# Patient Record
Sex: Female | Born: 1935 | Race: White | Hispanic: No | Smoking: Former smoker
Health system: Southern US, Community
[De-identification: ages and names within clinical notes are randomized; demographics above are authoritative.]

## PROBLEM LIST (undated history)

## (undated) DIAGNOSIS — Z711 Person with feared health complaint in whom no diagnosis is made: Secondary | ICD-10-CM

## (undated) DIAGNOSIS — R0602 Shortness of breath: Secondary | ICD-10-CM

## (undated) DIAGNOSIS — N189 Chronic kidney disease, unspecified: Secondary | ICD-10-CM

## (undated) DIAGNOSIS — K219 Gastro-esophageal reflux disease without esophagitis: Secondary | ICD-10-CM

## (undated) DIAGNOSIS — D649 Anemia, unspecified: Secondary | ICD-10-CM

## (undated) DIAGNOSIS — F32A Depression, unspecified: Secondary | ICD-10-CM

## (undated) DIAGNOSIS — E876 Hypokalemia: Secondary | ICD-10-CM

## (undated) DIAGNOSIS — K8020 Calculus of gallbladder without cholecystitis without obstruction: Secondary | ICD-10-CM

## (undated) DIAGNOSIS — F419 Anxiety disorder, unspecified: Secondary | ICD-10-CM

## (undated) DIAGNOSIS — F329 Major depressive disorder, single episode, unspecified: Secondary | ICD-10-CM

## (undated) DIAGNOSIS — N200 Calculus of kidney: Secondary | ICD-10-CM

## (undated) DIAGNOSIS — E860 Dehydration: Secondary | ICD-10-CM

## (undated) DIAGNOSIS — F0390 Unspecified dementia without behavioral disturbance: Secondary | ICD-10-CM

## (undated) DIAGNOSIS — K5790 Diverticulosis of intestine, part unspecified, without perforation or abscess without bleeding: Secondary | ICD-10-CM

## (undated) DIAGNOSIS — I10 Essential (primary) hypertension: Secondary | ICD-10-CM

## (undated) DIAGNOSIS — E785 Hyperlipidemia, unspecified: Secondary | ICD-10-CM

## (undated) DIAGNOSIS — R1310 Dysphagia, unspecified: Secondary | ICD-10-CM

## (undated) DIAGNOSIS — J449 Chronic obstructive pulmonary disease, unspecified: Secondary | ICD-10-CM

## (undated) HISTORY — DX: Diverticulosis of intestine, part unspecified, without perforation or abscess without bleeding: K57.90

## (undated) HISTORY — DX: Calculus of gallbladder without cholecystitis without obstruction: K80.20

## (undated) HISTORY — PX: ABDOMINAL HYSTERECTOMY: SHX81

---

## 2002-01-29 ENCOUNTER — Encounter: Payer: Self-pay | Admitting: Emergency Medicine

## 2002-01-29 ENCOUNTER — Emergency Department (HOSPITAL_COMMUNITY): Admission: EM | Admit: 2002-01-29 | Discharge: 2002-01-29 | Payer: Self-pay | Admitting: Emergency Medicine

## 2003-08-02 ENCOUNTER — Ambulatory Visit (HOSPITAL_COMMUNITY): Admission: RE | Admit: 2003-08-02 | Discharge: 2003-08-02 | Payer: Self-pay | Admitting: Pulmonary Disease

## 2003-08-10 ENCOUNTER — Ambulatory Visit (HOSPITAL_COMMUNITY): Admission: RE | Admit: 2003-08-10 | Discharge: 2003-08-10 | Payer: Self-pay | Admitting: Pulmonary Disease

## 2003-12-22 ENCOUNTER — Emergency Department (HOSPITAL_COMMUNITY): Admission: EM | Admit: 2003-12-22 | Discharge: 2003-12-22 | Payer: Self-pay | Admitting: Emergency Medicine

## 2004-05-06 ENCOUNTER — Emergency Department (HOSPITAL_COMMUNITY): Admission: EM | Admit: 2004-05-06 | Discharge: 2004-05-06 | Payer: Self-pay | Admitting: Emergency Medicine

## 2004-09-04 ENCOUNTER — Ambulatory Visit (HOSPITAL_COMMUNITY): Admission: RE | Admit: 2004-09-04 | Discharge: 2004-09-04 | Payer: Self-pay | Admitting: Pulmonary Disease

## 2004-10-04 ENCOUNTER — Emergency Department (HOSPITAL_COMMUNITY): Admission: EM | Admit: 2004-10-04 | Discharge: 2004-10-04 | Payer: Self-pay | Admitting: Emergency Medicine

## 2004-12-12 ENCOUNTER — Ambulatory Visit (HOSPITAL_COMMUNITY): Admission: RE | Admit: 2004-12-12 | Discharge: 2004-12-12 | Payer: Self-pay | Admitting: Pulmonary Disease

## 2005-01-07 ENCOUNTER — Ambulatory Visit (HOSPITAL_COMMUNITY): Admission: RE | Admit: 2005-01-07 | Discharge: 2005-01-07 | Payer: Self-pay | Admitting: Pulmonary Disease

## 2005-02-21 ENCOUNTER — Ambulatory Visit: Payer: Self-pay | Admitting: Orthopedic Surgery

## 2005-03-11 ENCOUNTER — Ambulatory Visit: Payer: Self-pay | Admitting: Orthopedic Surgery

## 2005-03-14 ENCOUNTER — Inpatient Hospital Stay (HOSPITAL_COMMUNITY): Admission: EM | Admit: 2005-03-14 | Discharge: 2005-03-22 | Payer: Self-pay | Admitting: Emergency Medicine

## 2005-03-18 ENCOUNTER — Encounter: Payer: Self-pay | Admitting: Pulmonary Disease

## 2005-03-19 HISTORY — PX: ESOPHAGOGASTRODUODENOSCOPY: SHX1529

## 2005-03-20 ENCOUNTER — Ambulatory Visit: Payer: Self-pay | Admitting: *Deleted

## 2005-03-22 ENCOUNTER — Ambulatory Visit: Payer: Self-pay | Admitting: Internal Medicine

## 2005-04-04 ENCOUNTER — Ambulatory Visit: Payer: Self-pay | Admitting: Orthopedic Surgery

## 2005-04-12 ENCOUNTER — Ambulatory Visit (HOSPITAL_COMMUNITY): Admission: RE | Admit: 2005-04-12 | Discharge: 2005-04-12 | Payer: Self-pay | Admitting: Orthopedic Surgery

## 2005-04-12 ENCOUNTER — Ambulatory Visit: Payer: Self-pay | Admitting: Orthopedic Surgery

## 2005-04-15 ENCOUNTER — Ambulatory Visit: Payer: Self-pay | Admitting: Orthopedic Surgery

## 2005-05-06 ENCOUNTER — Ambulatory Visit: Payer: Self-pay | Admitting: Orthopedic Surgery

## 2005-06-26 ENCOUNTER — Ambulatory Visit: Payer: Self-pay | Admitting: Orthopedic Surgery

## 2005-09-12 ENCOUNTER — Ambulatory Visit: Payer: Self-pay | Admitting: Orthopedic Surgery

## 2006-05-27 HISTORY — PX: CYSTOSCOPY: SUR368

## 2006-09-24 ENCOUNTER — Inpatient Hospital Stay (HOSPITAL_COMMUNITY): Admission: AD | Admit: 2006-09-24 | Discharge: 2006-09-29 | Payer: Self-pay | Admitting: Internal Medicine

## 2007-01-03 ENCOUNTER — Emergency Department (HOSPITAL_COMMUNITY): Admission: EM | Admit: 2007-01-03 | Discharge: 2007-01-03 | Payer: Self-pay | Admitting: Emergency Medicine

## 2007-01-08 ENCOUNTER — Inpatient Hospital Stay (HOSPITAL_COMMUNITY): Admission: EM | Admit: 2007-01-08 | Discharge: 2007-01-13 | Payer: Self-pay | Admitting: Emergency Medicine

## 2007-02-06 ENCOUNTER — Observation Stay (HOSPITAL_COMMUNITY): Admission: RE | Admit: 2007-02-06 | Discharge: 2007-02-07 | Payer: Self-pay | Admitting: Urology

## 2007-02-06 ENCOUNTER — Ambulatory Visit: Payer: Self-pay | Admitting: Cardiology

## 2009-06-10 ENCOUNTER — Emergency Department (HOSPITAL_COMMUNITY): Admission: EM | Admit: 2009-06-10 | Discharge: 2009-06-10 | Payer: Self-pay | Admitting: Emergency Medicine

## 2009-06-22 ENCOUNTER — Inpatient Hospital Stay (HOSPITAL_COMMUNITY): Admission: EM | Admit: 2009-06-22 | Discharge: 2009-06-26 | Payer: Self-pay | Admitting: Emergency Medicine

## 2010-06-16 ENCOUNTER — Encounter: Payer: Self-pay | Admitting: Orthopedic Surgery

## 2010-08-12 LAB — CBC
HCT: 30.3 % — ABNORMAL LOW (ref 36.0–46.0)
HCT: 34.6 % — ABNORMAL LOW (ref 36.0–46.0)
MCHC: 33.6 g/dL (ref 30.0–36.0)
MCV: 84.8 fL (ref 78.0–100.0)
MCV: 85.7 fL (ref 78.0–100.0)
Platelets: 281 10*3/uL (ref 150–400)
Platelets: 328 10*3/uL (ref 150–400)
RBC: 4.18 MIL/uL (ref 3.87–5.11)
RDW: 14.5 % (ref 11.5–15.5)
WBC: 6.9 10*3/uL (ref 4.0–10.5)
WBC: 8.6 10*3/uL (ref 4.0–10.5)

## 2010-08-12 LAB — GLUCOSE, CAPILLARY
Glucose-Capillary: 104 mg/dL — ABNORMAL HIGH (ref 70–99)
Glucose-Capillary: 134 mg/dL — ABNORMAL HIGH (ref 70–99)
Glucose-Capillary: 149 mg/dL — ABNORMAL HIGH (ref 70–99)
Glucose-Capillary: 79 mg/dL (ref 70–99)
Glucose-Capillary: 82 mg/dL (ref 70–99)
Glucose-Capillary: 85 mg/dL (ref 70–99)
Glucose-Capillary: 87 mg/dL (ref 70–99)
Glucose-Capillary: 88 mg/dL (ref 70–99)
Glucose-Capillary: 97 mg/dL (ref 70–99)

## 2010-08-12 LAB — BASIC METABOLIC PANEL
BUN: 10 mg/dL (ref 6–23)
BUN: 12 mg/dL (ref 6–23)
BUN: 14 mg/dL (ref 6–23)
BUN: 26 mg/dL — ABNORMAL HIGH (ref 6–23)
CO2: 22 mEq/L (ref 19–32)
CO2: 22 mEq/L (ref 19–32)
Calcium: 8.7 mg/dL (ref 8.4–10.5)
Calcium: 9.6 mg/dL (ref 8.4–10.5)
Chloride: 110 mEq/L (ref 96–112)
Chloride: 111 mEq/L (ref 96–112)
Creatinine, Ser: 1.48 mg/dL — ABNORMAL HIGH (ref 0.4–1.2)
Creatinine, Ser: 2.96 mg/dL — ABNORMAL HIGH (ref 0.4–1.2)
GFR calc Af Amer: 19 mL/min — ABNORMAL LOW (ref >60)
GFR calc non Af Amer: 30 mL/min — ABNORMAL LOW (ref >60)
GFR calc non Af Amer: 34 mL/min — ABNORMAL LOW (ref >60)
Glucose, Bld: 83 mg/dL (ref 70–99)
Glucose, Bld: 95 mg/dL (ref 70–99)
Potassium: 4.2 mEq/L (ref 3.5–5.1)
Potassium: 4.5 mEq/L (ref 3.5–5.1)
Potassium: 4.5 mEq/L (ref 3.5–5.1)

## 2010-08-12 LAB — URINE MICROSCOPIC-ADD ON

## 2010-08-12 LAB — POCT CARDIAC MARKERS
CKMB, poc: 1.5 ng/mL (ref 1.0–8.0)
Myoglobin, poc: 357 ng/mL (ref 12–200)

## 2010-08-12 LAB — URINE CULTURE: Colony Count: NO GROWTH

## 2010-08-12 LAB — DIFFERENTIAL
Basophils Absolute: 0 10*3/uL (ref 0.0–0.1)
Basophils Relative: 0 % (ref 0–1)
Eosinophils Absolute: 0 10*3/uL (ref 0.0–0.7)
Eosinophils Absolute: 0 10*3/uL (ref 0.0–0.7)
Eosinophils Relative: 0 % (ref 0–5)
Eosinophils Relative: 0 % (ref 0–5)
Lymphocytes Relative: 25 % (ref 12–46)
Lymphs Abs: 2.1 10*3/uL (ref 0.7–4.0)
Lymphs Abs: 2.4 10*3/uL (ref 0.7–4.0)
Monocytes Relative: 10 % (ref 3–12)
Monocytes Relative: 9 % (ref 3–12)
Neutro Abs: 5.7 10*3/uL (ref 1.7–7.7)
Neutrophils Relative %: 66 % (ref 43–77)

## 2010-08-12 LAB — URINALYSIS, ROUTINE W REFLEX MICROSCOPIC
Bilirubin Urine: NEGATIVE
Glucose, UA: NEGATIVE mg/dL
Ketones, ur: NEGATIVE mg/dL
Ketones, ur: NEGATIVE mg/dL
Nitrite: POSITIVE — AB
Nitrite: NEGATIVE
Protein, ur: 100 mg/dL — AB
Specific Gravity, Urine: 1.02 (ref 1.005–1.030)
Urobilinogen, UA: 0.2 mg/dL (ref 0.0–1.0)
Urobilinogen, UA: 0.2 mg/dL (ref 0.0–1.0)

## 2010-08-12 LAB — PHOSPHORUS: Phosphorus: 3.1 mg/dL (ref 2.3–4.6)

## 2010-10-09 NOTE — Consult Note (Signed)
NAME:  [name], [name]                ACCOUNT NO.:  [id number]   MEDICAL RECORD NO.:  [id number]          PATIENT TYPE:  INP   LOCATION:  A225                          FACILITY:  [facility]   PHYSICIAN:  Gerrit Friends. Dietrich Pates, MD, FACCDATE OF BIRTH:  [birth year]   DATE OF CONSULTATION:  02/06/2007  DATE OF DISCHARGE:                                 CONSULTATION   REFERRING PHYSICIAN:  Dr. Felecia Shelling.   PRIMARY CARDIOLOGIST:  Previously Dr. Dorethea Clan.   HISTORY OF PRESENT ILLNESS:  A 77 year old woman with known conduction  system disease referred for evaluation of brady arrhythmias prior to an  subsequent to extraction of a ureteral stone.   [name] [name] was evaluated 2 years ago in the setting of pulmonary  embolism.  She was found to have bifascicular block at that time and  developed increased AV block in the hospital.  There was no prior  cardiac history.  An echocardiogram was unremarkable.  Myocardial  infarction was ruled out.  Ultimately, her rhythm stabilized, and she  was not thought to require permanent pacing.   Since that time, she has resided in of [facility]. She walks with assistance  with a walker.  She has complained of some dizziness, particularly since  being told of diabetes in recent months, but has had no syncopal spells.   She was recently admitted with a urinary tract infection and found to  have obstruction related to a ureteral stone.  A stent was placed.  She  was brought in today for stone extraction.  She developed second-degree  AV block with 2:1 conduction prior to her procedure.  Atropine was given  with improvement in her rhythm.  She subsequently had spinal anesthesia  and surgery. Mobitz type 1 second-degree AV block was noted during the  procedure and in the recovery room.  There was relative hypotension at  times. At present, the patient feels well.   PAST MEDICAL HISTORY:  Notable for dementia, depression, COPD and CVA.  Prior surgery includes an appendectomy,  bilateral cataract extraction,  remote total abdominal hysterectomy and recent arthroscopic knee  surgery.  She has had hypertension, GERD and arthritic discomfort,  particularly in the knees.  Pulmonary embolus occurred 2 years ago as  noted above.   CURRENT MEDICATIONS:  1. Atrovent and Proventil by nebulizer.  2. Protonix 40 mg daily.  3. Lexapro 10 mg daily.  4. Lisinopril 20 mg daily.  5. Oxybutynin 10 mg daily.  6. Senokot 2 tablets daily.  7. Namenda 10 mg daily.  8. Aricept 10 mg daily.  9. Endocet 25 mg b.i.d.  10.Lantus insulin 10 units daily.  11.Septra DS b.i.d.   SOCIAL HISTORY:  No history of alcohol use; there is no history of left  tobacco use.   FAMILY HISTORY:  No prominent cardiac disease.   REVIEW OF SYSTEMS:  Notable for dry skin with some near decubitus ulcers  due to prolonged bedrest, GERD symptoms, incontinence of stool, a remote  history of GI bleed, a diabetic diet, a history of urinary incontinence,  anemia, the need for corrective lenses for near  vision and upper  dentures.  All other systems reviewed and are negative.   PHYSICAL EXAMINATION:  Pleasant but confused woman who does know that  she is in the hospital in no acute distress.  The temperature is 97.6, heart rate 74 and regular, respirations 16,  blood pressure 120/70, O2 saturation 97% on 2 liters.  Weight is 86.3  kg.  HEENT:  EOMs full; pupils equal, round, and react to light; normal oral  mucosa.  NECK:  No jugular venous distention; no carotid bruits.  ENDOCRINE:  No thyromegaly.  HEMATOPOIETIC:  No adenopathy.  LUNGS:  Clear.  SKIN:  No significant lesions.  CARDIAC:  Normal first and second heart sounds; some irregularity;  fourth heart sound present.  ABDOMEN:  Soft and nontender; normal bowel sounds; no organomegaly.  EXTREMITIES:  1/2+ ankle edema; distal pulses intact.  NEUROMUSCULAR:  Symmetric strength and tone; normal cranial nerves;  tremor, predominately of the upper  extremities.   EKG shows normal sinus rhythm, first-degree AV block and right bundle  branch block.   Echocardiogram was last performed in August of this year. The indication  was pedal edema and cardiomegaly. There was moderate LVH with preserved  left ventricular systolic function. No significant valvular  abnormalities were identified.   Other laboratory notable for normal CBC, normal chemistry profile except  for a glucose of 130 and a creatinine of 1.35 and a normal calcium.  Chest x-ray in August showed cardiomegaly and a decrease in lung  volumes.  The radiologist thought that mild pulmonary edema was present.   IMPRESSION:  [name] [name] has conduction system disease with significant  AV block during the stress of surgery. Preprocedure medication, pain or  other factors could have contributed to increased vagal tone and the  emergence of a heart rate of 30.  In the absence of suggestive symptoms  when she is not under stress, I am not inclined to recommend pacing at  this time, although she may well require it in the future.  She can be  observed overnight.  Atropine can be administered for recurrent  bradycardia.  If she has no additional significant arrhythmias, she can  be  discharged in the morning.  I discussed with her family the possibility  of additional conduction system problems in the future and the need to  call me should she develop significant dizziness, or loss of  consciousness or other cardiac issues.  Thank so much for allowing our  group to become reinvolved in this nice woman's care.      Gerrit Friends. Dietrich Pates, MD, [facility]  Electronically Signed     RMR/MEDQ  D:  02/06/2007  T:  02/06/2007  Job:  [id number]

## 2010-10-09 NOTE — H&P (Signed)
NAME:  [name] [name], [name] [name]                ACCOUNT NO.:  [id number]   MEDICAL RECORD NO.:  [id number]          PATIENT TYPE:  INP   LOCATION:  A318                          FACILITY:  [facility]   PHYSICIAN:  Tesfaye D. Felecia Shelling, MD   DATE OF BIRTH:  [birth year]   DATE OF ADMISSION:  01/07/2007  DATE OF DISCHARGE:  LH                              HISTORY & PHYSICAL   CHIEF COMPLAINT:  Fever.   HISTORY OF PRESENT ILLNESS:  This is 77 year old female resident of  nursing home, who was brought to emergency room with fever of 102  degrees Fahrenheit.  The patient was recently found to have E. coli UTI  which was resistant to Cipro.  The patient was started on Septra DS one  tablet p.o. b.i.d.  She has been in her usual state of health until  yesterday afternoon when the patient started spiking fever.  She was  brought to emergency room and the patient was evaluated.  She was  started on Rocephin and gentamicin.  The patient was admitted for  further treatment.  The patient is a poor historian and is unable to  give details of history.  She does not remember much about was happened  yesterday.   REVIEW OF SYSTEMS:  The patient feels better now.  No headache, any  chills, cough, nausea, vomiting, abdominal pain, dysuria, urgency or  frequency of urination.   PAST MEDICAL HISTORY:  1. Chronic obstructive pulmonary disease.  2. Hypertension.  3. Pneumonia.  4. History of pulmonary embolism.  5. Dementia.  6. Depression disorder.  7. Anemia.  8. History of gastroesophageal reflux disease.  9. History of second-degree atrioventricular block.  10.History of right knee chronic pain.   CURRENT MEDICATIONS:  1. Atrovent and Proventil nebulizer q.6h. p.r.n.  2. Protonix 40 mg p.o. daily.  3. Lexapro 10 mg p.o. daily.  4. Lisinopril 20 mg p.o. daily.  5. Oxybutynin 10 mg p.o. daily.  6. Senokot two tablets p.o. daily.  7. Namenda 10 mg p.o. daily.  8. Aricept 10 mg p.o. daily.  9. Endocet 25 mg  b.i.d.  10.Lantus insulin 10 units subcu nightly.  11.Septra DS one tablet b.i.d.  12.Accu-Chek with sliding scale coverage.   SOCIAL HISTORY:  The patient is currently resident of [facility].  No history of alcohol, tobacco or substance abuse.   PHYSICAL EXAMINATION:  GENERAL APPEARANCE:  The patient is alert, awake  and sick looking with.  VITAL SIGNS:  On admission, temperature 101.8 degrees Fahrenheit, blood  pressure 186/74, pulse 127, respiratory rate 28.  HEENT:  Pupils are equal and reactive.  NECK:  Supple.  CHEST:  Decreased air entry, bilateral rhonchi.  CARDIOVASCULAR:  First and second heart sound heard.  No murmur, no  gallop.  ABDOMEN:  Soft and lax.  Bowel sounds positive.  No mass or  organomegaly.  EXTREMITIES:  No leg edema.   ADMISSION LABORATORY DATA:  CBC with WBC 13.8, hemoglobin 12.5,  hematocrit 37.3, platelet 296.  BMP with sodium 131, potassium 4.4,  chloride 99, carbon dioxide 20, glucose 296, BUN  26, creatinine 3.19 and  calcium 8.5.   ASSESSMENT:  1. Fever, could be secondary to urinary tract infection, however, will      rule out sepsis.  2. Acute renal insufficiency, etiology not clear, probably may be      secondary to nonsteroidal anti-inflammatory medications.  3. History of chronic obstructive pulmonary disease.  4. Dementia.  5. Depression disorder.  6. Hypertension.  7. Diabetes mellitus.   PLAN:  Will continue the patient on IV Rocephin.  Will __________  gentamicin.  Will do renal ultrasound.  We will continue to monitor her  BMP and intake and output.  Will do nephrology consult for further  evaluation of her renal insufficiency.      Tesfaye D. Felecia Shelling, MD  Electronically Signed     TDF/MEDQ  D:  01/08/2007  T:  01/08/2007  Job:  [id number]

## 2010-10-09 NOTE — Op Note (Signed)
NAME:  [name] [name], [name] [name]                ACCOUNT NO.:  [id number]   MEDICAL RECORD NO.:  [id number]          PATIENT TYPE:  INP   LOCATION:  A318                          FACILITY:  [facility]   PHYSICIAN:  Ky Barban, M.D.DATE OF BIRTH:  04-04-1929   DATE OF PROCEDURE:  DATE OF DISCHARGE:                               OPERATIVE REPORT   PREOPERATIVE DIAGNOSES:  1. Renal failure.  2. Right ureteral and renal calculus.   POSTOPERATIVE DIAGNOSES:  1. Renal failure.  2. Right ureteral and renal calculus.   PROCEDURES:  1. Cystoscopy.  2. Right retrograde pyelogram.  3. Insertion of double-J stent.   ANESTHESIA:  Spinal.   PROCEDURE:  The patient under spinal anesthesia in lithotomy position,  after the usual prep and drape, a #25 cystoscope was introduced into the  bladder.  It was inspected.  The right ureteral orifice was catheterized  with open-end catheter.  I tried to pass the catheter but it hangs up  just below the common iliac vessels so I decided to inject the dye.  The  dye is injected and there is a filling defect.  There is a stone at that  point.  The dye goes up above the filling defect.  The ureter is  markedly dilated.  A guidewire is passed and it went up into the renal  pelvis over the guidewire.  I was able to slide the catheter in the  renal pelvis.  The guidewire is removed.  I can see hydronephrotic drip.  Guidewire is reintroduced and the open-end catheter is removed.  Over  the guidewire 5 x 24 cm double-J stent is positioned, no string is  attached, and it was positioned between the renal pelvis and the  bladder.  Guidewire is removed.  The patient left the operating room in  satisfactory condition.      Ky Barban, M.D.  Electronically Signed     MIJ/MEDQ  D:  01/12/2007  T:  01/13/2007  Job:  [id number]

## 2010-10-09 NOTE — Consult Note (Signed)
NAMENASTEHO, GLANTZ                ACCOUNT NO.:  [id number]   MEDICAL RECORD NO.:  [id number]          PATIENT TYPE:  INP   LOCATION:  A318                          FACILITY:  [facility]   PHYSICIAN:  Cyndra Numbers, MD DATE OF BIRTH:  [birth year]   DATE OF CONSULTATION:  01/08/2007  DATE OF DISCHARGE:                                 CONSULTATION   REFERRING PHYSICIAN:  Tesfaye D. Felecia Shelling, MD   [name] [name] is a 77 year old white female on whom we are asked to  consult for evaluation of renal insufficiency.   This is a patient who is a long-term resident of a nursing home with  chronic apparently Alzheimer's-type dementia, who was admitted to the  hospital with nausea and vomiting, poor p.o. intake and back pain for  unclear duration.  The patient is unable to give much history.  She  states that she is feeling better today, but is unable to quantitate  what her problems were prior to admission.   The patient was found on admission to have a creatinine of 3.19.  Her  baseline creatinine appears to be in the range of 1.5-1.6 as of May of  this year.   Apparently prior to admission, he was being treated for a urinary tract  infection with Bactrim and was also on Indocin long term, indication  unknown.  She also was maintained on an Ace inhibitor prior to  admission.  Note is made that her urinary specific gravity prior to  admission was greater than 1.030, although her blood pressure was well  preserved in the emergency room.   The patient has not received radiocontrast dye studies, aminoglycoside  antibiotics or other easily identified nephrotoxin except for her  Indocin, ACE inhibitor, and Bactrim use as stated above.   PAST MEDICAL HISTORY:  1. Chronic Alzheimer's-type dementia.  2. COPD.  3. History of acute pulmonary embolus occurring back in October 2006,      at which time she underwent IVC filter placement after bleeding on      acute heparin therapy.  4. History of  secondary AV block.  5. Depression.  6. Anemia.  7. Renal insufficiency.   The details of the patient's anemia are unclear.  Her hemoglobin on  admission this time was 12.5.   ALLERGIES:  According to the patient's emergency room records, she has  no known drug allergies.   MEDICATIONS AT HOME:  1. Protonix 40 mg a day.  2. Lexapro 10 mg daily.  3. Lisinopril 20 mg daily.  4. Oxybutynin 5 mg once a day.  5. Senokot 2 tablets once a day.  6. Namenda 1 tablet twice daily.  7. Albuterol inhaler.  8. Aricept 10 mg once a day.  9. Indocin 25 mg twice a day.  10.Cipro 250 mg twice a day prior to January 04, 2007.  11.Bactrim DS one orally twice daily.  12.Pyridium at an unknown dose.  13.O2 as needed.   PERSONAL HISTORY:  No tobacco or alcohol use recently.  She is a nursing  home resident, and she is unable to tell me  whether she smoked or drank  in the past.   FAMILY HISTORY:  Unobtainable.   REVIEW OF SYSTEMS:  At this time, this is largely unobtainable.  The  patient states that she is not having any breathing difficulties.  She  states that she is not hurting in her back.  She states she is not  hurting anywhere.  She is unable to quantitate her nausea and vomiting  but states that she is not sick this morning.   PHYSICAL EXAMINATION:  GENERAL:  At this time, reveals a well-developed,  moderately obese white female, actually appearing somewhat younger than  her stated age in no acute distress.  VITAL SIGNS:  This morning revealed blood pressure 114/59.  Respirations  were unlabored at 26.  Pulse is 92.  Temperature is 98.5.  HEENT Exam:  Revealed an atraumatic cranium.  The patient was anicteric.  Buccal mucosa was only mildly dry.  Funduscopic exam was not performed.  NECK:  Supple without JVD or lymphadenopathy.  No carotid bruits.  CHEST:  Clear to auscultation and percussion.  She had a regular heart  with no murmurs, gallops, or rubs appreciated.  ABDOMEN:  Soft.   Bowel sounds were normal.  She did have some tenderness  to palpation deeply in the upper abdomen, right upper quadrant more than  left upper quadrant.  GENITOURINARY:  No bladder distention was percussible.  She had a Foley  catheter in place.  EXTREMITIES:  Remarkable for no appreciable edema.  Feet were warm with  positive palpable pulses.  NEUROLOGIC:  The patient was seen to move all four, but she had a  preference to moving her left side.  She seemed to have no definable  weakness on her right extremities, however.  Her mentation was oriented  to hospital only.  She recognized me as the doctor, but did not know the  day or why she was in the hospital.   LABORATORY DATA:  Reviewed and essentially as stated above.   ASSESSMENT/PLAN:  1. Acute renal failure which is multifactorial including volume      contraction, Indocin use, ACE inhibitor use, and Bactrim use.      Expect she will have improvement and probable resolution of acute      renal failure with clinical stabilization and discontinuation of      nephrotoxic medications.  2. Nausea and vomiting with plus/minus right upper quadrant tenderness      on physical exam.  We will check LFTs and might consider the      possibility of right upper quadrant biliary ultrasound.  3. Organic brain syndrome with dementia.  4. History of pulmonary embolus, status post IVC filter.  The patient      is currently on Lovenox therapy.  5. History of GI bleed.  6. History of COPD.   I would like to thank Dr. Felecia Shelling very kindly for asking [place] to see this  patient, and we will continue to follow during her hospitalization and  make further suggestion.           ______________________________  Cyndra Numbers, MD     WRB/MEDQ  D:  01/08/2007  T:  01/09/2007  Job:  [id number]   cc:   Tesfaye D. Felecia Shelling, MD  Fax: [phone]   [facility]

## 2010-10-09 NOTE — Discharge Summary (Signed)
NAME:  [name] [name], [name] [name]                ACCOUNT NO.:  [id number]   MEDICAL RECORD NO.:  [id number]          PATIENT TYPE:  INP   LOCATION:  A318                          FACILITY:  [facility]   PHYSICIAN:  Tesfaye D. Felecia Shelling, MD   DATE OF BIRTH:  Apr 19, 1929   DATE OF ADMISSION:  01/07/2007  DATE OF DISCHARGE:  08/19/2008LH                               DISCHARGE SUMMARY   DISCHARGE DIAGNOSES:  1. Urinary tract infection.  2. Right ureteral calculi with hydronephrosis.  3. Acute renal failure secondary to the above.  4. History of chronic obstructive pulmonary disease.  5. Hypertension.  6. Dementia.  7. History of depression disorder.  8. History of pulmonary embolism.  9. History of pneumonia.  10.History of right knee chronic pain.  11.History of second-degree atrioventricular block.  12.History of gastroesophageal reflux disease.   DISCHARGE MEDICATIONS:  1. Protonix 40 mg p.o. daily.  2. Lexapro 10 mg p.o. daily.  3. Senna two tablets p.o. daily.  4. Namenda 10 mg b.i.d.  5. Aricept 10 mg daily.  6. Ditropan 10 mg p.o. daily.  7. Atrovent and Proventil nebulizer q.4 h. as needed.  8. Norvasc 5 mg daily  9. Darvocet-N 100 one tablet p.o. q.6 h. p.r.n. for pain.  10.Xanax 0.25 mg p.o. q.6 h. p.r.n.  11.Ambien 5 mg at bedtime p.r.n.  12.Lantus insulin 10 units subcu at bedtime.  13.Accu-Chek with sliding-scale coverage.   DISPOSITION:  The patient will be discharged to [facility] in  stable condition.   HOSPITAL COURSE:  This is a 77 year old female patient with a history of  multiple medical illnesses, who was brought to the emergency room with a  fever of 102 degrees Fahrenheit.  The patient was previously found for  E. coli UTI in the nursing home, for which she was being treated with  Septra DS one tablet p.o. b.i.d..  However, the patient continued to  spike fever.  She was evaluated and was admitted from the emergency  room.  The patient received IV Rocephin.   During the evaluation, the  patient was found to have acute renal failure.  Ultrasound of the  kidneys showed right ureteral calculi with hydronephrosis.  Urology  consult and nephrology consult were done.  The patient  underwent cystoscopy with a right ureteral stent placement.  The patient  improved.  Her renal function was also significantly improved.  On  discharge, her BUN was 24 with a creatinine of 2.08.  The patient is  back to her baseline.  She will be discharged back to a nursing home to  continue her regular treatment.      Tesfaye D. Felecia Shelling, MD  Electronically Signed     TDF/MEDQ  D:  01/13/2007  T:  01/13/2007  Job:  [id number]

## 2010-10-09 NOTE — Procedures (Signed)
NAME:  [name] [name], [name] [name]                ACCOUNT NO.:  [id number]   MEDICAL RECORD NO.:  [id number]          PATIENT TYPE:  INP   LOCATION:  A318                          FACILITY:  [facility]   PHYSICIAN:  Bevelyn Buckles. Bensimhon, MDDATE OF BIRTH:  [birth year]   DATE OF PROCEDURE:  01/09/2007  DATE OF DISCHARGE:                                ECHOCARDIOGRAM   INDICATIONS:  Lower extremity edema and cardiomegaly on chest x-ray.   This was a technically difficult study due to suboptimal sound wave  transmission.   Measurements:  LV diastole 3.5 cm.  LV systole 2.8 cm.  Aortic root 3.5  cm.  Left atrium 3.7 cm.  Septum is 2.2 cm.  Posterior wall is 1.7 cm.   Left ventricle:  There was normal LV size and function with ejection  fraction of 60-65%.  There is moderate concentric left ventricular  hypertrophy with moderate to severe focal basilar septal hypertrophy.  Study was inadequate for regional wall motion abnormalities.   Right ventricle:  Grossly normal.   Left atrium:  Normal size.   Right atrium:  Grossly normal.   Aortic valve:  Trileaflet was mildly thickened, but there was good  leaflet excursion.  There is no significant aortic stenosis.  No aortic  insufficiency.  There was a mean gradient across the aortic valve of 6  mmHg.   Mitral valve:  Structurally normal.  There is no mitral valve prolapse  visualized.  No significant mitral regurgitation or mitral stenosis.   Tricuspid valve was grossly normal.  There is trivial tricuspid  regurgitation.   Pulmonic valve was not well-visualized.  There is no evidence of  pulmonic insufficiency or pulmonic stenosis.   Pericardium:  There was no evidence of pericardial effusion.  There was  small epicardial fat pad.   CONCLUSION:  1. Normal left ventricular function.  Ejection fraction of 60-65%.  2. Moderate left ventricular hypertrophy with moderate to severe basal      septal hypertrophy.  3. Mildly thickened aortic valve without  significant aortic stenosis.      Bevelyn Buckles. Bensimhon, MD  Electronically Signed     DRB/MEDQ  D:  01/09/2007  T:  01/11/2007  Job:  [id number]

## 2010-10-09 NOTE — Op Note (Signed)
NAME:  [name], [name]                ACCOUNT NO.:  [id number]   MEDICAL RECORD NO.:  [id number]          PATIENT TYPE:  INP   LOCATION:  A225                          FACILITY:  [facility]   PHYSICIAN:  Ky Barban, M.D.DATE OF BIRTH:  1929/04/10   DATE OF PROCEDURE:  02/06/2007  DATE OF DISCHARGE:                               OPERATIVE REPORT   PREOPERATIVE DIAGNOSIS:  Right ureteral calculus.   POSTOPERATIVE DIAGNOSIS:  Right ureteral calculus.   PROCEDURE:  Cystoscopy, right ureteroscopic stone basket with holmium  laser lithotripsy, insertion of double-J stent size 5 French 24 cm - no  string attached.   ANESTHESIA:  Spinal.   PROCEDURE:  The patient under spinal anesthesia and placed in lithotomy  position, usual prep and drape.  A #25 cystoscope introduced into the  bladder.  The previously placed stent was grabbed with the rigid forceps  and brought out near the meatus.  At that point I inserted a guidewire  through the stent, passed up into the renal pelvis and the stent was  removed.  Guidewire was secured to the drapes, and short rigid  ureteroscope was introduced under direct vision alongside the guidewire,  went to the level of the stone.  Stone was visualized, and then using a  laser fiber, the stone was pulverized into smaller pieces, and then this  pieces were engaged in a basket.  Some of those pieces were further  broken with the engaged basket, and they were brought out completely on  several passes of the ureteroscope.  At the end, the entire ureter was  inspected and looks fine.  There was no residual stone.  The  ureteroscope was removed.  Cystoscope was introduced over the guidewire,  and a double-J stent was passed over the guidewire and positioned  between the renal pelvis and the bladder.  Guidewire and the string is  removed.  All the instruments were removed.  Stent was in good position.  The patient left the operating room in satisfactory  condition.      Ky Barban, M.D.  Electronically Signed     MIJ/MEDQ  D:  02/06/2007  T:  02/07/2007  Job:  [id number]

## 2010-10-09 NOTE — H&P (Signed)
NAME:  [name] [name], [name] [name]                ACCOUNT NO.:  [id number]   MEDICAL RECORD NO.:  [id number]          PATIENT TYPE:  AMB   LOCATION:                                FACILITY:  [facility]   PHYSICIAN:  Ky Barban, M.D.DATE OF BIRTH:  05-Feb-1929   DATE OF ADMISSION:  DATE OF DISCHARGE:  LH                              HISTORY & PHYSICAL   HISTORY:  This 77 year old female was in the hospital last month,  underwent a placement of double-J stent on the right side for ureteral  calculus which was causing obstruction, also renal failure.   HISTORY AND PHYSICAL:  Please use her recent history and physical for  his procedure.   ASSESSMENT/PLAN:  I have advised her to come as outpatient to undergo  stone basket chromium laser lithotripsy.  I had a meeting with her  family, daughter and sons came.  So I told them what is the problem,  that she has a stone in the ureter and also in the right kidney but I  need to remove the stone in the ureter because most likely that is only  causing obstruction.  The renal stone may not be causing obstruction  after I remove this, then we will see.  The procedure of stone basket is  discussed in detail, its possible complications especially ureteral  perforation leading to open surgery.  They understand and will go ahead  and proceed.      Ky Barban, M.D.  Electronically Signed     MIJ/MEDQ  D:  02/05/2007  T:  02/05/2007  Job:  [id number]

## 2010-10-09 NOTE — Consult Note (Signed)
NAME:  [name], [name]                ACCOUNT NO.:  [id number]   MEDICAL RECORD NO.:  [id number]          PATIENT TYPE:  INP   LOCATION:  A318                          FACILITY:  [facility]   PHYSICIAN:  Ky Barban, M.D.DATE OF BIRTH:  1929/04/09   DATE OF CONSULTATION:  01/10/2007  DATE OF DISCHARGE:                                 CONSULTATION   CHIEF COMPLAINT:  Right flank pain and fever.   HISTORY:  A 77 year old female who is a resident of a local nursing  home, brought into the emergency room with high fever and right flank  pain.  CT scan showed an 8 x 10-mm stone in the right upper ureter  causing hydronephrosis.  She also has a big stone in the right kidney.  The patient is admitted for further workup and management.  I was called  in to see her because her creatinine was up to 3.3.   PAST MEDICAL HISTORY:  She has multiple medical problems including COPD,  hypertension, pneumonia, history of pulmonary embolism, dementia,  depression, anemia, GE reflux, second degree AV block.   MEDICATION:  Atrovent, Protonix, Lexapro, lisinopril, oxybutynin,  Senokot, Namenda, Aricept, Endocet, insulin, and Septra DS.   EXAMINATION:  Moderately-built, not in acute distress.  Blood pressure  is 180/70, temperature is normal.  ABDOMEN:  Soft, flat.  Liver, spleen, kidneys were not palpable.  CVA  tenderness 1+.  PELVIC:  Deferred.   LABORATORY DATA:  Her sodium is 133, potassium 4.6, chloride 101, CO2 is  24, glucose 138, BUN 33, creatinine 3.3.  Wbc count is 13.8, hematocrit  37.3.  Urine and blood cultures are done.  She was started on gentamicin  plus Rocephin.  Later on when she was found to have poor renal function,  gentamicin was discontinued, still on Rocephin.   IMPRESSION:  1. Right renal and right ureteral calculus.  2. Insulin-dependent diabetes.  3. Hypertension.  4. Chronic obstructive pulmonary disease.  5. Possible urinary tract infection.   Urine culture and  blood cultures have been done and as the patient is  feeling better, she is afebrile, I will wait and see if the kidney  functions improve.  If they improve by tomorrow I will hold on doing  insertion of double-J stent.  I  can do it electively on Monday, but if the kidney functions are  deteriorating then I will go ahead and do it as an emergency on Sunday.   I appreciate Dr. Felecia Shelling for letting me see this patient.      Ky Barban, M.D.  Electronically Signed     MIJ/MEDQ  D:  01/11/2007  T:  01/12/2007  Job:  [id number]   cc:   Tesfaye D. Felecia Shelling, MD  Fax: [phone]

## 2010-10-12 NOTE — Group Therapy Note (Signed)
NAME:  [name] [name], [name] [name]                ACCOUNT NO.:  [id number]   MEDICAL RECORD NO.:  [id number]          PATIENT TYPE:  INP   LOCATION:  A208                          FACILITY:  [facility]   PHYSICIAN:  Edward L. Juanetta Gosling, M.D.DATE OF BIRTH:  28-Sep-1928   DATE OF PROCEDURE:  03/16/2005  DATE OF DISCHARGE:                                   PROGRESS NOTE   PROBLEM:  Possible pulmonary embolus.   SUBJECTIVE:  [name] [name] has a new problem and that is that she has had  some very dark loose stools that at least one of which is heme positive.   OBJECTIVE:  Her exam shows a temperature is 97.4, pulse is 84, respirations  20, blood pressure 166/91, O2 saturation 94%.   Her CBC now shows her hemoglobin is 10, down from 11.9 yesterday.  Prothrombin time is 15.5. Her BUN is down to 42 with a creatinine 1.3.   ASSESSMENT:  She is, I think, having probably a GI bleed. She has dropped  her hemoglobin. My plan is to hold her anticoagulation. I am not fully  convinced that she has had a pulmonary embolus. Her lung scan was  intermediate probability, and I am more concerned about her bleeding now. I  am going to go ahead and stop her anticoagulation for the moment, hopefully  will be able to have her get a CT which will be a much better test, and then  try to decide what we can do from there.      Edward L. Juanetta Gosling, M.D.  Electronically Signed     ELH/MEDQ  D:  03/16/2005  T:  03/17/2005  Job:  [id number]

## 2010-10-12 NOTE — Procedures (Signed)
NAME:  Tokunaga, Serina                ACCOUNT NO.:  [id number]   MEDICAL RECORD NO.:  [id number]          PATIENT TYPE:  INP   LOCATION:  A208                          FACILITY:  [facility]   PHYSICIAN:  Edward L. Juanetta Gosling, M.D.DATE OF BIRTH:  03-Jul-1928   DATE OF PROCEDURE:  03/20/2005  DATE OF DISCHARGE:                                EKG INTERPRETATION   The rhythm appears to be a second degree AV block of the Wenckebach type.  There are Q wave abnormalities which are consistent but not diagnostic of  ischemia.  Abnormal electrocardiogram.      Oneal Deputy. Juanetta Gosling, M.D.  Electronically Signed     ELH/MEDQ  D:  03/21/2005  T:  03/22/2005  Job:  [id number]

## 2010-10-12 NOTE — H&P (Signed)
NAME:  [name] [name], [name] [name]                ACCOUNT NO.:  [id number]   MEDICAL RECORD NO.:  [id number]          PATIENT TYPE:  AMB   LOCATION:  DAY                           FACILITY:  [facility]   PHYSICIAN:  Vickki Hearing, M.D.DATE OF BIRTH:  [birth year]   DATE OF ADMISSION:  04/11/2005  DATE OF DISCHARGE:  LH                                HISTORY & PHYSICAL   CHIEF COMPLAINT:  Right knee pain.   HISTORY OF PRESENT ILLNESS:  She is a 77 year old female with acute onset of  right knee pain and swelling described as severe, constant, dull, aching and  present for greater than 3 months.  Any motion caused increasing pain and  she had associated signs of swelling.   She has normal development, grooming, hygiene, nutrition and body habitus  large.  She is alert and oriented x3 with  normal sensation.  Pulses are  normal.  She has no venous statis.  Normal temperature and no edema in her  peripheral vascular tree.  Skin is normal.  She has no lymph nodes that are  positive.   She has 30-110 degree range of motion in the right knee with normal strength  and stability.  She has crepitance and swelling with positive meniscal sign.  She has no contractures, atrophy, luxations or malalignment in her upper  extremities.  Her x-rays from the hospital show no fracture, but an  effusion.  She eventually went on to have MRI of the right knee which showed  a complex tear of the mid body of the anterior horn and lateral meniscus.  There was also irregularity to medial femoral condyle with complex tear of  the medial meniscus and also full thickness cartilage lost throughout the  medial compartment.   ALLERGIES:  No known drug allergies.   PAST SURGICAL HISTORY:  1.  Appendectomy.  2.  Hysterectomy.   MEDICATIONS:  She takes medications which she said she would bring to the  hospital.   FAMILY HISTORY:  Negative.   PRIMARY CARE PHYSICIAN:  Edward L. Juanetta Gosling, M.D.   SOCIAL HISTORY:  She is  widowed.  She is a housewife.  She is retired.  She  smokes 1/2 pack of cigarettes per day.  She does not drink.  She drinks two  soft drinks a day.  Education through 7th grade.   REVIEW OF SYSTEMS:  CARDIOPULMONARY:  Positive for shortness of breath.  NEUROLOGIC:  Depression.  HEENT:  Glaucoma.  Other seven systems normal  except for as described in musculoskeletal.   PHYSICAL EXAMINATION:  VITAL SIGNS:  Weight 200, pulse 72, respirations 18.   ASSESSMENT:  Medial and lateral meniscal tears, possibly avascular necrosis  of the left knee and medial femoral condyle.   PLAN:  Plan for arthroscopy, meniscectomies and address condylar lesions as  required.      Vickki Hearing, M.D.  Electronically Signed     SEH/MEDQ  D:  04/11/2005  T:  04/11/2005  Job:  [id number]   cc:   [facility]

## 2010-10-12 NOTE — Consult Note (Signed)
NAME:  [name] [name], [name] [name]                ACCOUNT NO.:  [id number]   MEDICAL RECORD NO.:  [id number]          [name] TYPE:  INP   LOCATION:  A208                          FACILITY:  [facility]   PHYSICIAN:  R. Roetta Sessions, M.D. DATE OF BIRTH:  09-09-1928   DATE OF CONSULTATION:  03/18/2005  DATE OF DISCHARGE:                                   CONSULTATION   REASON FOR CONSULTATION:  GI bleed.   HISTORY OF PRESENT ILLNESS:  [name] [name] is a 77 year old Caucasian female  who presented to [name] emergency department via EMS with complaints of chest  pain.  She has dementia as well.  She had a positive D-dimer in [name] 5 range.  She had a VQ scan done which was indeterminate.  Because her creatinine was  elevated, she could not undergo a chest CT.  Her chest pain resolved.  She  was started on Lovenox and Coumadin for anticoagulation therapy.  Her INR  was up to 1.6 yesterday, which was [name] highest thus far.  Over [name] weekend,  she developed melena.  She developed this Friday evening which continued  through Saturday and Sunday.  On admission, her hemoglobin was 12.9,  hematocrit 37.9.  Her hemoglobin dropped down to 8.1 prior to transfusion.  She received 2 units of packed red blood cells.  Hemoglobin was up to 10.1,  hematocrit 29.5 post-transfusion, but today, her hemoglobin is back down to  9.3, hematocrit 27.1.  Her creatinine is better at 1.3.  Chest CT is planned  to exclude pulmonary embolus.  [name] [name] has been on Indomethacin as an  outpatient for knee pain, felt to possibly be due to gout.  According to [name]  family, she had also been taking Naprosyn.  Recently, Naprosyn was switched  to colchicine which is what [name] family has mentioned.  [name] [name] complains  of anorexia.  She denies any heartburn.  She has had some mild abdominal  pain.  No constipation, diarrhea.  She has occasional dysphagia.  She  vomited Saturday and had 50 cc of coffee-ground emesis.   MEDICATIONS PRIOR TO  ADMISSION:  1.  Namenda 10 mg b.i.d.  2.  Aricept 10 mg daily.  3.  Lexapro 10 mg daily.  4.  Indomethacin 75 mg daily.  5.  Colchicine 0.6 mg q.6h.  6.  Detrol LA 4 mg daily.   ALLERGIES:  NO KNOWN DRUG ALLERGIES.   PAST MEDICAL HISTORY:  1.  Dementia.  2.  Depression.  3.  COPD.  4.  History of stroke.  5.  Recent right knee pain, with no known injury.  6.  [name] reports prior appendectomy, but she is unsure of any other      surgeries.   FAMILY HISTORY:  She believes her father had cancer but does not know any  details.   SOCIAL HISTORY:  She recently has been living in  __________ but is  currently living with her son.  She is widowed and has three children.  She  quit smoking recently.  She denies any alcohol use.   REVIEW OF SYSTEMS:  See HPI for GI and for cardiopulmonary.   PHYSICAL EXAMINATION:  VITAL SIGNS:  Temperature 97.1, pulse 73,  respirations 20, blood pressure 144/81.  GENERAL:  Pleasant elderly Caucasian female in no acute distress.  She is  alert but has difficulties with recalling medical history  SKIN:  Warm and dry, no jaundice.  HEENT:  Conjunctivae are pale.  Sclerae nonicteric.  Oropharyngeal mucosa  moist and pink.  No lesions, erythema, or exudate.  NECK:  No lymphadenopathy, thyromegaly.  CHEST:  Decreased breath sounds in [name] base.  Otherwise, clear.  CARDIAC:  Regular rate and rhythm.  No murmurs, rubs, or gallops.  ABDOMEN:  Positive bowel sounds, obese but symmetrical.  Soft.  She has mild-  to-moderate epigastric tenderness and mild lower abdominal tenderness to  deep palpation.  No organomegaly or masses.  No rebound tenderness or  guarding.  No abdominal bruits or hernias.  EXTREMITIES:  No edema.  [name] right knee, however, is slightly warm to touch  and does have some swelling and is tender to palpation.   LABORATORY DATA:  As mentioned in [name] HPI.  In addition, white count 7900,  platelets 204,000.  INR today is 1.5.  MCV on  admission was 86.6.  BUN 27,  creatinine 1.3 (on admission was 3.2), sodium 135, potassium 3.8, glucose  90.  Cardiac enzymes negative x1.  BNP 73.6.  She had a renal ultrasound  which was negative.  She had bilateral lower extremity Dopplers which were  negative.  She had a right knee MRI which showed a complex tear of [name]  meniscus and an effusion and evidence of a prior intact fracture.   IMPRESSION:  [name] [name] is a 77 year old lady who has developed black heme-  positive stools with drop in her hemoglobin and hematocrit requiring  transfusion in [name] setting of anticoagulation for indeterminate pulm  embolus.  Anticoagulation has been held.  Her international normalized ratio  is normal.  She has a history of being on Indomethacin and possibly Naprosyn  and is at high risk for bleeding peptic ulcer disease.   RECOMMENDATIONS:  1.  Will begin IV Protonix.  2.  Continue to hold Coumadin and Lovenox therapy.  3.  EGD.  4.  It is unclear whether [name] [name] has had a colonoscopy in [name] past.  If      she has not, she needs to have one at some point, probably as an      outpatient.      Tana Coast, P.AJonathon Bellows, M.D.  Electronically Signed    LL/MEDQ  D:  03/18/2005  T:  03/18/2005  Job:  [id number]   cc:   Ramon Dredge L. Juanetta Gosling, M.D.  Fax: [phone]

## 2010-10-12 NOTE — Group Therapy Note (Signed)
NAME:  [name], [name]                ACCOUNT NO.:  [id number]   MEDICAL RECORD NO.:  [id number]          PATIENT TYPE:  INP   LOCATION:  A208                          FACILITY:  [facility]   PHYSICIAN:  Edward L. Juanetta Gosling, M.D.DATE OF BIRTH:  Mar 04, 1929   DATE OF PROCEDURE:  03/19/2005  DATE OF DISCHARGE:                                   PROGRESS NOTE   PROBLEMS:  1.  Pulmonary embolus.  2.  Gastrointestinal bleeding.   SUBJECTIVE:  Ms. [name] [name] had her CT scan which did show a pulmonary  embolus.  I sent her to [facility] where she had a vena caval filter placed  yesterday.  She is set for EGD today.   OBJECTIVE:  VITAL SIGNS:  Her physical examination this morning shows her  temperature is 97.1, pulse 67, respirations 16, blood pressure 122/65, O2  saturations 95%.  CHEST::  Her chest is clear.  HEART:  Her heart is regular.  ABDOMEN:  Abdomen is actually fairly soft.  EXTREMITIES:  No edema.   ASSESSMENT:  My assessment then is that she has multiple problems, a  gastrointestinal bleed as well as pulmonary embolus.  Hemoglobin this  morning is 8.7.  Her BMET this morning shows potassium is 3.5.  She is  better.   PLAN:  Continue treatments, medications and get the EGD done and go from  there.      Edward L. Juanetta Gosling, M.D.  Electronically Signed     ELH/MEDQ  D:  03/19/2005  T:  03/19/2005  Job:  [id number]

## 2010-10-12 NOTE — Group Therapy Note (Signed)
NAME:  [name], [name]                ACCOUNT NO.:  [id number]   MEDICAL RECORD NO.:  [id number]          PATIENT TYPE:  INP   LOCATION:  A208                          FACILITY:  [facility]   PHYSICIAN:  Edward L. Juanetta Gosling, M.D.DATE OF BIRTH:  1928/09/19   DATE OF PROCEDURE:  DATE OF DISCHARGE:                                   PROGRESS NOTE   PROBLEM:  GI bleeding, shingles, pulmonary embolus, dementia, severe  arthritis of the knee.   SUBJECTIVE:  Mrs. [name] [name] says she is feeling okay. She is still confused.  Yesterday she had an EGD done that showed that she did have peptic ulcer  disease, but also at that time she had developed a rash typical of shingles  on her back. The rash was not present the day prior.   Her exam this morning shows that she still has a rash. Temperature is 97.4,  pulse 69, respirations 20, blood pressure 133/59. O2 sat is 95% on room air.  Her chest is pretty clear. Knee is still tender. Her heart is regular.   Laboratory work shows hemoglobin 7.7, hematocrit 23.   ASSESSMENT:  She has still fairly marked anemia.   PLAN:  I am going to go ahead and give her two units of packed red blood  cells, continue with all of her other medicines and treatments, and she is  on Zovirax for her shingles. She will probably be ready for transfer to the  skilled care facility in about two days. I would like to make sure that her  hemoglobin remains stable after she gets this blood considering the fact  that she has dropped considerably over the last 24 hours.      Edward L. Juanetta Gosling, M.D.  Electronically Signed     ELH/MEDQ  D:  03/20/2005  T:  03/20/2005  Job:  [id number]

## 2010-10-12 NOTE — Group Therapy Note (Signed)
NAME:  [name] [name], [name] [name]                ACCOUNT NO.:  [id number]   MEDICAL RECORD NO.:  [id number]          PATIENT TYPE:  INP   LOCATION:  A208                          FACILITY:  [facility]   PHYSICIAN:  Edward L. Juanetta Gosling, M.D.DATE OF BIRTH:  Mar 18, 1929   DATE OF PROCEDURE:  03/17/2005  DATE OF DISCHARGE:                                   PROGRESS NOTE   SUBJECTIVE:  [name] did develop frank GI bleeding yesterday and is  receiving blood now.  As mentioned in my progress note yesterday, I do not  think that she has had a pulmonary embolus.  We are holding her  anticoagulation.  She is receiving blood.  She is confused as always.  She  still is complaining of knee pain and at some point will get Dr. Romeo Apple to  look at her again.  She did have her MRI done which showed significant  changes probably related to previous actual fracture.   OBJECTIVE:  VITAL SIGNS:  Her exam otherwise shows her temperature is 98.7,  pulse 103, respirations 20, blood pressure 133/65, O2 saturations 96% on  room air.  GENERAL:  As mentioned, she is confused.   Hemoglobin down to 8.1, but she is receiving blood.  Prothrombin time was  19.4 this morning.  I am going to go ahead and give her some Vitamin K.  Electrolytes show her BUN 49, creatinine 1.4.  I think some of that is  probably from blood in the gut.   ASSESSMENT:  She has multiple problems now including gastrointestinal  bleeding.  She has been anticoagulated and questionable pulmonary embolus.      Edward L. Juanetta Gosling, M.D.  Electronically Signed     ELH/MEDQ  D:  03/17/2005  T:  03/18/2005  Job:  [id number]

## 2010-10-12 NOTE — Group Therapy Note (Signed)
NAME:  Farve, Tabita                ACCOUNT NO.:  [id number]   MEDICAL RECORD NO.:  [id number]          PATIENT TYPE:  INP   LOCATION:  A208                          FACILITY:  [facility]   PHYSICIAN:  Edward L. Juanetta Gosling, M.D.DATE OF BIRTH:  15-Jun-1928   DATE OF PROCEDURE:  DATE OF DISCHARGE:                                   PROGRESS NOTE   Mrs. Baysinger has had bouts of what looks like a second-degree AV block with  every 5th to 10th beat being dropped. She is asymptomatic, and after a  telephone consultation with [facility], Dr. Myrtis Ser, he felt that if  she is asymptomatic and no higher grade block that she does not need further  evaluation at this point. If she develops high-grade block or becomes  symptomatic at that point she may need a pacemaker.      Edward L. Juanetta Gosling, M.D.  Electronically Signed     ELH/MEDQ  D:  03/20/2005  T:  03/20/2005  Job:  [id number]

## 2010-10-12 NOTE — Op Note (Signed)
NAME:  [name], [name]                ACCOUNT NO.:  [id number]   MEDICAL RECORD NO.:  [id number]          PATIENT TYPE:  AMB   LOCATION:  DAY                           FACILITY:  [facility]   PHYSICIAN:  Vickki Hearing, M.D.DATE OF BIRTH:  02/25/1929   DATE OF PROCEDURE:  04/12/2005  DATE OF DISCHARGE:                                 OPERATIVE REPORT   PREOPERATIVE DIAGNOSIS:  Osteoarthritis, right knee, with torn medial and  lateral menisci.   POSTOPERATIVE DIAGNOSIS:  Osteoarthritis, right knee, with medial and  lateral meniscal tears, loose body, synovitis.   PROCEDURE:  Arthroscopy, right knee, synovectomy, loose body removal, medial  and lateral meniscal tear, abrasion arthroplasty, medial compartment.   SURGEON:  Vickki Hearing, M.D.   ANESTHESIA:  Spinal.   OPERATIVE FINDINGS:  There was grade 4 degenerative disease of the medial  compartment. There was major synovitis of the knee. There were medial and  lateral meniscal tears. There was significant scar tissue, hemorrhage, and  injected synovium.   HISTORY:  This patient is 77 years old. The summer of 2006, injured her  knee. Also became ill from medical condition. Knee could not be addressed at  that time. She was brought to my office. MRI was ordered. It was delayed  because of her medication condition and eventually was taken care of, and  she presented back to the office with a flexion contracture about 20 to 30  degrees, and she was nonambulatory where she had been before.   The patient was identified in the preoperative holding area as [name] [name]. Right knee was marked for surgery, countersigned by the surgeon,  preoperative antibiotics were started. After history and physical was  updated, she was taken to the operating room for spinal anesthetic, placed  supine on the operating table. Right lower extremity was prepped with  DuraPrep and draped sterilely.   A time out was taken and completed. All  parameters as required.   A lateral portal was used to do a diagnostic arthroscopy in the three  compartments of the knee. Findings are as listed.   We started in the lateral compartment, resected a lateral meniscal tear,  used straight shaver to remove the meniscal fragments and balance the  meniscus to a stable rim which was confirmed with a probe.   The medial side was then addressed. We did a synovectomy, abrasion  arthroplasty with a 5-0 round bur, performed a medial meniscectomy, removed  a loose body, continued the synovectomy in the patellofemoral compartment as  well.   Irrigated the knee, closed with Steri-Strips, injected balance of 30 cc of  Sensorcaine with epinephrine, dressed the knee sterilely, placed the  CryoCuff, and the patient was taken to the recovery room in stable  condition.   She will return to the nursing home. Followup will be on Monday. She is full  weight bearing in a long leg brace. Physical therapy can start at the  nursing home on Tuesday.      Vickki Hearing, M.D.  Electronically Signed     SEH/MEDQ  D:  04/12/2005  T:  04/12/2005  Job:  [id number]

## 2010-10-12 NOTE — H&P (Signed)
NAME:  [name] [name], [name] [name]                ACCOUNT NO.:  [id number]   MEDICAL RECORD NO.:  [id number]          PATIENT TYPE:  OBV   LOCATION:  A208                          FACILITY:  [facility]   PHYSICIAN:  Edward L. Juanetta Gosling, M.D.DATE OF BIRTH:  07/10/1928   DATE OF ADMISSION:  03/13/2005  DATE OF DISCHARGE:  LH                                HISTORY & PHYSICAL   HISTORY:  [name] is a 77 year old who came to the emergency room last  night because of chest pain.  However, she is very demented and history is  very difficult to obtain.  She had work up which included a D-dimer which  was elevated, then she underwent a ventilation perfusion lung scan which is  indeterminate.  Because of that she has been brought in for anticoagulation.  She is very confused.  History is pretty much unobtainable.   PHYSICAL EXAMINATION:  GENERAL APPEARANCE:  Her physical examination this  morning shows that she is awake and alert but confused.  VITAL SIGNS:  Temperature 96.8, pulse 73, respirations 20, blood pressure  139/66, oxygen saturation not done yet.  HEENT: Her pupils are reactive. Mucous membranes are slightly dry.  CHEST:  Clear without wheezes, rales or rhonchi.  HEART:  Regular without murmurs, rubs or gallops.  ABDOMEN:  Soft.  EXTREMITIES:  Show no edema.  MUSCULOSKELETAL:  She is complaining of pain in her right knee.  Her right  knee is nontender, has full range of motion.  Pulses are intact.  CNS:  Shows that she is very confused.   LABORATORY DATA:  Last night her BNP was 73.  BMET showed potassium of 5.2,  BUN 72, creatinine 3.2.  CBC showed white count 9400, hemoglobin 12.9,  platelet count 236,000.  D-dimer was 5.6.  Cardiac markers showed CK 1.1.   FAMILY HISTORY:  Positive for hypertension.   SOCIAL HISTORY:  She is an ex-cigarette smoker, not currently smoking I  don't think.  She has lived in a nursing home. She has been with a son, she  is at home, I believe, although there are no  family members present so it is  difficult to tell for sure   MEDICATIONS:  Her medications at present are:  1.  Namenda 10 mg b.i.d.  2.  Aricept 10 mg daily.  3.  Lexapro 10 mg daily.  4.  She had been on Indomethacin.   PLAN:  The plan then is to get a venous study to try to see if she has a  clot in her leg.  We will go ahead and get a renal ultrasound to see how  that looks.  She is going to have some intravenous fluids.  Will recheck her  labs  tomorrow.  Ask for help from pharmacy on anticoagulation.  Her study was  indeterminate so it is not totally clear that she actually has had a  pulmonary embolus and depending on results of her venous study, may try to  do some further investigation, particularly if her renal function improves.      Edward L. Juanetta Gosling, M.D.  Electronically Signed     ELH/MEDQ  D:  03/14/2005  T:  03/14/2005  Job:  [id number]

## 2010-10-12 NOTE — Group Therapy Note (Signed)
NAME:  [name] [name], [name] [name]                ACCOUNT NO.:  [id number]   MEDICAL RECORD NO.:  [id number]          PATIENT TYPE:  INP   LOCATION:  A208                          FACILITY:  [facility]   PHYSICIAN:  Edward L. Juanetta Gosling, M.D.DATE OF BIRTH:  [birth year]   DATE OF PROCEDURE:  03/22/2005  DATE OF DISCHARGE:                                   PROGRESS NOTE   PROBLEM LIST:  1.  Pulmonary embolus.  2.  Gastrointestinal bleeding.  3.  Status post vena caval filter.  4.  Dementia.  5.  Shingles.  6.  Anemia secondary gastrointestinal bleeding.  7.  Chronic knee pain.   SUBJECTIVE:  Mr. Zale says she is feeling a little better.  She has no  new complaints.   OBJECTIVE:  GENERAL:  Her physical exam shows that her shingles look much  better.  She still mildly confused.  CHEST:  Her chest is clear.  VITAL SIGNS:  Her temperature is 98.8, pulse 70, respirations 20, blood  pressure 159/71, weight 183.   Her hemoglobin is 10.4 and stable, platelets 212.  Her Helicobacter titer  was negative.   ASSESSMENT:  She is better.   PLAN:  She is going to be hopefully discharged to the nursing home today.  Please see discharge summary for details.      Edward L. Juanetta Gosling, M.D.  Electronically Signed     ELH/MEDQ  D:  03/22/2005  T:  03/22/2005  Job:  [id number]

## 2010-10-12 NOTE — Procedures (Signed)
NAME:  KASMIRA, CACIOPPO                ACCOUNT NO.:  [id number]   MEDICAL RECORD NO.:  [id number]          PATIENT TYPE:  INP   LOCATION:  A208                          FACILITY:  [facility]   PHYSICIAN:  [place] Bing, M.D. [facility] OF BIRTH:  [birth year]   DATE OF PROCEDURE:  03/21/2005  DATE OF DISCHARGE:                                  ECHOCARDIOGRAM   REFERRING PHYSICIAN:  Ramon Dredge L. Juanetta Gosling, M.D.   CLINICAL DATA:  A 77 year old woman with conduction system disease,  bradycardia and COPD.   M-MODE TRACINGS:  Aorta 3.2, left atrium 4.1, septum 1.7, posterior wall  1.4, LV diastole 4.0, LV systole 2.6.   FINDINGS:  1.  Technically adequate echocardiographic study.  2.  Left atrial size at the upper limit of normal; normal right atrium.  3.  Slight right ventricular dilatation; normal systolic function.  4.  Sclerosis of a trileaflet aortic valve; mild annular calcification; no      stenosis by Doppler.  5.  Normal mitral valve; mild annular calcification; trivial regurgitation.  6.  Normal tricuspid valve; mild regurgitation; normal estimated RV systolic      pressure.  7.  Normal pulmonic valve and proximal pulmonary artery.  8.  Normal left ventricular size; mild to moderate hypertrophy with      disproportionate involvement of the upper septum; normal regional and      global function.  9.  Normal IVC.      [place] Bing, M.D. [facility]  Electronically Signed     RR/MEDQ  D:  03/21/2005  T:  03/22/2005  Job:  [id number]

## 2010-10-12 NOTE — Group Therapy Note (Signed)
NAME:  Yeoman, Punam                ACCOUNT NO.:  [id number]   MEDICAL RECORD NO.:  [id number]          PATIENT TYPE:  INP   LOCATION:  A208                          FACILITY:  [facility]   PHYSICIAN:  Edward L. Juanetta Gosling, M.D.DATE OF BIRTH:  [birth year]   DATE OF PROCEDURE:  03/18/2005  DATE OF DISCHARGE:                                   PROGRESS NOTE   Ms. Benito continues confused. She has no new complaints; except she wants  to go home. I have explained to her, again, that she is going to be going to  a nursing home; and she does not want to do that but understands. She is  still confused as mentioned.   OBJECTIVE:  Her physical examination shows that her temperature is 97.1,  pulse 73, respirations 20, blood pressure 144/81, O2 saturation is 95% on  room air.   Her lab work shows hemoglobin 9.3, white count 7900.  BMET shows that the  BUN is down to 27, creatinine 1.3. I am going to see if we can get a CT  chest done to try to get a better idea if she indeed did have pulmonary  emboli. If she did, she is going to require a filter placement. I am also  going to go ahead and ask for a GI consultation today. She does not have any  complaints of abdominal pain. We will plan to continue other treatments and  follow.      Edward L. Juanetta Gosling, M.D.  Electronically Signed     ELH/MEDQ  D:  03/18/2005  T:  03/18/2005  Job:  [id number]

## 2010-10-12 NOTE — Discharge Summary (Signed)
NAME:  [name] [name], [name] [name]                ACCOUNT NO.:  [id number]   MEDICAL RECORD NO.:  [id number]          PATIENT TYPE:  INP   LOCATION:  A313                          FACILITY:  [facility]   PHYSICIAN:  Tesfaye D. Felecia Shelling, MD   DATE OF BIRTH:  [birth year]   DATE OF ADMISSION:  09/24/2006  DATE OF DISCHARGE:  05/05/2008LH                               DISCHARGE SUMMARY   DISCHARGE DIAGNOSES:  1. Acute exacerbation of chronic obstructive pulmonary disease.  2. Probably some posterior pneumonia.  3. Hypertension.  4. History of pulmonary embolism.  5. Dementia.  6. Depression disorder.  7. Anemia.  8. History of gastrointestinal bleed.  9. History of second-degree atrioventricular block.  10.Chronic her right knee pain.   DISCHARGE MEDICATIONS:  1. Prednisone 40 mg p.o. daily x3 days, then 30 mg x3 days, then 20 mg      x3 days, then 10 mg x3 days and then discontinue.  2. Atrovent and Proventil nebulizer q.4 h.  3. Protonix 40 mg p.o. daily.  4. Aricept 10 mg p.o. daily.  5. Lexapro 10 mg p.o. daily.  6. Ditropan 10 mg p.o. daily.  7. Senna two tablets p.o. daily.  8. Namenda 10 mg b.i.d.  9. Accu-Chek with sliding scale coverage while the patient is on      steroids.  10.Lisinopril 20 mg p.o. daily.  11.Endocet 25 mg p.o. b.i.d. p.r.n.   DISPOSITION:  The patient will be transferred back to [facility].   HOSPITAL COURSE:  This is 77 year old female patient with history of  multiple medical illnesses who was admitted due to shortness of breath  and a recurrent cough.  The patient was found to have acute exacerbation  of chronic obstructive pulmonary disease.  Her chest x-ray done in the  nursing home showed also infiltrate with a  possibility of pneumonia.  The patient was treated with IV antibiotics  and nebulizer treatments.  Over the hospital stay, the patient improved  and she is back to her baseline.  The patient will be discharged back to  nursing home in stable  condition.      Tesfaye D. Felecia Shelling, MD  Electronically Signed     TDF/MEDQ  D:  09/29/2006  T:  09/29/2006  Job:  [id number]

## 2010-10-12 NOTE — Discharge Summary (Signed)
NAME:  [name] [name]                ACCOUNT NO.:  [id number]   MEDICAL RECORD NO.:  [id number]          PATIENT TYPE:  INP   LOCATION:  A208                          FACILITY:  [facility]   PHYSICIAN:  Edward L. Juanetta Gosling, M.D.DATE OF BIRTH:  02-May-1929   DATE OF ADMISSION:  03/13/2005  DATE OF DISCHARGE:  10/27/2006LH                                 DISCHARGE SUMMARY   DISCHARGE DIAGNOSES:  1.  Acute pulmonary embolism.  2.  Chest pain secondary to #1.  3.  Gastrointestinal bleeding from peptic ulcer disease.  4.  Chronic obstructive pulmonary disease  5.  Dementia.  6.  Second degree AV block on monitor.  7.  Shingles.  8.  Depression.  9.  Chronic right knee pain.  10. Anemia secondary to gastrointestinal bleeding.  11. Mild acute renal dysfunction.   HISTORY OF PRESENT ILLNESS:  [name] [name] is a 77 year old who came to the  emergency room because of chest pain.  She has significant dementia in her  hip, and it is difficult to be certain about, but she was complaining of  chest pain and had an elevated D. dimer which then led to her having a  ventilation perfusion lung scan.  She did not have a CT initially, because  her renal function was not normal.  The ventilation perfusion scan was  indeterminate, and because we could not be certain about a diagnosis with  concern about a non-treated possible pulmonary embolus, she was started on  anticoagulation.   PHYSICAL EXAMINATION:  GENERAL APPEARANCE:  Confused, awake and alert.  VITAL SIGNS:  Temperature 96.8, pulse 73, respirations 20, blood pressure  139/66.  HEENT:  Pupils are reactive.  Mucous membranes are minimally dry.  CHEST:  Clear.  HEART:  Regular without murmurs, gallops, rubs.  ABDOMEN:  Soft without masses.  EXTREMITIES:  No edema.  She had complaints of pain in her right knee, and  she had been placed on Indomethacin by Dr. Romeo Apple who is her orthopedist  for right knee pain.   LABORATORY DATA:  Potassium 5.2, BUN  72, creatinine 3.2.  CBC:  White count  9400, hemoglobin 12.9, platelets 236,000.  D. dimer was 5.6.   HOSPITAL COURSE:  She was started on anticoagulation.  It was not clear  initially whether she had had a pulmonary embolus or had some other problem.  So she underwent a venous study which was negative for clot.  It was thought  that her renal dysfunction might have been due to the Indomethacin, so that  was held.  She was being given IV fluids in anticipation of seeing what her  renal function did, and it was improving when she developed GI bleeding with  melanotic stool.  At that point, her Lovenox was discontinued.  She was  transfused, had GI consultation, and her renal function did improve at about  that time to the point that she could have a CT of the chest.  CT did show  pulmonary embolus, so she underwent a venous filter or umbrella placement at  [facility].  She then underwent  EGD which showed marked  ulcerations, probably related to her Indomethacin.  Lovenox was continued to  be held.  She was noted to have episodes of what looked like a Wenckebach  type VA block, but these were asymptomatic, and cardiology consultation felt  that no further treatment was necessary for that unless she developed high  grade block.  She had a Helicobacter titer that was negative.  At about the  time she underwent her EGD, she was also found to have left sided shingles.  She was treated for that with Zovirax.  By the time of discharge, her  hemoglobin level had been stable for about 36 hours.  Her shingles did not  seem to be a significant problem.  She had the vena cava filter in place,  and she is transferred to the skilled care facility on Zovirax 800 mg five  times a day times five more days, Lexapro 10 mg daily, Namenda 10 mg b.i.d.,  Protonix 40 mg b.i.d. times one month then daily, Detrol LA 4 mg daily,  Xanax 0.25 mg p.o. q.6 h p.r.n., Senokot two at bedtime p.r.n., Tylenol 1 g   p.o. q.4 h p.r.n., Aricept 10 mg daily.  She is going to have Vicodin 5/500  one q.i.d. p.r.n. pain.  She will need another CBC on October 30.      Edward L. Juanetta Gosling, M.D.  Electronically Signed     ELH/MEDQ  D:  03/22/2005  T:  03/22/2005  Job:  [id number]

## 2010-10-12 NOTE — Group Therapy Note (Signed)
NAME:  [name] [name], [name] [name]                ACCOUNT NO.:  [id number]   MEDICAL RECORD NO.:  [id number]          PATIENT TYPE:  INP   LOCATION:  A208                          FACILITY:  [facility]   PHYSICIAN:  Edward L. Juanetta Gosling, M.D.DATE OF BIRTH:  May 23, 1929   DATE OF PROCEDURE:  03/15/2005  DATE OF DISCHARGE:                                   PROGRESS NOTE   SUBJECTIVE:  Ms. Lao says she is feeling a little better. She has no  new complaints.   OBJECTIVE:  VITAL SIGNS:  Her physical examination this morning shows  temperature is 97.2, pulse 64, respirations 20, blood pressure 146/69.  GENERAL:  She is still complaining of significant knee pain. Her physical  examination as above.  CHEST:  Her chest is pretty clear.  HEART:  Heart is regular.  ABDOMEN:  Her abdomen is soft.  EXTREMITIES:  Extremities showed no edema.   Her white count 7,500, hemoglobin 11.9, platelets 234.   ASSESSMENT:  She is better, I think.   PLAN:  My plan is to continue with IV fluids. I am hopeful to get her BUN  and creatinine low enough that she can undergo a CT of the chest as I am not  convinced that what she has had is a pulmonary embolus. At this point,  though, I think we have to treat her for pulmonary embolism until we prove  otherwise, unless she develops some complication.      Edward L. Juanetta Gosling, M.D.  Electronically Signed     ELH/MEDQ  D:  03/16/2005  T:  03/17/2005  Job:  [id number]

## 2010-10-12 NOTE — H&P (Signed)
NAME:  [name] [name], [name] [name]                ACCOUNT NO.:  [id number]   MEDICAL RECORD NO.:  [id number]          PATIENT TYPE:  INP   LOCATION:  A313                          FACILITY:  [facility]   PHYSICIAN:  Tesfaye D. Felecia Shelling, MD   DATE OF BIRTH:  Mar 06, 1929   DATE OF ADMISSION:  09/24/2006  DATE OF DISCHARGE:  LH                              HISTORY & PHYSICAL   CHIEF COMPLAINT:  Shortness of breath.   HISTORY OF PRESENT ILLNESS:  This is a 77 year old female patient  resident of [facility] who was admitted due to the above  complaint.  The patient had symptoms of cough and shortness of breath  for the last one week.  She had a chest x-ray that was done in the  nursing home which showed signs of chronic obstructive pulmonary disease  and possible infiltrate.  She received oral steroids, antibiotics, and  nebulizer treatments.  However, her symptoms continued to get worse.  The patient had a recurrent deep cough with yellowish sputum.  She was  short of breath and wheezing all the time.  The patient was reevaluated,  and she was admitted to the floor.   REVIEW OF SYSTEMS:  The patient had no fever or chills.  No headache.  No nausea, vomiting, abdominal pain, dysuria, urgency, or frequency of  urination.   PAST MEDICAL HISTORY:  1. Chronic obstructive pulmonary disease.  2. History of acute pulmonary embolism.  3. Chest pains.  4. History of GI bleed.  5. Dementia.  6. Second-degree AV block.  7. Depression disorder.  8. Shingles.  9. Chronic right knee pain.  10.Anemia.  11.Renal insufficiency.   CURRENT MEDICATIONS:  1. Omeprazole 20 mg p.o. daily.  2. Aricept 10 mg p.o. daily.  3. Lexapro 20 mg daily.  4. Oxybutynin 10 mg daily.  5. Lisinopril 5 mg daily.  6. Namenda 10 mg b.i.d.  7. Atrovent and Proventil nebulizers q.4 h.  8. Senna 1-2 tablets p.r.n. for constipation.  9. Lortab 5/500, 1 tablet p.o. b.i.d.  10.Indocin 25 mg p.o. b.i.d.  11.Promethazine 12.5 mg p.o.  q.6 h. p.r.n.  12.Robitussin cough syrup 10 mL p.o. q.6 h. p.r.n.  13.Levaquin 750 mg p.o. daily.  14.Prednisolone Dosepak.   SOCIAL HISTORY:  The patient is widowed.  She is a resident of [facility].  No history of alcohol, tobacco, or substance abuse  recently.   PHYSICAL EXAMINATION:  VITAL SIGNS:  Blood pressure 151/86, pulse 83,  respiratory rate 18, temperature 98 degrees Fahrenheit.  HEENT:  Pupils are equal and reactive.  NECK:  Supple.  CHEST:  Poor air entry.  Bilateral rhonchi.  There are also diffuse  expiratory wheezes.  CARDIOVASCULAR:  First and second heart sounds heard.  No murmur, no  gallop.  ABDOMEN:  Soft and lax.  Bowel sounds positive.  No masses or  organomegaly.  EXTREMITIES:  No leg edema.   ASSESSMENT:  1. Acute exacerbation of chronic obstructive pulmonary disease.  2. To rule out superimposed pneumonia.  3. History of pulmonary embolism.  4. History of GI bleed.  5. Dementia.  6. Depression disorder.  7. Anemia.  8. Right knee pain.   PLAN:  Will start the patient on IV Solu-Medrol.  Will also start the  patient on Zosyn 3.375 q.8 h.  Will continue DuoNebs.  Will do baseline  studies including CBC, BMP, ABG, chest x-ray, and EKG.  Will continue  the patient on her regular medications.      Tesfaye D. Felecia Shelling, MD  Electronically Signed     TDF/MEDQ  D:  09/25/2006  T:  09/25/2006  Job:  [id number]

## 2010-10-12 NOTE — Op Note (Signed)
NAME:  [name], [name]                ACCOUNT NO.:  [id number]   MEDICAL RECORD NO.:  [id number]          PATIENT TYPE:  INP   LOCATION:  A208                          FACILITY:  [facility]   PHYSICIAN:  Lionel December, M.D.    DATE OF BIRTH:  1928/11/09   DATE OF PROCEDURE:  03/19/2005  DATE OF DISCHARGE:                                 OPERATIVE REPORT   PROCEDURE:  Esophagogastroduodenoscopy.   INDICATIONS:  [name] [name] is a 77 year old Caucasian female with history of  melena. She has been on NSAID therapy for gouty and possibly osteoarthritis.  She also developed DVT and had IVC filter placed yesterday, since she is not  a candidate for anticoagulation. Informed consent for the procedure was  obtained from her son.   PREMEDICATION:  Cetacaine spray pharyngeal topical anesthesia, Versed 2 mg  IV.   FINDINGS:  Procedure performed in endoscopy suite. The patient's vital signs  and O2 saturation were monitored during the procedure and remained stable.  The patient was placed in left lateral position and Olympus videoscope was  passed via oropharynx without any difficulty into esophagus.   Esophagus. Mucosa of the esophagus was normal. GE junction was at 38 cm from  the incisors.   Stomach. It was empty and distended very well insufflation. Folds of  proximal stomach were normal. Examination of mucosa revealed multiple  erosions at antrum but no ulcer crater was noted. Angularis, fundus and were  cardia examined by retroflexing the scope and were normal. Examination of  pyloric channel revealed 6 mm ulcer on the left side without luminal  compromise. No stigmata of bleeding were noted.   Duodenum. Mucosa of the proximal bowel was normal, but distally there was  erythema and granularity and 5-6 mm triangular ulcer once again with clean  base. Scope was passed to the second part of duodenum where mucosa and folds  were normal. Endoscope was withdrawn. The patient tolerated the  procedure  well.   FINAL DIAGNOSIS:  1.  Erosive antral gastritis, need to rule out H pylori infection.  2.  Pyloric channel ulcer without stigmata of bleed.  3.  Bulbar ulcer with duodenitis without stigmata of bleed.   Either of these ulcers could be source of the patient's recent GI blood  loss.   RECOMMENDATIONS:  1.  PPI will be changed to b.i.d. by mouth.  2.  H pylori serology.  3.  She will have H&H in a.m.  4.  As discussed with Dr. Juanetta Gosling, she will also be begun on Zovirax 800 mg      p.o. 5 times a day for 1 week for herpes zoster involving left      dermatome, T10. Please note that she did not have rash yesterday when      she was seen by Ms. Bill Salinas, P.A.-C., and Dr. Jena Gauss, and she has      very faint macular and vesicular rash now.  5.  No NSAID therapy for at least two weeks. Thereafter, if she needs to be      on NSAID therapy, it has to  be low-dose in combination with PPI. This      combination will reduce the risk of bleed significantly but will not      bring it down to zero.      Lionel December, M.D.  Electronically Signed     NR/MEDQ  D:  03/19/2005  T:  03/19/2005  Job:  [id number]

## 2010-10-12 NOTE — Consult Note (Signed)
NAME:  [name] [name], [name] [name]                ACCOUNT NO.:  [id number]   MEDICAL RECORD NO.:  [id number]          PATIENT TYPE:  INP   LOCATION:  A208                          FACILITY:  [facility]   PHYSICIAN:  Vida Roller, M.D.   DATE OF BIRTH:  Oct 23, 1928   DATE OF CONSULTATION:  03/20/2005  DATE OF DISCHARGE:                                   CONSULTATION   PRIMARY CARE PHYSICIAN:  Oneal Deputy. Juanetta Gosling, M.D.   HISTORY OF PRESENT ILLNESS:  Mrs. Landstrom is a 77 year old woman with  severe dementia who is admitted to the hospital for a pulmonary embolus. She  ended up getting Coumadin started and had an upper GI bleed and was found to  have a duodenal and pyloric channel ulcer, and because of pulmonary embolus,  the decision was made to put an IVC filter in. She had that placed two days  ago. Her electrocardiographic rhythm has deteriorated into complex AV block  and we were asked to evaluate her. She is asymptomatic, although quite  significantly demented.   PAST MEDICAL HISTORY:  1.  Significant for severe dementia.  2.  She has a history of severe depression.  3.  She has COPD.  4.  History of a CVA.  5.  She has had an appendectomy in the past.   MEDICATIONS:  1.  Namenda 10 mg twice daily.  2.  Aricept 10 mg daily.  3.  Lexapro 10 mg a day.  4.  Indomethacin 75 mg a day.  5.  Colchicine 0.6 mg q.6h.  6.  Detrol LA 4 mg.   HOSPITAL MEDICATIONS:  1.  Here in the hospital she is on Zovirax 800 mg five times a day.  2.  Aricept 10 mg at night.  3.  Lovenox which has been held due to the bleeding.  4.  Lexapro 10 mg a day.  5.  Namenda 10 mg twice a day.  6.  Protonix 40 mg twice a day.  7.  IV normal saline at 125 cc a hour.  8.  Detrol 4 mg a day.  9.  She is on Warfarin pharmacy protocol, although this has been held as      well.   She lives in [place] with her son. She is a widow. She has three  children. She used to smoke but she quit recently. It is uncertain what  her  total pack years are. She does not drink alcohol or use illicit drugs. I was  not able to obtain a family history or a review of systems due to her  dementia.   PHYSICAL EXAMINATION:  GENERAL:  She is a well-developed, slightly demented  white female who is in no apparent distress and is alert and oriented x1.  She does know who she. She thought she was at [facility] and I told  her she was at [facility]. She did not have any idea what day it  was. She is, however, asymptomatic. Denies any chest discomfort or shortness  of breath. No dizziness. Lying flat in bed without any  difficult.  VITAL SIGNS:  Her pulse is 66 and irregular. Her respiratory rate is 20. Her  blood pressure is 139/73. She is saturating 98% on room air. She is  receiving blood transfusion currently.  HEENT:  Unremarkable.  NECK:  Supple. She has no jugular venous distention or carotid bruits.  LUNGS:  Clear to auscultation.  CARDIOVASCULAR:  Irregular. She has a 2/6 holosystolic murmur.  SKIN:  Without significant lesions.  ABDOMEN:  Soft and nontender. Normal active bowel sounds.  GENITOURINARY:  Deferred.  RECTAL:  Deferred.  BREASTS:  Deferred.  EXTREMITIES:  She has trace edema in her left lower extremity. There are 2+  pulses throughout.  MUSCULOSKELETAL:  Unremarkable.  NEUROLOGICAL:  Unremarkable aside from the dementia.   LABORATORY DATA:  She had a chest CT which showed a pulmonary embolus, mild  cardiomegaly. She had lower extremity Doppler's which were negative for DVT.  She had a chest x-ray which showed no acute findings. Her initial  electrocardiogram showed sinus rhythm with a first degree AV block. She  subsequently had several EKG's which showed a Wenckebach pattern. There is  one rhythm strip which shows some 2:1 heart block. Her baseline  electrocardiogram shows a normal axis. She has a prolonged PR interval. Her  QRS duration is 72 msec. Her QT is normal. Her most recent  set of  laboratories show a white blood cell count of 7.0, H&H of 7 and 23 with a  platelet count of 212,000. Sodium 141, potassium 3.5, chloride 114,  bicarbonate 21, BUN 13, creatinine 1.1, and her blood sugar is 80. Her INR  is 1.1. Her fecal blood was positive. Her d-dimer was 5.68 and her B-type  natriuretic peptide was 74.   ASSESSMENT:  So this is a lady with some arteriovenous block which appears  to be asymptomatic. She is hemodynamically stable. She is being  resuscitated. I did not see any significant pause in her entire telemetry  strips I was able to evaluate over the course of the last 48 hours and she  does not have any significant symptoms of shortness of breath. One wonders  if potentially the Aricept or the Detrol is exacerbating her arteriovenous  block as both medications are certainly related to arteriovenous block,  although relatively circumstantially. She is obviously on no arteriovenous  nodal blocking agents. I probably would stop the Aricept as it has not been  shown to be particularly effective in Alzheimer's dementia anyway. We will  follow her with you. There is a chance she may require a pacemaker. I do not  think she needs it acutely now. I certainly would not put a temporary wire  in her now.  I talked with Dr. Doylene Canning. Ladona Ridgel briefly and we are happy to put a  pacemaker in her if she has any progression, but at this point, I think I  would hold off on addressing this issue as the [employer] of  Cardiology guidelines make this is a IIB indication for permanent pacing.      Vida Roller, M.D.  Electronically Signed     JH/MEDQ  D:  03/20/2005  T:  03/21/2005  Job:  [id number]   cc:   Ramon Dredge L. Juanetta Gosling, M.D.  Fax: [phone]

## 2010-10-12 NOTE — Group Therapy Note (Signed)
NAME:  [name] [name], [name] [name]                ACCOUNT NO.:  [id number]   MEDICAL RECORD NO.:  [id number]          PATIENT TYPE:  INP   LOCATION:  A208                          FACILITY:  [facility]   PHYSICIAN:  Edward L. Juanetta Gosling, M.D.DATE OF BIRTH:  09/20/1928   DATE OF PROCEDURE:  03/21/2005  DATE OF DISCHARGE:                                   PROGRESS NOTE   PROBLEM LIST:  1.  Heart block.  2.  Gastrointestinal bleeding.  3.  Pulmonary embolus.  4.  Shingles.  5.  Knee pain.  6.  Dementia.   SUBJECTIVE:  [name] [name] says she is feeling a little better.  She has no  new complaints.   OBJECTIVE:  VITAL SIGNS:  Temperature is 98.5, pulse 70, respirations 20,  blood pressure 162/81.  CHEST:  Her chest is fairly clear.  SKIN:  Her rash is much better.   Hemoglobin level this morning 10.4, seems to be stable, platelets 212.   ASSESSMENT:  She seems to be doing better.   PLAN:  I am hopeful that she may be able to be transferred to the skilled  care facility tomorrow, but that will depend on whether they can take her  with shingles or not.      Edward L. Juanetta Gosling, M.D.  Electronically Signed     ELH/MEDQ  D:  03/21/2005  T:  03/22/2005  Job:  [id number]

## 2011-02-11 ENCOUNTER — Ambulatory Visit (HOSPITAL_COMMUNITY)
Admission: RE | Admit: 2011-02-11 | Discharge: 2011-02-11 | Disposition: A | Discharge status: Home / Self Care | Payer: Medicare Other | Source: Ambulatory Visit | Attending: Urology | Admitting: Urology

## 2011-02-11 ENCOUNTER — Other Ambulatory Visit (HOSPITAL_COMMUNITY): Payer: Self-pay | Admitting: Urology

## 2011-02-11 DIAGNOSIS — N201 Calculus of ureter: Secondary | ICD-10-CM

## 2011-02-11 DIAGNOSIS — R109 Unspecified abdominal pain: Secondary | ICD-10-CM | POA: Insufficient documentation

## 2011-02-11 DIAGNOSIS — N200 Calculus of kidney: Secondary | ICD-10-CM

## 2011-02-11 DIAGNOSIS — Z9889 Other specified postprocedural states: Secondary | ICD-10-CM | POA: Insufficient documentation

## 2011-02-14 ENCOUNTER — Ambulatory Visit (HOSPITAL_COMMUNITY)
Admission: RE | Admit: 2011-02-14 | Discharge: 2011-02-14 | Disposition: A | Discharge status: Home / Self Care | Payer: PRIVATE HEALTH INSURANCE | Source: Ambulatory Visit | Attending: Urology | Admitting: Urology

## 2011-02-14 DIAGNOSIS — Z9889 Other specified postprocedural states: Secondary | ICD-10-CM | POA: Insufficient documentation

## 2011-02-14 DIAGNOSIS — N200 Calculus of kidney: Secondary | ICD-10-CM | POA: Insufficient documentation

## 2011-02-14 DIAGNOSIS — N390 Urinary tract infection, site not specified: Secondary | ICD-10-CM | POA: Insufficient documentation

## 2011-02-14 DIAGNOSIS — N1330 Unspecified hydronephrosis: Secondary | ICD-10-CM | POA: Insufficient documentation

## 2011-03-05 ENCOUNTER — Ambulatory Visit (HOSPITAL_COMMUNITY)
Admission: RE | Admit: 2011-03-05 | Discharge: 2011-03-05 | Disposition: A | Discharge status: Home / Self Care | Payer: PRIVATE HEALTH INSURANCE | Source: Ambulatory Visit | Attending: Urology | Admitting: Urology

## 2011-03-05 ENCOUNTER — Other Ambulatory Visit (HOSPITAL_COMMUNITY): Payer: Self-pay | Admitting: Urology

## 2011-03-05 DIAGNOSIS — N200 Calculus of kidney: Secondary | ICD-10-CM

## 2011-03-05 DIAGNOSIS — R1031 Right lower quadrant pain: Secondary | ICD-10-CM | POA: Insufficient documentation

## 2011-03-05 DIAGNOSIS — N210 Calculus in bladder: Secondary | ICD-10-CM

## 2011-03-08 LAB — CBC
HCT: 33.5 — ABNORMAL LOW
Hemoglobin: 12.9
Hemoglobin: 11.2 — ABNORMAL LOW
MCHC: 33.3
MCHC: 33.8
MCV: 82
MCV: 82.2
Platelets: 224
RBC: 4.73
RBC: 3.99
RDW: 14.8 — ABNORMAL HIGH

## 2011-03-08 LAB — COMPREHENSIVE METABOLIC PANEL
ALT: 10
AST: 15
Albumin: 3 — ABNORMAL LOW
CO2: 28
Calcium: 9.2
Chloride: 102
GFR calc Af Amer: 41 — ABNORMAL LOW
GFR calc non Af Amer: 34 — ABNORMAL LOW
Sodium: 138
Total Bilirubin: 0.6

## 2011-03-08 LAB — BASIC METABOLIC PANEL
BUN: 30 — ABNORMAL HIGH
BUN: 33 — ABNORMAL HIGH
BUN: 37 — ABNORMAL HIGH
CO2: 27
CO2: 26
Calcium: 8.2 — ABNORMAL LOW
Calcium: 8.9
Calcium: 9.2
Chloride: 99
Chloride: 102
Chloride: 106
Creatinine, Ser: 2.08 — ABNORMAL HIGH
Creatinine, Ser: 2.93 — ABNORMAL HIGH
Creatinine, Ser: 3.33 — ABNORMAL HIGH
Creatinine, Ser: 3.37 — ABNORMAL HIGH
GFR calc Af Amer: 46 — ABNORMAL LOW
GFR calc Af Amer: 16 — ABNORMAL LOW
GFR calc Af Amer: 19 — ABNORMAL LOW
GFR calc Af Amer: 28 — ABNORMAL LOW
GFR calc non Af Amer: 13 — ABNORMAL LOW
GFR calc non Af Amer: 13 — ABNORMAL LOW
GFR calc non Af Amer: 16 — ABNORMAL LOW
GFR calc non Af Amer: 17 — ABNORMAL LOW
Glucose, Bld: 127 — ABNORMAL HIGH
Glucose, Bld: 138 — ABNORMAL HIGH
Potassium: 4.4
Potassium: 4.5
Potassium: 4.6
Sodium: 137
Sodium: 134 — ABNORMAL LOW
Sodium: 136

## 2011-03-08 LAB — HEPATIC FUNCTION PANEL
ALT: 15
Bilirubin, Direct: 0.1
Indirect Bilirubin: 0.4
Total Bilirubin: 0.5

## 2011-03-08 LAB — DIFFERENTIAL
Basophils Absolute: 0
Basophils Relative: 0
Eosinophils Absolute: 0.4
Eosinophils Relative: 4
Lymphocytes Relative: 22
Lymphs Abs: 1.7
Monocytes Absolute: 0.5
Monocytes Absolute: 0.7
Monocytes Relative: 9
Neutro Abs: 4.5
Neutrophils Relative %: 62

## 2011-03-08 LAB — TSH: TSH: 2.172

## 2011-03-11 ENCOUNTER — Encounter (HOSPITAL_COMMUNITY)
Admission: RE | Admit: 2011-03-11 | Discharge: 2011-03-11 | Disposition: A | Discharge status: Home / Self Care | Payer: PRIVATE HEALTH INSURANCE | Source: Ambulatory Visit | Attending: Urology | Admitting: Urology

## 2011-03-11 ENCOUNTER — Other Ambulatory Visit: Payer: Self-pay

## 2011-03-11 ENCOUNTER — Encounter (HOSPITAL_COMMUNITY): Payer: Self-pay

## 2011-03-11 HISTORY — DX: Chronic obstructive pulmonary disease, unspecified: J44.9

## 2011-03-11 HISTORY — DX: Gastro-esophageal reflux disease without esophagitis: K21.9

## 2011-03-11 HISTORY — DX: Shortness of breath: R06.02

## 2011-03-11 HISTORY — DX: Chronic kidney disease, unspecified: N18.9

## 2011-03-11 HISTORY — DX: Depression, unspecified: F32.A

## 2011-03-11 HISTORY — DX: Major depressive disorder, single episode, unspecified: F32.9

## 2011-03-11 HISTORY — DX: Essential (primary) hypertension: I10

## 2011-03-11 LAB — BASIC METABOLIC PANEL
CO2: 20
Calcium: 10.3 mg/dL (ref 8.4–10.5)
Chloride: 99
Chloride: 98 mEq/L (ref 96–112)
Creatinine, Ser: 1.95 mg/dL — ABNORMAL HIGH (ref 0.50–1.10)
GFR calc Af Amer: 17 — ABNORMAL LOW
GFR calc Af Amer: 27 mL/min — ABNORMAL LOW (ref >90)
Glucose, Bld: 296 — ABNORMAL HIGH
Sodium: 131 — ABNORMAL LOW
Sodium: 136 mEq/L (ref 135–145)

## 2011-03-11 LAB — CBC
HCT: 37.3
Hemoglobin: 12.5
MCHC: 33.4
MCV: 83.4
RBC: 4.48
RDW: 14

## 2011-03-11 LAB — URINE MICROSCOPIC-ADD ON

## 2011-03-11 LAB — DIFFERENTIAL
Basophils Absolute: 0
Basophils Relative: 0
Eosinophils Absolute: 0
Eosinophils Relative: 0
Monocytes Absolute: 0.2
Monocytes Relative: 1 — ABNORMAL LOW

## 2011-03-11 LAB — URINE CULTURE
Colony Count: NO GROWTH
Culture: NO GROWTH

## 2011-03-11 LAB — SODIUM, URINE, RANDOM: Sodium, Ur: 131

## 2011-03-11 LAB — URINALYSIS, ROUTINE W REFLEX MICROSCOPIC
Hgb urine dipstick: NEGATIVE
Ketones, ur: NEGATIVE
Specific Gravity, Urine: >1.03 — ABNORMAL HIGH
Urobilinogen, UA: 1

## 2011-03-11 LAB — CULTURE, BLOOD (ROUTINE X 2)
Culture: NO GROWTH
Culture: NO GROWTH
Report Status: 8192008

## 2011-03-11 LAB — CREATININE, URINE, RANDOM: Creatinine, Urine: 143.49

## 2011-03-11 NOTE — Patient Instructions (Signed)
20 Florentine L Forney  03/11/2011   Your procedure is scheduled on:  03/13/11  Report to [facility] at 1100 AM.  Call this number if you have problems the morning of surgery: [phone]   Remember:   Do not eat food:After Midnight.  Do not drink clear liquids: After Midnight.  Take these medicines the morning of surgery with A SIP OF WATER: amlodipine, lexapro, omeprazole   Do not wear jewelry, make-up or nail polish.  Do not wear lotions, powders, or perfumes. You may wear deodorant.  Do not shave 48 hours prior to surgery.  Do not bring valuables to the hospital.  Contacts, dentures or bridgework may not be worn into surgery.  Leave suitcase in the car. After surgery it may be brought to your room.  For patients admitted to the hospital, checkout time is 11:00 AM the day of discharge.   Patients discharged the day of surgery will not be allowed to drive home.  Name and phone number of your driver: daughter  Special Instructions: N/A   Please read over the following fact sheets that you were given: Pain Booklet and Care and Recovery After Surgery

## 2011-03-13 ENCOUNTER — Encounter (HOSPITAL_COMMUNITY)
Admission: RE | Disposition: A | Discharge status: Home / Self Care | Payer: Self-pay | Source: Ambulatory Visit | Attending: Urology

## 2011-03-13 ENCOUNTER — Ambulatory Visit (HOSPITAL_COMMUNITY): Payer: PRIVATE HEALTH INSURANCE

## 2011-03-13 ENCOUNTER — Ambulatory Visit (HOSPITAL_COMMUNITY)
Admission: RE | Admit: 2011-03-13 | Discharge: 2011-03-13 | Disposition: A | Discharge status: Home / Self Care | Payer: PRIVATE HEALTH INSURANCE | Source: Ambulatory Visit | Attending: Urology | Admitting: Urology

## 2011-03-13 ENCOUNTER — Encounter (HOSPITAL_COMMUNITY): Payer: Self-pay | Admitting: *Deleted

## 2011-03-13 DIAGNOSIS — N390 Urinary tract infection, site not specified: Secondary | ICD-10-CM | POA: Insufficient documentation

## 2011-03-13 DIAGNOSIS — E119 Type 2 diabetes mellitus without complications: Secondary | ICD-10-CM | POA: Insufficient documentation

## 2011-03-13 DIAGNOSIS — Z01812 Encounter for preprocedural laboratory examination: Secondary | ICD-10-CM | POA: Insufficient documentation

## 2011-03-13 DIAGNOSIS — N200 Calculus of kidney: Secondary | ICD-10-CM | POA: Insufficient documentation

## 2011-03-13 DIAGNOSIS — Z79899 Other long term (current) drug therapy: Secondary | ICD-10-CM | POA: Insufficient documentation

## 2011-03-13 DIAGNOSIS — Z01810 Encounter for preprocedural cardiovascular examination: Secondary | ICD-10-CM | POA: Insufficient documentation

## 2011-03-13 DIAGNOSIS — I10 Essential (primary) hypertension: Secondary | ICD-10-CM | POA: Insufficient documentation

## 2011-03-13 HISTORY — PX: EXTRACORPOREAL SHOCK WAVE LITHOTRIPSY: SHX1557

## 2011-03-13 LAB — GLUCOSE, CAPILLARY: Glucose-Capillary: 98 mg/dL (ref 70–99)

## 2011-03-13 SURGERY — LITHOTRIPSY, ESWL
Anesthesia: Moderate Sedation | Laterality: Right

## 2011-03-13 MED ORDER — DIPHENHYDRAMINE HCL 25 MG PO CAPS
25.0000 mg | ORAL_CAPSULE | Freq: Once | ORAL | Status: AC
  Administered 2011-03-13: 25 mg via ORAL

## 2011-03-13 MED ORDER — DIAZEPAM 5 MG PO TABS
ORAL_TABLET | ORAL | Status: AC
  Filled 2011-03-13: qty 2

## 2011-03-13 MED ORDER — DIPHENHYDRAMINE HCL 25 MG PO CAPS: 25.0000 mg | ORAL_CAPSULE | Freq: Once | ORAL | Status: DC

## 2011-03-13 MED ORDER — DIAZEPAM 5 MG PO TABS
10.0000 mg | ORAL_TABLET | Freq: Once | ORAL | Status: AC
  Administered 2011-03-13: 10 mg via ORAL

## 2011-03-13 MED ORDER — DIPHENHYDRAMINE HCL 25 MG PO CAPS
ORAL_CAPSULE | ORAL | Status: AC
  Filled 2011-03-13: qty 1

## 2011-03-13 MED ORDER — SODIUM CHLORIDE 0.45 % IV SOLN
INTRAVENOUS | Status: DC
  Administered 2011-03-13: 12:00:00 via INTRAVENOUS

## 2011-03-13 SURGICAL SUPPLY — 3 items
CLOTH BEACON ORANGE TIMEOUT ST (SAFETY) ×2 IMPLANT
GOWN BRE IMP SLV AUR XL STRL (GOWN DISPOSABLE) ×4 IMPLANT
TOWEL OR 17X26 4PK STRL BLUE (TOWEL DISPOSABLE) ×2 IMPLANT

## 2011-03-13 NOTE — Progress Notes (Signed)
Left flank site red-no drainage.

## 2011-03-13 NOTE — H&P (Signed)
NAME:  [name] [name], [name] [name]                ACCOUNT NO.:  [id number]  MEDICAL RECORD NO.:  [id number]  LOCATION:                                 FACILITY:  PHYSICIAN:  Ky Barban, M.D.DATE OF BIRTH:  1928/07/30  DATE OF ADMISSION: DATE OF DISCHARGE:  LH                             HISTORY & PHYSICAL   CHIEF COMPLAINT:  Right staghorn calculus.  HISTORY:  A 77 year old female is having recurrent urinary tract infection.  I have not seen this patient for several years.  I had seen her several years ago with the renal failure and right ureteral calculus, which I removed.  I left a double-J stent on that side.  The patient never came back for followup.  The stent is still there, and she is having recurrent urinary tract infections and that is the reason she came to see me.  She is a resident of a local nursing home.  When I had seen her last, it was in August 2008.  At that time, she was having pain in the right flank and she also had a right 8 x 10 mm stone in the right upper ureter and also a large stone in the right kidney.  I removed the ureteral stent and left a double-J stent and as I mentioned above, she never came back for followup.  Now she is brought by her daughter because of recurrent urinary tract infections.  She is sitting in a wheelchair.  Even in 2008, she had dementia, also history of pneumonia and history of pulmonary embolism, depression, anemia, and GE reflux.  For the rest of the past medical history, please refer to her old record.  PHYSICAL EXAMINATION:  GENERAL:  Moderately built female, not in acute distress. VITAL SIGNS:  Blood pressure is 180/70.  Temperature is normal. CENTRAL NERVOUS SYSTEM:  No gross neurological deficits. NECK:  Supple. CHEST:  Symmetrical. HEART:  Regular sinus rhythm.  No murmur. ABDOMEN:  Soft, flat.  Liver, spleen, kidneys not palpable.  No CVA tenderness. PELVIC:  Deferred. EXTREMITIES:  Normal.  IMPRESSION: 1. Right  renal calculus with right ureteral calculus. 2. Bladder calculi. 3. Insulin-dependent diabetes. 4. Hypertension. 5. Chronic obstructive pulmonary disease. 6. Urinary tract infection, recurrent.  She had a KUB done I can see the stent, which has made stones over her loop in the kidney and also in the bladder.  I told her that I will see if we can do renal ESL and if I can break the stone which is attached to the double-J stent, then I may be able to remove the stent, but there is no guarantee that it will work and stent needs to be removed and then it needs to be replaced maybe with another stent, may be nothing. I will decide that later, but when I go for the stent, I will remove the stone from the bladder with laser lithotripsy.  I have discussed these findings and the problem with the patient's daughter.  She understands and wants me to proceed.     Ky Barban, M.D.     MIJ/MEDQ  D:  03/12/2011  T:  03/13/2011  Job:  [id number]  cc:   Tesfaye D. Felecia Shelling, MD Fax: [phone]

## 2011-03-13 NOTE — Brief Op Note (Signed)
H&P reviewed the pt examined no change in H&P

## 2011-03-15 ENCOUNTER — Encounter (HOSPITAL_COMMUNITY): Payer: Self-pay | Admitting: Urology

## 2011-03-20 ENCOUNTER — Ambulatory Visit (HOSPITAL_COMMUNITY)
Admission: RE | Admit: 2011-03-20 | Discharge: 2011-03-20 | Disposition: A | Discharge status: Home / Self Care | Payer: PRIVATE HEALTH INSURANCE | Source: Ambulatory Visit | Attending: Urology | Admitting: Urology

## 2011-03-20 ENCOUNTER — Other Ambulatory Visit (HOSPITAL_COMMUNITY): Payer: Self-pay | Admitting: Urology

## 2011-03-20 DIAGNOSIS — N200 Calculus of kidney: Secondary | ICD-10-CM

## 2011-03-20 DIAGNOSIS — R109 Unspecified abdominal pain: Secondary | ICD-10-CM | POA: Insufficient documentation

## 2011-03-25 ENCOUNTER — Encounter (HOSPITAL_COMMUNITY)
Admission: RE | Admit: 2011-03-25 | Discharge: 2011-03-25 | Disposition: A | Discharge status: Home / Self Care | Payer: PRIVATE HEALTH INSURANCE | Source: Ambulatory Visit | Attending: Urology | Admitting: Urology

## 2011-03-25 ENCOUNTER — Encounter (HOSPITAL_COMMUNITY): Payer: Self-pay

## 2011-03-25 LAB — BASIC METABOLIC PANEL
BUN: 31 mg/dL — ABNORMAL HIGH (ref 6–23)
GFR calc Af Amer: 27 mL/min — ABNORMAL LOW (ref >90)
GFR calc non Af Amer: 23 mL/min — ABNORMAL LOW (ref >90)
Potassium: 5 mEq/L (ref 3.5–5.1)
Sodium: 138 mEq/L (ref 135–145)

## 2011-03-25 LAB — CBC
MCHC: 31.8 g/dL (ref 30.0–36.0)
Platelets: 431 10*3/uL — ABNORMAL HIGH (ref 150–400)
RDW: 14.4 % (ref 11.5–15.5)

## 2011-03-25 LAB — SURGICAL PCR SCREEN: Staphylococcus aureus: POSITIVE — AB

## 2011-03-25 NOTE — Pre-Procedure Instructions (Signed)
Called [facility] and spoke with [employer] nurse.Went over preop instruction sheet with her. She verbalized understanding.

## 2011-03-25 NOTE — Patient Instructions (Addendum)
20 [name] [name]  03/25/2011   Your procedure is scheduled on:  03/28/2011  Report to [facility] at  1100 AM.  Call this number if you have problems the morning of surgery: [phone]   Remember:   Do not eat food:After Midnight.  Do not drink clear liquids: After Midnight.  Take these medicines the morning of surgery with A SIP OF WATER: norvasc,lexapro,prilosec,ditropan   Do not wear jewelry, make-up or nail polish.  Do not wear lotions, powders, or perfumes. You may wear deodorant.  Do not shave 48 hours prior to surgery.  Do not bring valuables to the hospital.  Contacts, dentures or bridgework may not be worn into surgery.  Leave suitcase in the car. After surgery it may be brought to your room.  For patients admitted to the hospital, checkout time is 11:00 AM the day of discharge.   Patients discharged the day of surgery will not be allowed to drive home.  Name and phone number of your driver: Daughter  Special Instructions: CHG Shower Use Special Wash: 1/2 bottle night before surgery and 1/2 bottle morning of surgery.   Please read over the following fact sheets that you were given: Pain Booklet, MRSA Information, Surgical Site Infection Prevention, Anesthesia Post-op Instructions and Care and Recovery After Surgery Cystoscopy (Bladder Exam) Care After Refer to this sheet in the next few weeks. These discharge instructions provide you with general information on caring for yourself after you leave the hospital. Your caregiver may also give you specific instructions. Your treatment has been planned according to the most current medical practices available, but unavoidable complications sometimes occur. If you have any problems or questions after discharge, please call your caregiver. AFTER THE PROCEDURE   There may be temporary bleeding and burning with urination.   Drink enough water and fluids to keep your urine clear or pale yellow.  FINDING OUT THE RESULTS OF YOUR TEST Not  all test results are available during your visit. If your test results are not back during the visit, make an appointment with your caregiver to find out the results. Do not assume everything is normal if you have not heard from your caregiver or the medical facility. It is important for you to follow up on all of your test results. SEEK IMMEDIATE MEDICAL CARE IF:   There is an increase in blood in the urine or you are passing clots.   There is difficulty passing urine.   You develop the chills.   You have an oral temperature above 102 F (38.9 C), not controlled by medicine.   Belly (abdominal) pain develops.  Document Released: 11/30/2004 Document Revised: 01/23/2011 Document Reviewed: 09/28/2007 [facility] Patient Information 2012 [place], [place].PATIENT INSTRUCTIONS POST-ANESTHESIA  IMMEDIATELY FOLLOWING SURGERY:  Do not drive or operate machinery for the first twenty four hours after surgery.  Do not make any important decisions for twenty four hours after surgery or while taking narcotic pain medications or sedatives.  If you develop intractable nausea and vomiting or a severe headache please notify your doctor immediately.  FOLLOW-UP:  Please make an appointment with your surgeon as instructed. You do not need to follow up with anesthesia unless specifically instructed to do so.  WOUND CARE INSTRUCTIONS (if applicable):  Keep a dry clean dressing on the anesthesia/puncture wound site if there is drainage.  Once the wound has quit draining you may leave it open to air.  Generally you should leave the bandage intact for twenty four hours unless there  is drainage.  If the epidural site drains for more than 36-48 hours please call the anesthesia department.  QUESTIONS?:  Please feel free to call your physician or the hospital operator if you have any questions, and they will be happy to assist you.     [facility] Anesthesia Department [address] [place]  [place] [phone]

## 2011-03-28 ENCOUNTER — Encounter (HOSPITAL_COMMUNITY): Payer: Self-pay | Admitting: Anesthesiology

## 2011-03-28 ENCOUNTER — Encounter (HOSPITAL_COMMUNITY)
Admission: RE | Disposition: A | Discharge status: Skilled Nursing Facility | Payer: Self-pay | Source: Ambulatory Visit | Attending: Urology

## 2011-03-28 ENCOUNTER — Ambulatory Visit (HOSPITAL_COMMUNITY): Payer: PRIVATE HEALTH INSURANCE

## 2011-03-28 ENCOUNTER — Other Ambulatory Visit: Payer: Self-pay | Admitting: Urology

## 2011-03-28 ENCOUNTER — Inpatient Hospital Stay (HOSPITAL_COMMUNITY)
Admission: RE | Admit: 2011-03-28 | Discharge: 2011-03-29 | DRG: 694 | Disposition: A | Discharge status: Skilled Nursing Facility | Payer: PRIVATE HEALTH INSURANCE | Source: Ambulatory Visit | Attending: Urology | Admitting: Urology

## 2011-03-28 ENCOUNTER — Encounter (HOSPITAL_COMMUNITY): Payer: Self-pay | Admitting: *Deleted

## 2011-03-28 DIAGNOSIS — N210 Calculus in bladder: Principal | ICD-10-CM | POA: Diagnosis present

## 2011-03-28 DIAGNOSIS — I10 Essential (primary) hypertension: Secondary | ICD-10-CM | POA: Diagnosis present

## 2011-03-28 DIAGNOSIS — N200 Calculus of kidney: Secondary | ICD-10-CM | POA: Diagnosis present

## 2011-03-28 DIAGNOSIS — F0390 Unspecified dementia without behavioral disturbance: Secondary | ICD-10-CM | POA: Diagnosis present

## 2011-03-28 DIAGNOSIS — J4489 Other specified chronic obstructive pulmonary disease: Secondary | ICD-10-CM | POA: Diagnosis present

## 2011-03-28 DIAGNOSIS — J449 Chronic obstructive pulmonary disease, unspecified: Secondary | ICD-10-CM | POA: Diagnosis present

## 2011-03-28 HISTORY — PX: CYSTOSCOPY W/ URETERAL STENT PLACEMENT: SHX1429

## 2011-03-28 HISTORY — PX: CYSTOSCOPY WITH LITHOLAPAXY: SHX1425

## 2011-03-28 LAB — GLUCOSE, CAPILLARY
Glucose-Capillary: 94 mg/dL (ref 70–99)
Glucose-Capillary: 110 mg/dL — ABNORMAL HIGH (ref 70–99)
Glucose-Capillary: 196 mg/dL — ABNORMAL HIGH (ref 70–99)

## 2011-03-28 SURGERY — CYSTOSCOPY, WITH BLADDER CALCULUS LITHOLAPAXY
Anesthesia: Spinal | Site: Ureter | Laterality: Right | Wound class: Clean Contaminated

## 2011-03-28 MED ORDER — FLUTICASONE PROPIONATE 50 MCG/ACT NA SUSP
2.0000 | Freq: Every day | NASAL | Status: DC
  Administered 2011-03-29: 2 via NASAL
  Filled 2011-03-28 (×2): qty 16

## 2011-03-28 MED ORDER — ONDANSETRON HCL 4 MG/2ML IJ SOLN: 4.0000 mg | Freq: Once | INTRAMUSCULAR | Status: DC | PRN

## 2011-03-28 MED ORDER — CALCIUM CARBONATE-VITAMIN D 500-200 MG-UNIT PO TABS
1.0000 | ORAL_TABLET | Freq: Two times a day (BID) | ORAL | Status: DC
  Administered 2011-03-28 – 2011-03-29 (×2): 1 via ORAL
  Filled 2011-03-28 (×2): qty 1

## 2011-03-28 MED ORDER — SODIUM CHLORIDE 0.9 % IJ SOLN
INTRAMUSCULAR | Status: AC
  Filled 2011-03-28: qty 3

## 2011-03-28 MED ORDER — VITAMIN B-12 1000 MCG PO TABS
1000.0000 ug | ORAL_TABLET | Freq: Every day | ORAL | Status: DC
  Administered 2011-03-29: 1000 ug via ORAL
  Filled 2011-03-28 (×5): qty 1

## 2011-03-28 MED ORDER — IOHEXOL 350 MG/ML SOLN
INTRAVENOUS | Status: DC | PRN
  Administered 2011-03-28: 10 mL

## 2011-03-28 MED ORDER — DEXTROSE 5 % IV SOLN: 1.0000 g | Freq: Once | INTRAVENOUS | Status: DC

## 2011-03-28 MED ORDER — EPHEDRINE SULFATE 50 MG/ML IJ SOLN
INTRAMUSCULAR | Status: DC | PRN
  Administered 2011-03-28 (×2): 10 mg via INTRAVENOUS

## 2011-03-28 MED ORDER — HYDROCODONE-ACETAMINOPHEN 5-325 MG PO TABS
1.0000 | ORAL_TABLET | ORAL | Status: DC | PRN
  Administered 2011-03-29 (×2): 1 via ORAL
  Filled 2011-03-28 (×2): qty 1

## 2011-03-28 MED ORDER — DEXTROSE-NACL 5-0.45 % IV SOLN: INTRAVENOUS | Status: DC

## 2011-03-28 MED ORDER — DEXTROSE 5 % IV SOLN
1.0000 g | INTRAVENOUS | Status: DC | PRN
  Administered 2011-03-28: 1 g via INTRAVENOUS

## 2011-03-28 MED ORDER — BUPIVACAINE IN DEXTROSE 0.75-8.25 % IT SOLN
INTRATHECAL | Status: AC
  Filled 2011-03-28: qty 2

## 2011-03-28 MED ORDER — MUPIROCIN 2 % EX OINT
TOPICAL_OINTMENT | Freq: Two times a day (BID) | CUTANEOUS | Status: DC
  Administered 2011-03-28 – 2011-03-29 (×2): via NASAL
  Filled 2011-03-28: qty 22

## 2011-03-28 MED ORDER — ACETAMINOPHEN 325 MG PO TABS: 650.0000 mg | ORAL_TABLET | ORAL | Status: DC | PRN

## 2011-03-28 MED ORDER — HYDROCORTISONE 2.5 % RE CREA
1.0000 "application " | TOPICAL_CREAM | Freq: Every day | RECTAL | Status: DC
  Administered 2011-03-29: 1 via RECTAL
  Filled 2011-03-28: qty 28.35

## 2011-03-28 MED ORDER — DEXTROSE 5 % IV SOLN
1.0000 g | INTRAVENOUS | Status: DC
  Filled 2011-03-28 (×4): qty 1

## 2011-03-28 MED ORDER — LOPERAMIDE HCL 2 MG PO CAPS: 4.0000 mg | ORAL_CAPSULE | Freq: Every day | ORAL | Status: DC | PRN

## 2011-03-28 MED ORDER — LACTATED RINGERS IV SOLN
INTRAVENOUS | Status: DC
  Administered 2011-03-28 (×2): via INTRAVENOUS

## 2011-03-28 MED ORDER — HYDROCORTISONE 2.5 % RE CREA
TOPICAL_CREAM | RECTAL | Status: AC
  Filled 2011-03-28: qty 28.35

## 2011-03-28 MED ORDER — RISAQUAD PO CAPS
1.0000 | ORAL_CAPSULE | Freq: Every day | ORAL | Status: DC
  Filled 2011-03-28 (×2): qty 1

## 2011-03-28 MED ORDER — EPHEDRINE SULFATE 50 MG/ML IJ SOLN
INTRAMUSCULAR | Status: AC
  Filled 2011-03-28: qty 1

## 2011-03-28 MED ORDER — DEXTROSE 5 % IV SOLN
INTRAVENOUS | Status: AC
  Filled 2011-03-28: qty 1

## 2011-03-28 MED ORDER — ALUM & MAG HYDROXIDE-SIMETH 200-200-20 MG/5ML PO SUSP: 30.0000 mL | ORAL | Status: DC | PRN

## 2011-03-28 MED ORDER — FENTANYL CITRATE 0.05 MG/ML IJ SOLN: 25.0000 ug | INTRAMUSCULAR | Status: DC | PRN

## 2011-03-28 MED ORDER — AMLODIPINE BESYLATE 5 MG PO TABS
5.0000 mg | ORAL_TABLET | Freq: Every day | ORAL | Status: DC
  Administered 2011-03-29: 5 mg via ORAL
  Filled 2011-03-28: qty 1

## 2011-03-28 MED ORDER — MIDAZOLAM HCL 2 MG/2ML IJ SOLN
INTRAMUSCULAR | Status: AC
  Filled 2011-03-28: qty 2

## 2011-03-28 MED ORDER — SENNA 8.6 MG PO TABS
2.0000 | ORAL_TABLET | Freq: Every day | ORAL | Status: DC
  Administered 2011-03-29: 17.2 mg via ORAL
  Filled 2011-03-28: qty 2

## 2011-03-28 MED ORDER — SIMVASTATIN 20 MG PO TABS
20.0000 mg | ORAL_TABLET | Freq: Every day | ORAL | Status: DC
  Administered 2011-03-28: 20 mg via ORAL
  Filled 2011-03-28: qty 1

## 2011-03-28 MED ORDER — TUBERCULIN PPD 5 UNIT/0.1ML ID SOLN: 5.0000 [IU] | Freq: Once | INTRADERMAL | Status: DC

## 2011-03-28 MED ORDER — OXYBUTYNIN CHLORIDE ER 5 MG PO TB24
10.0000 mg | ORAL_TABLET | Freq: Every day | ORAL | Status: DC
  Administered 2011-03-29: 10 mg via ORAL
  Filled 2011-03-28: qty 1

## 2011-03-28 MED ORDER — SODIUM CHLORIDE 0.9 % IR SOLN
Status: DC | PRN
  Administered 2011-03-28: 3000 mL

## 2011-03-28 MED ORDER — PROPOFOL 10 MG/ML IV EMUL
INTRAVENOUS | Status: DC | PRN
  Administered 2011-03-28 (×3): 10 mg via INTRAVENOUS

## 2011-03-28 MED ORDER — ESCITALOPRAM OXALATE 10 MG PO TABS
20.0000 mg | ORAL_TABLET | Freq: Every day | ORAL | Status: DC
  Administered 2011-03-29: 20 mg via ORAL
  Filled 2011-03-28: qty 2

## 2011-03-28 MED ORDER — DIAZEPAM 5 MG PO TABS: 10.0000 mg | ORAL_TABLET | Freq: Once | ORAL | Status: DC

## 2011-03-28 MED ORDER — MIDAZOLAM HCL 2 MG/2ML IJ SOLN
1.0000 mg | INTRAMUSCULAR | Status: DC | PRN
  Administered 2011-03-28: 2 mg via INTRAVENOUS

## 2011-03-28 MED ORDER — PROPOFOL 10 MG/ML IV EMUL
INTRAVENOUS | Status: AC
  Filled 2011-03-28: qty 20

## 2011-03-28 MED ORDER — PANTOPRAZOLE SODIUM 40 MG PO TBEC
40.0000 mg | DELAYED_RELEASE_TABLET | Freq: Every day | ORAL | Status: DC
  Administered 2011-03-29: 40 mg via ORAL
  Filled 2011-03-28: qty 1

## 2011-03-28 MED ORDER — SODIUM CHLORIDE 0.9 % IR SOLN
Status: DC | PRN
  Administered 2011-03-28: 2000 mL

## 2011-03-28 MED ORDER — PROPOFOL 10 MG/ML IV EMUL
INTRAVENOUS | Status: DC | PRN
  Administered 2011-03-28: 25 ug/kg/min via INTRAVENOUS

## 2011-03-28 MED ORDER — FENTANYL CITRATE 0.05 MG/ML IJ SOLN
INTRAMUSCULAR | Status: AC
  Filled 2011-03-28: qty 2

## 2011-03-28 SURGICAL SUPPLY — 28 items
BAG DRAIN URO TABLE W/ADPT NS (DRAPE) ×5 IMPLANT
BAG DRN 8 ADPR NS SKTRN CSTL (DRAPE) ×4
BAG HAMPER (MISCELLANEOUS) ×5 IMPLANT
BAG URINE DRAINAGE (UROLOGICAL SUPPLIES) ×2 IMPLANT
CATH FOLEY 2WAY SLVR  5CC 16FR (CATHETERS) ×1
CATH FOLEY 2WAY SLVR 5CC 16FR (CATHETERS) ×1 IMPLANT
CATH OPEN TIP 5FR (CATHETERS) ×2 IMPLANT
CATH WEDGE TIP 5FR (CATHETERS) ×5 IMPLANT
CLOTH BEACON ORANGE TIMEOUT ST (SAFETY) ×5 IMPLANT
COVER LIGHT HANDLE STERIS (MISCELLANEOUS) ×10 IMPLANT
DILATOR BALLN URETERAL SET (BALLOONS) ×3 IMPLANT
DILATOR UROMAX ULTRA (MISCELLANEOUS) IMPLANT
EVACUATOR ELLICK (MISCELLANEOUS) ×5 IMPLANT
GLOVE BIO SURGEON STRL SZ7 (GLOVE) ×5 IMPLANT
GOWN BRE IMP SLV AUR XL STRL (GOWN DISPOSABLE) ×5 IMPLANT
GUIDEWIRE STR BENTSON 035X150 (WIRE) ×2 IMPLANT
IV NS IRRIG 3000ML ARTHROMATIC (IV SOLUTION) ×12 IMPLANT
KIT ROOM TURNOVER AP CYSTO (KITS) ×5 IMPLANT
LASER FIBER DISP (UROLOGICAL SUPPLIES) IMPLANT
LASER FIBER DISP 1000U (UROLOGICAL SUPPLIES) ×2 IMPLANT
MANIFOLD NEPTUNE II (INSTRUMENTS) ×5 IMPLANT
PACK CYSTO (CUSTOM PROCEDURE TRAY) ×5 IMPLANT
PAD ARMBOARD 7.5X6 YLW CONV (MISCELLANEOUS) ×5 IMPLANT
STENT PERCUFLEX 4.8FRX24 (STENTS) ×2 IMPLANT
STONE RETRIEVAL GEMINI 2.4 FR (MISCELLANEOUS) IMPLANT
SYRINGE 10CC LL (SYRINGE) ×2 IMPLANT
TOWEL OR 17X26 4PK STRL BLUE (TOWEL DISPOSABLE) ×5 IMPLANT
WIRE GUIDE BENTSON .035 15CM (WIRE) ×5 IMPLANT

## 2011-03-28 NOTE — Transfer of Care (Signed)
Immediate Anesthesia Transfer of Care Note  Patient: [name] [name]  Procedure(s) Performed:  CYSTOSCOPY WITH LITHOLAPAXY; HOLMIUM LASER APPLICATION - bladder calculus lithotripsy; CYSTOSCOPY WITH RETROGRADE PYELOGRAM/URETERAL STENT PLACEMENT  Patient Location: PACU  Anesthesia Type: Spinal  Level of Consciousness: awake, alert  and confused  Airway & Oxygen Therapy: Patient Spontanous Breathing and Patient connected to nasal cannula oxygen  Post-op Assessment: Report given to PACU RN  Post vital signs: Reviewed and stable  Complications: No apparent anesthesia complications89/46,  Ephedrine 10mg  iv

## 2011-03-28 NOTE — Progress Notes (Signed)
Nursing facility called concerning Mupricin ointment. Total of 3 doses have been given at the nursing home next dosage is due this pm.

## 2011-03-28 NOTE — Anesthesia Preprocedure Evaluation (Addendum)
Anesthesia Evaluation  Patient identified by MRN, date of birth, ID band Patient awake  General Assessment Comment  Reviewed: Allergy & Precautions, H&P , NPO status , Patient's Chart, lab work & pertinent test results  Airway Mallampati: II      Dental  (+) Teeth Intact   Pulmonary (+) shortness of breath COPD   Pulmonary exam normal       Cardiovascular hypertension, Pt. on medications Regular     Neuro/Psych PSYCHIATRIC DISORDERS ([place]) Depression    GI/Hepatic GERD Medicated and Controlled  Endo/Other  Diabetes mellitus-, Well Controlled, Type 2, Oral Hypoglycemic Agents  Renal/GU      Musculoskeletal   Abdominal   Peds  Hematology   Anesthesia Other Findings   Reproductive/Obstetrics                           Anesthesia Physical Anesthesia Plan  ASA: III  Anesthesia Plan: Spinal   Post-op Pain Management:    Induction:   Airway Management Planned: Nasal Cannula  Additional Equipment:   Intra-op Plan:   Post-operative Plan:   Informed Consent: I have reviewed the patients History and Physical, chart, labs and discussed the procedure including the risks, benefits and alternatives for the proposed anesthesia with the patient or authorized representative who has indicated his/her understanding and acceptance.     Plan Discussed with:   Anesthesia Plan Comments:         Anesthesia Quick Evaluation

## 2011-03-28 NOTE — H&P (Signed)
NAME:  [name] [name], [name] [name]                ACCOUNT NO.:  [id number]  MEDICAL RECORD NO.:  [id number]  LOCATION:                                 FACILITY:  PHYSICIAN:  Ky Barban, M.D.DATE OF BIRTH:  12/08/1928  DATE OF ADMISSION: DATE OF DISCHARGE:  LH                             HISTORY & PHYSICAL   The patient is a 77 year old female having recurrent urinary tract infection.  I have known this patient for several years but I have not seen her for several years, and when I seen a few years ago, at that time, she was in renal failure with right ureteral calculus.  I went and removed this stone and then left a double-J stent.  She was never brought for me to remove the stent.  Now she is having recurrent infection.  Finally, her daughter brought her here.  She is a resident of local nursing home and I find out that she still has stent which has calcified on both ends and she still has a staghorn calculus in the right kidney.  So I decided to do lithotripsy which I have done. Hopefully it will break the stone around the upper end of the stent. The stone which is around the lower end of the stent can be easily removed at that time with litholapaxy and once I do that then I again see if the stent easily slides out and I have told the patient if I cannot do that then different technique will be used later on to get the stent out.  I have discussed the possible complications of this removal of stent.  One possible complication is breakage of the stent requiring more additional procedure.  The patient's daughter and the patient understand, want me to go ahead and proceed.  PAST MEDICAL HISTORY:  History of dementia, pulmonary embolism, depression, anemia and GE reflux.  PERSONAL HISTORY:  Does not smoke or drink.  REVIEW OF SYSTEM:  Denies any chest pain, orthopnea, PND, nausea, vomiting, fever, or chills.  PHYSICAL EXAMINATION:  GENERAL:  Moderately built female not in  acute distress, sitting in the wheelchair. VITAL SIGNS:  Blood pressure 170/80, temperature is normal. CENTRAL NERVOUS SYSTEM:  No gross neurological deficit. HEAD, NECK, ENT:  Negative. CHEST:  Symmetrical. HEART:  Regular sinus rhythm.  ABDOMEN:  Soft, flat.  Liver, spleen, kidneys are not palpable. BACK:  No CVA tenderness. PELVIC:  Deferred. EXTREMITIES:  Normal.  IMPRESSION:  Right ureteral stent calcification in the bladder and the kidney.  PLAN:  Litholapaxy and at time to remove the stent, change it with a new stent.     Ky Barban, M.D.     MIJ/MEDQ  D:  03/27/2011  T:  03/27/2011  Job:  [id number]

## 2011-03-28 NOTE — Progress Notes (Signed)
PT. Examined H&P reviewed no change.

## 2011-03-28 NOTE — Anesthesia Procedure Notes (Addendum)
Spinal Block  Patient location during procedure: OR Start time: 03/28/2011 1:35 PM Staffing CRNA/Resident: Glynn Octave Preanesthetic Checklist Completed: patient identified, site marked, surgical consent, pre-op evaluation, timeout performed, IV checked, risks and benefits discussed and monitors and equipment checked Spinal Block Patient position: right lateral decubitus Prep: Betadine Patient monitoring: heart rate, cardiac monitor, continuous pulse ox and blood pressure Approach: midline Location: L3-4 Injection technique: single-shot Needle Needle type: Sprotte and Spinocan  Needle gauge: 22 G Needle length: 9 cm Assessment Sensory level: T6 Additional Notes 1356: Marcaine .75%, 1.8cc with fentanyl injected.  Positive CSF aspiration pre and post injection. Tray #[id number] Exp. 01/2012

## 2011-03-28 NOTE — Brief Op Note (Signed)
03/28/2011  2:56 PM  PATIENT:  [name] [name]  77 y.o. female  PRE-OPERATIVE DIAGNOSIS:  post ESL right kidney  POST-OPERATIVE DIAGNOSIS:  * No post-op diagnosis entered *  PROCEDURE:  Procedure(s): CYSTOSCOPY WITH LITHOLAPAXY HOLMIUM LASER APPLICATION CYSTOSCOPY WITH RETROGRADE PYELOGRAM/URETERAL STENT PLACEMENT  SURGEON:  Surgeon(s): Ky Barban  PHYSICIAN ASSISTANT:   ASSISTANTS: none   ANESTHESIA:   spinal  EBL:  Total I/O In: 1000 [I.V.:1000] Out: 0   BLOOD ADMINISTERED:none  DRAINS: Urinary Catheter (Foley)   LOCAL MEDICATIONS USED:  NONE  SPECIMEN:  Source of Specimen:  bladder calculi  DISPOSITION OF SPECIMEN:  PATHOLOGY  COUNTS:  YES  TOURNIQUET:  * No tourniquets in log *  DICTATION: .Other Dictation: Dictation Number [id number]  PLAN OF CARE: Admit for overnight observation  PATIENT DISPOSITION:  PACU - hemodynamically stable.   Delay start of Pharmacological VTE agent (>24hrs) due to surgical blood loss or risk of bleeding:  not applicable

## 2011-03-28 NOTE — Anesthesia Postprocedure Evaluation (Signed)
  Anesthesia Post-op Note  Patient: [name] [name]  Procedure(s) Performed:  CYSTOSCOPY WITH LITHOLAPAXY; HOLMIUM LASER APPLICATION - bladder calculus lithotripsy; CYSTOSCOPY WITH RETROGRADE PYELOGRAM/URETERAL STENT PLACEMENT  Patient Location: PACU  Anesthesia Type: Spinal  Level of Consciousness: awake, alert  and confused  Airway and Oxygen Therapy: Patient Spontanous Breathing and Patient connected to nasal cannula oxygen  Post-op Pain: none  Post-op Assessment: Patient's Cardiovascular Status Stable, Respiratory Function Stable and No signs of Nausea or vomiting  Post-op Vital Signs: Reviewed  Complications: No apparent anesthesia complicationsT12,  BP 111/50

## 2011-03-29 LAB — BASIC METABOLIC PANEL
CO2: 27 mEq/L (ref 19–32)
Calcium: 9.3 mg/dL (ref 8.4–10.5)
Potassium: 4.4 mEq/L (ref 3.5–5.1)
Sodium: 138 mEq/L (ref 135–145)

## 2011-03-29 LAB — GLUCOSE, CAPILLARY
Glucose-Capillary: 98 mg/dL (ref 70–99)
Glucose-Capillary: 145 mg/dL — ABNORMAL HIGH (ref 70–99)

## 2011-03-29 LAB — CBC
MCV: 86.1 fL (ref 78.0–100.0)
Platelets: 357 10*3/uL (ref 150–400)
RBC: 3.96 MIL/uL (ref 3.87–5.11)
WBC: 9.3 10*3/uL (ref 4.0–10.5)

## 2011-03-29 MED ORDER — FLORA-Q PO CAPS
1.0000 | ORAL_CAPSULE | Freq: Every day | ORAL | Status: DC
  Administered 2011-03-29: 1 via ORAL
  Filled 2011-03-29: qty 1

## 2011-03-29 NOTE — Anesthesia Postprocedure Evaluation (Signed)
Anesthesia Post Note  Patient: [name] [name]  Procedure(s) Performed:  CYSTOSCOPY WITH LITHOLAPAXY; HOLMIUM LASER APPLICATION - bladder calculus lithotripsy; CYSTOSCOPY WITH RETROGRADE PYELOGRAM/URETERAL STENT PLACEMENT  Anesthesia type: Spinal  Patient location: 335  Post pain: Pain level controlled  Post assessment: Post-op Vital signs reviewed, Patient's Cardiovascular Status Stable, Respiratory Function Stable, Patent Airway, No signs of Nausea or vomiting and Pain level controlled  Last Vitals:  Filed Vitals:   03/29/11 0620  BP: 102/50  Pulse: 65  Temp: 36.6 C  Resp: 16    Post vital signs: Reviewed and stable  Level of consciousness: awake and alert, confused   Complications: No apparent anesthesia complications

## 2011-03-29 NOTE — Progress Notes (Signed)
NAME:  [name] [name], [name] [name]                ACCOUNT NO.:  [id number]  MEDICAL RECORD NO.:  [id number]  LOCATION:  A335                          FACILITY:  [facility]  PHYSICIAN:  Ky Barban, M.D.DATE OF BIRTH:  1928/12/31  DATE OF PROCEDURE: DATE OF DISCHARGE:                                PROGRESS NOTE   She had litholapaxy and change of her double-J stent done yesterday. Clinically, she is doing well.  She is having lot of bladder spasms because she has a Foley catheter.  Her lab workup from this morning; sodium 138, potassium 4.4, chloride 103, CO2 is 27, BUN is 30, creatinine 1.93.  WBC count 9.3, hematocrit 34.1.  Her creatinine is slightly abnormal, but I reviewed her previous creatinine, it has been as high as 2, so I will repeat it again when she come back to the office in couple of weeks.  We are going to go ahead and discontinue her Foley catheter.  Hopefully that will take care of her bladder stress and after taking out the Foley catheter we will watch and see how she is doing.  She can be discharged home today and then I will see her back in 2 weeks with a KUB in the office.  She can go back to the nursing home and continue her previous medications.     Ky Barban, M.D.     MIJ/MEDQ  D:  03/29/2011  T:  03/29/2011  Job:  [id number]

## 2011-03-29 NOTE — Op Note (Signed)
NAME:  [name] [name], [name] [name]                ACCOUNT NO.:  [id number]  MEDICAL RECORD NO.:  [id number]  LOCATION:                                 FACILITY:  PHYSICIAN:  Ky Barban, M.D.DATE OF BIRTH:  10/31/1928  DATE OF PROCEDURE: DATE OF DISCHARGE:                              OPERATIVE REPORT   PREOPERATIVE DIAGNOSES: 1. Right double-J stent forgotten. 2. Bladder calculi on the stent.  PROCEDURE:  Litholapaxy, right retrograde pyelogram, right ureteroscopy, change double-J stent.  ANESTHESIA:  Spinal.  DESCRIPTION OF PROCEDURE:  The patient under spinal anesthesia, after usual prep and drape, #25 cystoscope introduced into the bladder.  It was inspected with large bladder stone is seen which is formed over the lower end of the double-J stent which was forgotten by the patient and the family, and it is located in the right ureter.  I have done lithotripsy.  There is a stone which was stuck to the upper end of the stent and looks like it is broken away, but he still has staghorn calculus on that side.  So, I introduced a laser scope in the bladder, through that using 1000 micron laser fiber, these bladder stone was treated.  It nicely came off the stent, and once the stone was removed from this end of the stent, I tried to pass a guidewire alongside the false stent, but it will get stuck at the level of the common iliac vessel crossing.  I injected the dye.  There is obstruction at that point.  I suspect that there may be a stone in that area, but anyway I tried to take against without the guidewire.  I thought I put the guidewire, in case some problem happens with the other upper end, I will have access to the collecting system.  I grabbed the lower end of the stent with the help of a foreign body forceps and under fluoroscopy. Then, I removed it.  I could see the upper end had straighten up and gently I slide the stent out.  Under fluoroscopic control, it came out without  any obstruction or without any problem.  The entire stent was removed without any loss of it.  Then, I put a guidewire, which went up into the renal pelvis, and over the guidewire, I introduced open-end catheter to confirm the position of the guidewire.  I could outline the collecting system and the upper ureter which is dilated, and at this point, I left the guidewire, then I went alongside the guidewire.  I could not see any stone in the area of the obstruction, but I could not go beyond that site, at this point, I decided to just put a second new double-J stent and come out.  The ureteroscope was removed, and under fluoroscopic control, a double-J stent is passed over the guidewire.  It was positioned between the renal pelvis and the bladder, and the bladder calculi pieces were removed with the help of Ellik evacuator. Bladder is checked again and looks fine.  I do not see any residual stones in the bladder and the double-J stent.  Nice loop is in the renal pelvis and the bladder.  All the  instruments were removed.  The patient left the operating room in satisfactory condition.     Ky Barban, M.D.     MIJ/MEDQ  D:  03/28/2011  T:  03/29/2011  Job:  [id number]

## 2011-03-29 NOTE — Addendum Note (Signed)
Addendum  created 03/29/11 1349 by Minerva Areola, CRNA   Modules edited:Notes Section

## 2011-03-29 NOTE — Progress Notes (Signed)
Report called to Enid Cutter, Rn at [facility]. Pt sent with discharge paperwork and discharge progress note from Dr. Jerre Simon. Pt accompanied to awaiting vehicle via wheelchair. No distress noted at discharge.

## 2011-03-29 NOTE — Progress Notes (Signed)
NAME:  [name] [name], [name] [name]                ACCOUNT NO.:  [id number]  MEDICAL RECORD NO.:  [id number]  LOCATION:  A335                          FACILITY:  [facility]  PHYSICIAN:  Laylanie Kruczek D. Felecia Shelling, MD   DATE OF BIRTH:  10-03-1928  DATE OF PROCEDURE:  03/29/2011 DATE OF DISCHARGE:                                PROGRESS NOTE   The patient was admitted under Urology Service due to renal calculi. She has planned for cystoscopy and extracorporeal shockwave lithotripsy. The patient is found to have right-sided renal calculi.  The patient has also history of recurrent urinary tract infection.  SUBJECTIVE:  The patient is confused and disoriented unable to give history.  OBJECTIVE:  GENERAL:  The patient is awake, but confused and disoriented.  She is chronically sick looking. VITAL SIGNS:  Blood pressure 89/46, pulse 66, respiratory rate 20, temperature 97.8 degrees Fahrenheit. CHEST:  Decreased air entry, few rhonchi. CARDIOVASCULAR:  First and second heart sounds heard.  No murmur.  No gallop. ABDOMEN:  Soft, and lax.  Bowel sounds positive.  No mass or organomegaly. EXTREMITIES:  No leg edema.  LABORATORY DATA:  WBC 9.3, hemoglobin 11.2, hematocrit 34.0, and platelets 357.  BMP; sodium 138, potassium 4.4, chloride 103, carbon dioxide 27, glucose 128, BUN 30, creatinine 1.3, calcium 9.3.  ASSESSMENT: 1. Right renal calculi and status post lithotripsy and cystoscopy. 2. Advanced dementia. 3. History of chronic obstructive pulmonary disease. 4. Hypertension.  PLAN:  We will continue the patient on her regular medications. Continue on current treatment according to Urology recommendation.  We will continue to assist in her daily living activity.  We will continue to follow up in nursing home once the patient is discharged.     Gailene Youkhana D. Felecia Shelling, MD    TDF/MEDQ  D:  03/29/2011  T:  03/29/2011  Job:  [id number]

## 2011-03-29 NOTE — Progress Notes (Signed)
Pt to D/C today to [facility].  CSW spoke with Pt's family and with Debbie at [facility].  Both are in agreement with plan. Pt to be transported by family.  CSW will sign off at this time.

## 2011-03-29 NOTE — Progress Notes (Signed)
[phone]

## 2011-03-30 LAB — URINE CULTURE

## 2011-04-04 ENCOUNTER — Encounter (HOSPITAL_COMMUNITY): Payer: Self-pay | Admitting: Urology

## 2011-04-11 ENCOUNTER — Ambulatory Visit (HOSPITAL_COMMUNITY)
Admission: RE | Admit: 2011-04-11 | Discharge: 2011-04-11 | Disposition: A | Discharge status: Home / Self Care | Payer: PRIVATE HEALTH INSURANCE | Source: Ambulatory Visit | Attending: Urology | Admitting: Urology

## 2011-04-11 ENCOUNTER — Other Ambulatory Visit (HOSPITAL_COMMUNITY): Payer: Self-pay | Admitting: Urology

## 2011-04-11 DIAGNOSIS — N200 Calculus of kidney: Secondary | ICD-10-CM

## 2011-04-11 DIAGNOSIS — R109 Unspecified abdominal pain: Secondary | ICD-10-CM | POA: Insufficient documentation

## 2011-07-05 ENCOUNTER — Emergency Department (HOSPITAL_COMMUNITY): Payer: PRIVATE HEALTH INSURANCE

## 2011-07-05 ENCOUNTER — Encounter (HOSPITAL_COMMUNITY): Payer: Self-pay

## 2011-07-05 ENCOUNTER — Emergency Department (HOSPITAL_COMMUNITY)
Admission: EM | Admit: 2011-07-05 | Discharge: 2011-07-05 | Disposition: A | Discharge status: Home / Self Care | Payer: PRIVATE HEALTH INSURANCE | Attending: Emergency Medicine | Admitting: Emergency Medicine

## 2011-07-05 DIAGNOSIS — Y998 Other external cause status: Secondary | ICD-10-CM | POA: Insufficient documentation

## 2011-07-05 DIAGNOSIS — Z79899 Other long term (current) drug therapy: Secondary | ICD-10-CM | POA: Insufficient documentation

## 2011-07-05 DIAGNOSIS — Y9210 Unspecified residential institution as the place of occurrence of the external cause: Secondary | ICD-10-CM | POA: Insufficient documentation

## 2011-07-05 DIAGNOSIS — F329 Major depressive disorder, single episode, unspecified: Secondary | ICD-10-CM | POA: Insufficient documentation

## 2011-07-05 DIAGNOSIS — F3289 Other specified depressive episodes: Secondary | ICD-10-CM | POA: Insufficient documentation

## 2011-07-05 DIAGNOSIS — S0100XA Unspecified open wound of scalp, initial encounter: Secondary | ICD-10-CM | POA: Insufficient documentation

## 2011-07-05 DIAGNOSIS — N189 Chronic kidney disease, unspecified: Secondary | ICD-10-CM | POA: Insufficient documentation

## 2011-07-05 DIAGNOSIS — W19XXXA Unspecified fall, initial encounter: Secondary | ICD-10-CM | POA: Insufficient documentation

## 2011-07-05 DIAGNOSIS — J4489 Other specified chronic obstructive pulmonary disease: Secondary | ICD-10-CM | POA: Insufficient documentation

## 2011-07-05 DIAGNOSIS — I129 Hypertensive chronic kidney disease with stage 1 through stage 4 chronic kidney disease, or unspecified chronic kidney disease: Secondary | ICD-10-CM | POA: Insufficient documentation

## 2011-07-05 DIAGNOSIS — S0101XA Laceration without foreign body of scalp, initial encounter: Secondary | ICD-10-CM

## 2011-07-05 DIAGNOSIS — J449 Chronic obstructive pulmonary disease, unspecified: Secondary | ICD-10-CM | POA: Insufficient documentation

## 2011-07-05 DIAGNOSIS — K219 Gastro-esophageal reflux disease without esophagitis: Secondary | ICD-10-CM | POA: Insufficient documentation

## 2011-07-05 DIAGNOSIS — S0990XA Unspecified injury of head, initial encounter: Secondary | ICD-10-CM

## 2011-07-05 DIAGNOSIS — E119 Type 2 diabetes mellitus without complications: Secondary | ICD-10-CM | POA: Insufficient documentation

## 2011-07-05 MED ORDER — LIDOCAINE-EPINEPHRINE (PF) 1 %-1:200000 IJ SOLN
10.0000 mL | Freq: Once | INTRAMUSCULAR | Status: AC
  Administered 2011-07-05: 10 mL
  Filled 2011-07-05 (×2): qty 10

## 2011-07-05 NOTE — ED Notes (Signed)
Pt from [facility], found pt on the floor, apparently hit back of head on edge of sink, has lac to back of head, no apparent loc

## 2011-07-05 NOTE — ED Notes (Addendum)
Report given to [place] at [facility], advised she would call back when they are able to provide transportation

## 2011-07-05 NOTE — ED Notes (Addendum)
Louie Casa, RN from [facility] called back to er and stated that [facility] does not have transportation anymore, that the er will usually sent pt;s back by ems, Louie Casa also advised that the center also uses [facility] but is not sure when they will be here, [facility] contacted for transport Breakfast given to pt

## 2011-07-05 NOTE — ED Notes (Addendum)
Several attempts made to contact [facility], called both numbers listed, no answer, will attempt to call again in a few minutes.

## 2011-07-05 NOTE — ED Notes (Signed)
[facility] here to transport pt to [facility],

## 2011-07-05 NOTE — ED Notes (Signed)
Dr. Juleen China in room placing staples, pt tolerated well, update given

## 2011-07-05 NOTE — ED Provider Notes (Signed)
History    78 year old female presenting to the emergency room after fall. Unfortunately patient is demented and not able to provide much useful history. Patient was found on the floor. Apparently she fell backwards and struck the back of her head against the edge of the sink. No apparent loss of consciousness. Per report patient seems to be at her baseline. Patient complaining of a mild headache and left shoulder pain. She has no other complaints at this time.  CSN: [id number]  Arrival date & time 07/05/11  0609   First MD Initiated Contact with Patient 07/05/11 [phone]      Chief Complaint  Patient presents with  . Fall  . Head Laceration    (Consider location/radiation/quality/duration/timing/severity/associated sxs/prior treatment) HPI  Past Medical History  Diagnosis Date  . Chronic kidney disease     acute renal failure  . COPD (chronic obstructive pulmonary disease)   . Diabetes mellitus   . Depression   . DEMENTIA   . Hypertension   . Shortness of breath   . GERD (gastroesophageal reflux disease)     Past Surgical History  Procedure Date  . Abdominal hysterectomy   . Cystoscopy 2008    with stent placement  . Extracorporeal shock wave lithotripsy 03/13/2011    Procedure: EXTRACORPOREAL SHOCK WAVE LITHOTRIPSY (ESWL);  Surgeon: Ky Barban;  Location: [facility];  Service: Urology;  Laterality: Right;  Right Renal Calculus  . Cystoscopy with litholapaxy 03/28/2011    Procedure: CYSTOSCOPY WITH LITHOLAPAXY;  Surgeon: Ky Barban;  Location: [facility];  Service: Urology;  Laterality: Right;  . Cystoscopy w/ ureteral stent placement 03/28/2011    Procedure: CYSTOSCOPY WITH RETROGRADE PYELOGRAM/URETERAL STENT PLACEMENT;  Surgeon: Ky Barban;  Location: [facility];  Service: Urology;  Laterality: Right;    Family History  Problem Relation Age of Onset  . Anesthesia problems Neg Hx   . Hypotension Neg Hx   . Malignant hyperthermia Neg Hx   . Pseudochol deficiency  Neg Hx     History  Substance Use Topics  . Smoking status: Former Smoker -- 0.5 packs/day for 30 years    Types: Cigarettes    Quit date: 03/25/2003  . Smokeless tobacco: Not on file  . Alcohol Use: No    OB History    Grav Para Term Preterm Abortions TAB SAB Ect Mult Living                  Review of Systems  Level 5 caveat applies. Unable to obtain reliable review of symptoms because of patient's dementia.  Allergies  Review of patient's allergies indicates no known allergies.  Home Medications   Current Outpatient Rx  Name Route Sig Dispense Refill  . ACETAMINOPHEN 325 MG PO TABS Oral Take 650 mg by mouth every 4 (four) hours as needed. Headache/pain/ elevated temp     . ALUMINUM & MAGNESIUM HYDROXIDE 225-200 MG/5ML PO SUSP Oral Take 30 mLs by mouth every 4 (four) hours as needed. indigestion     . AMLODIPINE BESYLATE 5 MG PO TABS Oral Take 5 mg by mouth daily.      . ATORVASTATIN CALCIUM 10 MG PO TABS Oral Take 10 mg by mouth daily.    Marland Kitchen CALCIUM CARB-CHOLECALCIFEROL 500-400 MG-UNIT PO TABS Oral Take 1 tablet by mouth 2 (two) times daily.      Marland Kitchen ESCITALOPRAM OXALATE 20 MG PO TABS Oral Take 20 mg by mouth daily.      Marland Kitchen FLUTICASONE PROPIONATE 50 MCG/ACT NA  SUSP Nasal Place 2 sprays into the nose daily. allergies     . HYDROCODONE-ACETAMINOPHEN 5-325 MG PO TABS Oral Take 1 tablet by mouth every 4 (four) hours as needed. pain     . HYDROCORTISONE 2.5 % RE CREA Rectal Place 1 application rectally daily. hemorrhoids     . LOPERAMIDE HCL 2 MG PO CAPS Oral Take 4 mg by mouth daily as needed. Takes 2 capsules after 1st loose stool. Then take 1 tab after each loose stool. Do not exceed 16 grams/24 hour period     . OMEPRAZOLE 20 MG PO CPDR Oral Take 20 mg by mouth every morning.      . OXYBUTYNIN CHLORIDE ER 10 MG PO TB24 Oral Take 10 mg by mouth daily.      Marland Kitchen POTASSIUM CITRATE ER 10 MEQ (1080 MG) PO TBCR Oral Take 15 mEq by mouth 3 (three) times daily with meals.    . SENNA 8.6  MG PO TABS Oral Take 2 tablets by mouth daily.      Marland Kitchen VITAMIN B-12 1000 MCG PO TABS Oral Take 1,000 mcg by mouth daily.      Marland Kitchen RISAQUAD PO CAPS Oral Take by mouth daily.      Marland Kitchen SIMVASTATIN 20 MG PO TABS Oral Take 20 mg by mouth at bedtime.        BP 173/60  Pulse 64  Temp(Src) 98.1 F (36.7 C) (Oral)  Resp 16  SpO2 100%  Physical Exam  Nursing note and vitals reviewed. Constitutional: She appears well-developed and well-nourished. No distress.       Sitting up in bed. Acute distress. Pleasantly demented  HENT:  Head: Normocephalic and atraumatic.  Eyes: Conjunctivae and EOM are normal. Pupils are equal, round, and reactive to light. Right eye exhibits no discharge. Left eye exhibits no discharge.  Neck: Neck supple.  Cardiovascular: Normal rate, regular rhythm and normal heart sounds.  Exam reveals no gallop and no friction rub.   No murmur heard. Pulmonary/Chest: Effort normal and breath sounds normal. No respiratory distress.  Abdominal: Soft. She exhibits no distension. There is no tenderness.  Musculoskeletal: She exhibits no edema and no tenderness.       No midline spinal tenderness  Neurological: She is alert. No cranial nerve deficit. She exhibits normal muscle tone. Coordination normal.       Disoriented to time.  Skin: Skin is warm and dry.       1.5 cm laceration to the posterior scalp near the vertex. No active bleeding. Mild soft tissue swelling or knee. No step-off appreciated. No significant bony tenderness.  Psychiatric: Her behavior is normal. Thought content normal.    ED Course  Procedures (including critical care time)  LACERATION REPAIR Performed by: Raeford Razor Authorized by: Raeford Razor Consent: Verbal consent obtained. Risks and benefits: risks, benefits and alternatives were discussed Consent given by: patient Patient identity confirmed: provided demographic data Prepped and Draped in normal sterile fashion Wound explored  Laceration  Location: L posterior scalp  Laceration Length: 1.5 cm  No Foreign Bodies seen or palpated  Anesthesia: local infiltration  Local anesthetic: lidocaine 1%  w epinephrine  Anesthetic total: 2 ml  Irrigation method: syringe Amount of cleaning: standard  Skin closure: staples  Number of sutures: 3  Technique: stapled  Patient tolerance: Patient tolerated the procedure well with no immediate complications.  Labs Reviewed - No data to display Ct Head Wo Contrast  07/05/2011  *RADIOLOGY REPORT*  Clinical Data:  Fall, head  laceration  CT HEAD WITHOUT CONTRAST CT CERVICAL SPINE WITHOUT CONTRAST  Technique:  Multidetector CT imaging of the head and cervical spine was performed following the standard protocol without intravenous contrast.  Multiplanar CT image reconstructions of the cervical spine were also generated.  Comparison:  Head CT 12/22/2003  CT HEAD  Findings: There is a small scalp hematoma over the left parietal bone.  No evidence of skull fracture.  No intracranial hemorrhage. No midline shift or mass effect.  There is periventricular subcortical white matter hypodensities.  No evidence skull base fracture.  Orbits are normal.  IMPRESSION:  1.  No intracranial trauma. 2.  Small scalp hematoma.  CT CERVICAL SPINE  Findings: No prevertebral soft tissue swelling.  There is endplate osteophytosis from C4-C7 anteriorly and posteriorly.  There is loss of the body height at these levels which is degenerative in nature. No evidence of acute fracture.  Normal facet articulation.  Normal craniocervical junction.  No epidural paraspinal hematoma.  IMPRESSION:  1.  No evidence of cervical spine fracture. 2.  Multilevel disc osteophytic disease.  Original Report Authenticated By: Genevive Bi, M.D.   Ct Cervical Spine Wo Contrast  07/05/2011  *RADIOLOGY REPORT*  Clinical Data:  Fall, head laceration  CT HEAD WITHOUT CONTRAST CT CERVICAL SPINE WITHOUT CONTRAST  Technique:  Multidetector CT imaging  of the head and cervical spine was performed following the standard protocol without intravenous contrast.  Multiplanar CT image reconstructions of the cervical spine were also generated.  Comparison:  Head CT 12/22/2003  CT HEAD  Findings: There is a small scalp hematoma over the left parietal bone.  No evidence of skull fracture.  No intracranial hemorrhage. No midline shift or mass effect.  There is periventricular subcortical white matter hypodensities.  No evidence skull base fracture.  Orbits are normal.  IMPRESSION:  1.  No intracranial trauma. 2.  Small scalp hematoma.  CT CERVICAL SPINE  Findings: No prevertebral soft tissue swelling.  There is endplate osteophytosis from C4-C7 anteriorly and posteriorly.  There is loss of the body height at these levels which is degenerative in nature. No evidence of acute fracture.  Normal facet articulation.  Normal craniocervical junction.  No epidural paraspinal hematoma.  IMPRESSION:  1.  No evidence of cervical spine fracture. 2.  Multilevel disc osteophytic disease.  Original Report Authenticated By: Genevive Bi, M.D.   Dg Shoulder Left  07/05/2011  *RADIOLOGY REPORT*  Clinical Data: Post fall, now with left shoulder pain  LEFT SHOULDER - 2+ VIEW  Comparison: None.  Findings:  No definite fracture or dislocation.  Minimal degenerative change of the [facility] joint.  Minimal avulsive change of greater tuberosity. No definite evidence of calcific tendonitis.  Regional soft tissues are normal.  Limited visualization of the adjacent thorax is normal.  IMPRESSION: Degenerative change of the shoulder without definite acute process.  Original Report Authenticated By: Waynard Reeds, M.D.     1. Closed head injury   2. Scalp laceration       MDM  78 year old female status post fall. Imaging is negative for acute serious injury. Non focal neuro exam aside from dementia. Scalp laceration which was closed. Continued wound care. Outpatient followup as needed and for  staple removal.        Raeford Razor, MD 07/05/11 [phone]

## 2011-07-05 NOTE — ED Notes (Signed)
Pt has approx 1.5 inch lac to back of head after fall.  Bleeding controlled

## 2011-08-06 ENCOUNTER — Other Ambulatory Visit (HOSPITAL_COMMUNITY): Payer: Self-pay | Admitting: Urology

## 2011-08-06 DIAGNOSIS — N200 Calculus of kidney: Secondary | ICD-10-CM

## 2011-08-07 ENCOUNTER — Ambulatory Visit (HOSPITAL_COMMUNITY)
Admission: RE | Admit: 2011-08-07 | Discharge: 2011-08-07 | Disposition: A | Discharge status: Home / Self Care | Payer: PRIVATE HEALTH INSURANCE | Source: Ambulatory Visit | Attending: Urology | Admitting: Urology

## 2011-08-07 DIAGNOSIS — R1031 Right lower quadrant pain: Secondary | ICD-10-CM | POA: Insufficient documentation

## 2011-08-07 DIAGNOSIS — N200 Calculus of kidney: Secondary | ICD-10-CM | POA: Insufficient documentation

## 2011-08-07 DIAGNOSIS — K8020 Calculus of gallbladder without cholecystitis without obstruction: Secondary | ICD-10-CM | POA: Insufficient documentation

## 2011-09-19 ENCOUNTER — Other Ambulatory Visit (HOSPITAL_COMMUNITY): Payer: Self-pay | Admitting: Urology

## 2011-09-19 ENCOUNTER — Ambulatory Visit (HOSPITAL_COMMUNITY)
Admission: RE | Admit: 2011-09-19 | Discharge: 2011-09-19 | Disposition: A | Discharge status: Home / Self Care | Payer: PRIVATE HEALTH INSURANCE | Source: Ambulatory Visit | Attending: Urology | Admitting: Urology

## 2011-09-19 DIAGNOSIS — N200 Calculus of kidney: Secondary | ICD-10-CM | POA: Insufficient documentation

## 2011-09-24 ENCOUNTER — Encounter (HOSPITAL_COMMUNITY): Payer: Self-pay

## 2011-09-24 ENCOUNTER — Encounter (HOSPITAL_COMMUNITY): Payer: Self-pay | Admitting: Pharmacy Technician

## 2011-09-24 ENCOUNTER — Encounter (HOSPITAL_COMMUNITY)
Admission: RE | Admit: 2011-09-24 | Discharge: 2011-09-24 | Disposition: A | Discharge status: Home / Self Care | Payer: PRIVATE HEALTH INSURANCE | Source: Ambulatory Visit | Attending: Urology | Admitting: Urology

## 2011-09-24 NOTE — Patient Instructions (Addendum)
20 [name] [name]  09/24/2011   Your procedure is scheduled on:   09/25/2011   Report to [facility] at  1130  AM.  Call this number if you have problems the morning of surgery: [phone]   Remember:   Do not eat food:After Midnight.  May have clear liquids:until Midnight .    Take these medicines the morning of surgery with A SIP OF WATER: norvasc, lexapro,prilosec   Do not wear jewelry, make-up or nail polish.  Do not wear lotions, powders, or perfumes. You may wear deodorant.  Do not shave 48 hours prior to surgery.  Do not bring valuables to the hospital.  Contacts, dentures or bridgework may not be worn into surgery.  Leave suitcase in the car. After surgery it may be brought to your room.  For patients admitted to the hospital, checkout time is 11:00 AM the day of discharge.   Patients discharged the day of surgery will not be allowed to drive home.  Name and phone number of your driver: family     Lithotripsy for Kidney Stones WHAT ARE KIDNEY STONES? The kidneys filter blood for chemicals the body cannot use. These waste chemicals are eliminated in the urine. They are removed from the body. Under some conditions, these chemicals may become concentrated. When this happens, they form crystals in the urine. When these crystals build up and stick together, stones may form. When these stones block the flow of urine through the urinary tract, they may cause severe pain. The urinary tract is very sensitive to blockage and stretching by the stone. WHAT IS LITHOTRIPSY? Lithotripsy is a treatment that can sometimes help eliminate kidney stones and pain faster. A form of lithotripsy, also known as ESWL (extracorporeal shock wave lithotripsy), is a nonsurgical procedure that helps your body rid itself of the kidney stone with a minimum amount of pain. EWSL is a method of crushing a kidney stone with shock waves. These shock waves pass through your body. They cause the kidney stones to crumble  while still in the urinary tract. It is then easier for the smaller pieces of stone to pass in the urine. Lithotripsy usually takes about an hour. It is done in a hospital, a lithotripsy center, or a mobile unit. It usually does not require an overnight stay. Your caregiver will instruct you on preparation for the procedure. Your caregiver will tell you what to expect afterward. LET YOUR CAREGIVER KNOW ABOUT:  Allergies.   Medicines taken including herbs, eye drops, over the counter medicines (including aspirin, aleve, or motrin for treatment of inflammatory conditions) and creams.   Use of steroids (by mouth or creams).   Previous problems with anesthetics or novocaine.   Possibility of pregnancy, if this applies.   History of blood clots (thrombophlebitis).   History of bleeding or blood problems.   Previous surgery.   Other health problems.  RISKS AND COMPLICATIONS Complications of lithotripsy are uncommon, but include the following:  Infection.   Bleeding of the kidney.   Bruising of the kidney or skin.   Obstruction of the ureter (the passageway from the kidney to the bladder).   Failure of the stone to fragment (break apart).  PROCEDURE A stent (flexible tube with holes) may be placed in your ureter. The ureter is the tube that transports the urine from the kidneys to the bladder. Your caregiver may place a stent before the procedure. This will help keep urine flowing from the kidney if the fragments of  the stone block the ureter. You may receive an intravenous (IV) line to give you fluids and medicines. These medicines may help you relax or make you sleep. During the procedure, you will lie comfortably on a fluid-filled cushion or in a warm-water bath. After an x-ray or ultrasound locates your stone, shock waves are aimed at the stone. If you are awake, you may feel a tapping sensation (feeling) as the shock waves pass through your body. If large stone particles remain after  treatment, a second procedure may be necessary at a later date. For comfort during the test:  Relax as much as possible.   Try to remain still as much as possible.   Try to follow instructions to speed up the test.   Let your caregiver know if you are uncomfortable, anxious, or in pain.  AFTER THE PROCEDURE  After surgery, you will be taken to the recovery area. A nurse will watch and check your progress. Once you're awake, stable, and taking fluids well, you will be allowed to go home as long as there are no problems. You may be prescribed antibiotics (medicines that kill germs) to help prevent infection. You may also be prescribed pain medicine if needed. In a week or two, your doctor may remove your stent, if you have one. Your caregiver will check to see whether or not stone particles remain. PASSING THE STONE It may take anywhere from a day to several weeks for the stone particles to leave your body. During this time, drink at least 8 to 12 eight ounce glasses of water every day. It is normal for your urine to be cloudy or slightly bloody for a few weeks following this procedure. You may even see small pieces of stone in your urine. A slight fever and some pain are also normal. Your caregiver may ask you to strain your urine to collect some stone particles for chemical analysis. If you find particles while straining the urine, save them. Analysis tells you and the caregiver what the stone is made of. Knowing this may help prevent future stones. PREVENTING FUTURE STONES  Drink about 8 to 12, eight-ounce glasses of water every day.   Follow the diet your caregiver recommends.   Take your prescribed medicine.   See your caregiver regularly for checkups.  SEEK IMMEDIATE MEDICAL CARE IF:  You develop an oral temperature above 102 F (38.9 C), or as your caregiver suggests.   Your pain is not relieved by medicine.   You develop nausea (feeling sick to your stomach) and vomiting.   You  develop heavy bleeding.   You have difficulty urinating.  Document Released: 05/10/2000 Document Revised: 05/02/2011 Document Reviewed: 03/04/2008 [facility] Patient Information 2012 [place], [place].

## 2011-09-25 ENCOUNTER — Ambulatory Visit (HOSPITAL_COMMUNITY)
Admission: RE | Admit: 2011-09-25 | Discharge: 2011-09-25 | Disposition: A | Discharge status: Home / Self Care | Payer: PRIVATE HEALTH INSURANCE | Source: Ambulatory Visit | Attending: Urology | Admitting: Urology

## 2011-09-25 ENCOUNTER — Encounter (HOSPITAL_COMMUNITY)
Admission: RE | Disposition: A | Discharge status: Home / Self Care | Payer: Self-pay | Source: Ambulatory Visit | Attending: Urology

## 2011-09-25 ENCOUNTER — Encounter (HOSPITAL_COMMUNITY): Payer: Self-pay | Admitting: *Deleted

## 2011-09-25 ENCOUNTER — Ambulatory Visit (HOSPITAL_COMMUNITY): Payer: PRIVATE HEALTH INSURANCE

## 2011-09-25 DIAGNOSIS — N200 Calculus of kidney: Secondary | ICD-10-CM | POA: Insufficient documentation

## 2011-09-25 DIAGNOSIS — Z79899 Other long term (current) drug therapy: Secondary | ICD-10-CM | POA: Insufficient documentation

## 2011-09-25 DIAGNOSIS — Z01812 Encounter for preprocedural laboratory examination: Secondary | ICD-10-CM | POA: Insufficient documentation

## 2011-09-25 DIAGNOSIS — J4489 Other specified chronic obstructive pulmonary disease: Secondary | ICD-10-CM | POA: Insufficient documentation

## 2011-09-25 DIAGNOSIS — J449 Chronic obstructive pulmonary disease, unspecified: Secondary | ICD-10-CM | POA: Insufficient documentation

## 2011-09-25 DIAGNOSIS — E119 Type 2 diabetes mellitus without complications: Secondary | ICD-10-CM | POA: Insufficient documentation

## 2011-09-25 DIAGNOSIS — I10 Essential (primary) hypertension: Secondary | ICD-10-CM | POA: Insufficient documentation

## 2011-09-25 HISTORY — PX: EXTRACORPOREAL SHOCK WAVE LITHOTRIPSY: SHX1557

## 2011-09-25 LAB — GLUCOSE, CAPILLARY: Glucose-Capillary: 103 mg/dL — ABNORMAL HIGH (ref 70–99)

## 2011-09-25 SURGERY — LITHOTRIPSY, ESWL
Anesthesia: Moderate Sedation | Laterality: Right

## 2011-09-25 MED ORDER — DIPHENHYDRAMINE HCL 25 MG PO CAPS
ORAL_CAPSULE | ORAL | Status: AC
  Administered 2011-09-25: 25 mg via ORAL
  Filled 2011-09-25: qty 1

## 2011-09-25 MED ORDER — SODIUM CHLORIDE 0.45 % IV SOLN
Freq: Once | INTRAVENOUS | Status: AC
  Administered 2011-09-25: 1000 mL via INTRAVENOUS

## 2011-09-25 MED ORDER — DIPHENHYDRAMINE HCL 25 MG PO CAPS
25.0000 mg | ORAL_CAPSULE | Freq: Once | ORAL | Status: AC
  Administered 2011-09-25: 25 mg via ORAL

## 2011-09-25 MED ORDER — DIAZEPAM 5 MG PO TABS
10.0000 mg | ORAL_TABLET | Freq: Once | ORAL | Status: AC
  Administered 2011-09-25: 10 mg via ORAL

## 2011-09-25 MED ORDER — DIAZEPAM 5 MG PO TABS
ORAL_TABLET | ORAL | Status: AC
  Administered 2011-09-25: 10 mg via ORAL
  Filled 2011-09-25: qty 2

## 2011-09-25 SURGICAL SUPPLY — 3 items
CLOTH BEACON ORANGE TIMEOUT ST (SAFETY) IMPLANT
GOWN STRL REIN XL XLG (GOWN DISPOSABLE) IMPLANT
TOWEL OR 17X26 4PK STRL BLUE (TOWEL DISPOSABLE) IMPLANT

## 2011-09-25 NOTE — Discharge Instructions (Signed)
Lithotripsy for Kidney Stones WHAT ARE KIDNEY STONES? The kidneys filter blood for chemicals the body cannot use. These waste chemicals are eliminated in the urine. They are removed from the body. Under some conditions, these chemicals may become concentrated. When this happens, they form crystals in the urine. When these crystals build up and stick together, stones may form. When these stones block the flow of urine through the urinary tract, they may cause severe pain. The urinary tract is very sensitive to blockage and stretching by the stone. WHAT IS LITHOTRIPSY? Lithotripsy is a treatment that can sometimes help eliminate kidney stones and pain faster. A form of lithotripsy, also known as ESWL (extracorporeal shock wave lithotripsy), is a nonsurgical procedure that helps your body rid itself of the kidney stone with a minimum amount of pain. EWSL is a method of crushing a kidney stone with shock waves. These shock waves pass through your body. They cause the kidney stones to crumble while still in the urinary tract. It is then easier for the smaller pieces of stone to pass in the urine. Lithotripsy usually takes about an hour. It is done in a hospital, a lithotripsy center, or a mobile unit. It usually does not require an overnight stay. Your caregiver will instruct you on preparation for the procedure. Your caregiver will tell you what to expect afterward. LET YOUR CAREGIVER KNOW ABOUT:  Allergies.   Medicines taken including herbs, eye drops, over the counter medicines (including aspirin, aleve, or motrin for treatment of inflammatory conditions) and creams.   Use of steroids (by mouth or creams).   Previous problems with anesthetics or novocaine.   Possibility of pregnancy, if this applies.   History of blood clots (thrombophlebitis).   History of bleeding or blood problems.   Previous surgery.   Other health problems.  RISKS AND COMPLICATIONS Complications of lithotripsy are  uncommon, but include the following:  Infection.   Bleeding of the kidney.   Bruising of the kidney or skin.   Obstruction of the ureter (the passageway from the kidney to the bladder).   Failure of the stone to fragment (break apart).  PROCEDURE A stent (flexible tube with holes) may be placed in your ureter. The ureter is the tube that transports the urine from the kidneys to the bladder. Your caregiver may place a stent before the procedure. This will help keep urine flowing from the kidney if the fragments of the stone block the ureter. You may receive an intravenous (IV) line to give you fluids and medicines. These medicines may help you relax or make you sleep. During the procedure, you will lie comfortably on a fluid-filled cushion or in a warm-water bath. After an x-ray or ultrasound locates your stone, shock waves are aimed at the stone. If you are awake, you may feel a tapping sensation (feeling) as the shock waves pass through your body. If large stone particles remain after treatment, a second procedure may be necessary at a later date. For comfort during the test:  Relax as much as possible.   Try to remain still as much as possible.   Try to follow instructions to speed up the test.   Let your caregiver know if you are uncomfortable, anxious, or in pain.  AFTER THE PROCEDURE  After surgery, you will be taken to the recovery area. A nurse will watch and check your progress. Once you're awake, stable, and taking fluids well, you will be allowed to go home as long as there  are no problems. You may be prescribed antibiotics (medicines that kill germs) to help prevent infection. You may also be prescribed pain medicine if needed. In a week or two, your doctor may remove your stent, if you have one. Your caregiver will check to see whether or not stone particles remain.       PASSING THE STONE It may take anywhere from a day to several weeks for the stone particles to leave  your body. During this time, drink at least 8 to 12 eight ounce glasses of water every day. It is normal for your urine to be cloudy or slightly bloody for a few weeks following this procedure. You may even see small pieces of stone in your urine. A slight fever and some pain are also normal. Your caregiver may ask you to strain your urine to collect some stone particles for chemical analysis. If you find particles while straining the urine, save them. Analysis tells you and the caregiver what the stone is made of. Knowing this may help prevent future stones. PREVENTING FUTURE STONES  Drink about 8 to 12, eight-ounce glasses of water every day.   Follow the diet your caregiver recommends.   Take your prescribed medicine.   See your caregiver regularly for checkups.  SEEK IMMEDIATE MEDICAL CARE IF:  You develop an oral temperature above 102 F (38.9 C), or as your caregiver suggests.   Your pain is not relieved by medicine.   You develop nausea (feeling sick to your stomach) and vomiting.   You develop heavy bleeding.   You have difficulty urinating.  Document Released: 05/10/2000 Document Revised: 05/02/2011 Document Reviewed: 03/04/2008 [facility] Patient Information 2012 [place], [place].     Come to X-ray at 11:00am to have KUB xray and take film to Dr Rudean Haskell office at 12:00

## 2011-09-26 ENCOUNTER — Encounter (HOSPITAL_COMMUNITY): Payer: Self-pay | Admitting: Anesthesiology

## 2011-09-30 MED FILL — Fentanyl Citrate Inj 0.05 MG/ML: INTRAMUSCULAR | Qty: 2.5 | Status: AC

## 2011-09-30 MED FILL — Midazolam HCl Inj 2 MG/2ML (Base Equivalent): INTRAMUSCULAR | Qty: 2.5 | Status: AC

## 2011-10-01 ENCOUNTER — Encounter (HOSPITAL_COMMUNITY): Payer: Self-pay | Admitting: Urology

## 2011-10-02 ENCOUNTER — Ambulatory Visit (HOSPITAL_COMMUNITY)
Admission: RE | Admit: 2011-10-02 | Discharge: 2011-10-02 | Disposition: A | Discharge status: Home / Self Care | Payer: PRIVATE HEALTH INSURANCE | Source: Ambulatory Visit | Attending: Urology | Admitting: Urology

## 2011-10-02 ENCOUNTER — Other Ambulatory Visit (HOSPITAL_COMMUNITY): Payer: Self-pay | Admitting: Urology

## 2011-10-02 DIAGNOSIS — N200 Calculus of kidney: Secondary | ICD-10-CM

## 2011-11-12 ENCOUNTER — Ambulatory Visit (HOSPITAL_COMMUNITY)
Admission: RE | Admit: 2011-11-12 | Discharge: 2011-11-12 | Disposition: A | Discharge status: Home / Self Care | Payer: PRIVATE HEALTH INSURANCE | Source: Ambulatory Visit | Attending: Urology | Admitting: Urology

## 2011-11-12 ENCOUNTER — Other Ambulatory Visit (HOSPITAL_COMMUNITY): Payer: Self-pay | Admitting: Urology

## 2011-11-12 DIAGNOSIS — N200 Calculus of kidney: Secondary | ICD-10-CM

## 2011-11-12 DIAGNOSIS — R109 Unspecified abdominal pain: Secondary | ICD-10-CM | POA: Insufficient documentation

## 2012-01-14 ENCOUNTER — Other Ambulatory Visit (HOSPITAL_COMMUNITY): Payer: Self-pay | Admitting: Urology

## 2012-01-14 ENCOUNTER — Ambulatory Visit (HOSPITAL_COMMUNITY)
Admission: RE | Admit: 2012-01-14 | Discharge: 2012-01-14 | Disposition: A | Discharge status: Home / Self Care | Payer: PRIVATE HEALTH INSURANCE | Source: Ambulatory Visit | Attending: Urology | Admitting: Urology

## 2012-01-14 DIAGNOSIS — N201 Calculus of ureter: Secondary | ICD-10-CM | POA: Insufficient documentation

## 2012-01-14 DIAGNOSIS — N200 Calculus of kidney: Secondary | ICD-10-CM | POA: Insufficient documentation

## 2012-02-18 ENCOUNTER — Other Ambulatory Visit (HOSPITAL_COMMUNITY): Payer: Self-pay | Admitting: Urology

## 2012-02-18 DIAGNOSIS — N200 Calculus of kidney: Secondary | ICD-10-CM

## 2012-02-21 ENCOUNTER — Ambulatory Visit (HOSPITAL_COMMUNITY)
Admission: RE | Admit: 2012-02-21 | Discharge: 2012-02-21 | Disposition: A | Discharge status: Home / Self Care | Payer: PRIVATE HEALTH INSURANCE | Source: Ambulatory Visit | Attending: Urology | Admitting: Urology

## 2012-02-21 DIAGNOSIS — K5730 Diverticulosis of large intestine without perforation or abscess without bleeding: Secondary | ICD-10-CM | POA: Insufficient documentation

## 2012-02-21 DIAGNOSIS — N200 Calculus of kidney: Secondary | ICD-10-CM | POA: Insufficient documentation

## 2012-02-21 DIAGNOSIS — R109 Unspecified abdominal pain: Secondary | ICD-10-CM | POA: Insufficient documentation

## 2012-02-21 DIAGNOSIS — K8020 Calculus of gallbladder without cholecystitis without obstruction: Secondary | ICD-10-CM | POA: Insufficient documentation

## 2012-07-03 ENCOUNTER — Ambulatory Visit (INDEPENDENT_AMBULATORY_CARE_PROVIDER_SITE_OTHER): Payer: Medicare Other | Admitting: Urology

## 2012-07-03 ENCOUNTER — Other Ambulatory Visit: Payer: Self-pay | Admitting: Urology

## 2012-07-03 DIAGNOSIS — N200 Calculus of kidney: Secondary | ICD-10-CM

## 2012-07-03 DIAGNOSIS — R82998 Other abnormal findings in urine: Secondary | ICD-10-CM

## 2012-07-09 ENCOUNTER — Ambulatory Visit (HOSPITAL_COMMUNITY): Payer: Medicare Other

## 2012-07-16 ENCOUNTER — Ambulatory Visit (HOSPITAL_COMMUNITY)
Admission: RE | Admit: 2012-07-16 | Discharge: 2012-07-16 | Disposition: A | Discharge status: Home / Self Care | Payer: PRIVATE HEALTH INSURANCE | Source: Ambulatory Visit | Attending: Urology | Admitting: Urology

## 2012-07-16 ENCOUNTER — Other Ambulatory Visit: Payer: Self-pay | Admitting: Urology

## 2012-07-16 DIAGNOSIS — N200 Calculus of kidney: Secondary | ICD-10-CM

## 2012-07-16 DIAGNOSIS — R109 Unspecified abdominal pain: Secondary | ICD-10-CM | POA: Insufficient documentation

## 2012-07-21 ENCOUNTER — Emergency Department (HOSPITAL_COMMUNITY): Payer: PRIVATE HEALTH INSURANCE

## 2012-07-21 ENCOUNTER — Emergency Department (HOSPITAL_COMMUNITY)
Admission: EM | Admit: 2012-07-21 | Discharge: 2012-07-22 | Disposition: A | Discharge status: Home / Self Care | Payer: PRIVATE HEALTH INSURANCE | Attending: Emergency Medicine | Admitting: Emergency Medicine

## 2012-07-21 ENCOUNTER — Encounter (HOSPITAL_COMMUNITY): Payer: Self-pay

## 2012-07-21 DIAGNOSIS — S0100XA Unspecified open wound of scalp, initial encounter: Secondary | ICD-10-CM | POA: Insufficient documentation

## 2012-07-21 DIAGNOSIS — Z79899 Other long term (current) drug therapy: Secondary | ICD-10-CM | POA: Insufficient documentation

## 2012-07-21 DIAGNOSIS — Y9289 Other specified places as the place of occurrence of the external cause: Secondary | ICD-10-CM | POA: Insufficient documentation

## 2012-07-21 DIAGNOSIS — W1789XA Other fall from one level to another, initial encounter: Secondary | ICD-10-CM | POA: Insufficient documentation

## 2012-07-21 DIAGNOSIS — R142 Eructation: Secondary | ICD-10-CM | POA: Insufficient documentation

## 2012-07-21 DIAGNOSIS — Z8709 Personal history of other diseases of the respiratory system: Secondary | ICD-10-CM | POA: Insufficient documentation

## 2012-07-21 DIAGNOSIS — N390 Urinary tract infection, site not specified: Secondary | ICD-10-CM | POA: Insufficient documentation

## 2012-07-21 DIAGNOSIS — Y939 Activity, unspecified: Secondary | ICD-10-CM | POA: Insufficient documentation

## 2012-07-21 DIAGNOSIS — S3981XA Other specified injuries of abdomen, initial encounter: Secondary | ICD-10-CM | POA: Insufficient documentation

## 2012-07-21 DIAGNOSIS — I129 Hypertensive chronic kidney disease with stage 1 through stage 4 chronic kidney disease, or unspecified chronic kidney disease: Secondary | ICD-10-CM | POA: Insufficient documentation

## 2012-07-21 DIAGNOSIS — Z90710 Acquired absence of both cervix and uterus: Secondary | ICD-10-CM | POA: Insufficient documentation

## 2012-07-21 DIAGNOSIS — K219 Gastro-esophageal reflux disease without esophagitis: Secondary | ICD-10-CM | POA: Insufficient documentation

## 2012-07-21 DIAGNOSIS — IMO0002 Reserved for concepts with insufficient information to code with codable children: Secondary | ICD-10-CM | POA: Insufficient documentation

## 2012-07-21 DIAGNOSIS — J4489 Other specified chronic obstructive pulmonary disease: Secondary | ICD-10-CM | POA: Insufficient documentation

## 2012-07-21 DIAGNOSIS — F0390 Unspecified dementia without behavioral disturbance: Secondary | ICD-10-CM | POA: Insufficient documentation

## 2012-07-21 DIAGNOSIS — N189 Chronic kidney disease, unspecified: Secondary | ICD-10-CM | POA: Insufficient documentation

## 2012-07-21 DIAGNOSIS — R141 Gas pain: Secondary | ICD-10-CM | POA: Insufficient documentation

## 2012-07-21 DIAGNOSIS — E119 Type 2 diabetes mellitus without complications: Secondary | ICD-10-CM | POA: Insufficient documentation

## 2012-07-21 DIAGNOSIS — Z87891 Personal history of nicotine dependence: Secondary | ICD-10-CM | POA: Insufficient documentation

## 2012-07-21 DIAGNOSIS — F3289 Other specified depressive episodes: Secondary | ICD-10-CM | POA: Insufficient documentation

## 2012-07-21 LAB — CBC
HCT: 34.8 % — ABNORMAL LOW (ref 36.0–46.0)
Hemoglobin: 11.7 g/dL — ABNORMAL LOW (ref 12.0–15.0)
MCH: 29.4 pg (ref 26.0–34.0)
MCHC: 33.6 g/dL (ref 30.0–36.0)
MCV: 87.4 fL (ref 78.0–100.0)
RBC: 3.98 MIL/uL (ref 3.87–5.11)

## 2012-07-21 LAB — PROTIME-INR: INR: 0.97 (ref 0.00–1.49)

## 2012-07-21 NOTE — ED Notes (Signed)
Pt fell at [place], possibly out of the bed.  Pt has a laceration to back of his head, unknown what pt hit her head on.  Pt is awake, alert, nad

## 2012-07-21 NOTE — ED Provider Notes (Signed)
History     CSN: [id number]  Arrival date & time 07/21/12  2313   First MD Initiated Contact with Patient 07/21/12 2317      Chief Complaint  Patient presents with  . Fall  . Head Laceration    (Consider location/radiation/quality/duration/timing/severity/associated sxs/prior treatment) Patient is a 79 y.o. female presenting with fall and scalp laceration. The history is provided by the patient.  Fall She fell from a height of 1 to 2 ft. She landed on carpet. The volume of blood lost was minimal. The point of impact was the head. The pain is present in the head. The pain is mild. She was not ambulatory at the scene. There was no entrapment after the fall. There was no drug use involved in the accident. There was no alcohol use involved in the accident. Associated symptoms include abdominal pain. Pertinent negatives include no fever, no vomiting and no loss of consciousness. Treatment on scene includes a c-collar and a backboard. She has tried nothing for the symptoms.  Head Laceration Associated symptoms include abdominal pain. Pertinent negatives include no fever or vomiting.    Past Medical History  Diagnosis Date  . Chronic kidney disease     acute renal failure  . COPD (chronic obstructive pulmonary disease)   . Diabetes mellitus   . Depression   . DEMENTIA   . Hypertension   . Shortness of breath   . GERD (gastroesophageal reflux disease)     Past Surgical History  Procedure Laterality Date  . Abdominal hysterectomy    . Cystoscopy  2008    with stent placement  . Extracorporeal shock wave lithotripsy  03/13/2011    Procedure: EXTRACORPOREAL SHOCK WAVE LITHOTRIPSY (ESWL);  Surgeon: Ky Barban;  Location: [facility];  Service: Urology;  Laterality: Right;  Right Renal Calculus  . Cystoscopy with litholapaxy  03/28/2011    Procedure: CYSTOSCOPY WITH LITHOLAPAXY;  Surgeon: Ky Barban;  Location: [facility];  Service: Urology;  Laterality: Right;  . Cystoscopy w/  ureteral stent placement  03/28/2011    Procedure: CYSTOSCOPY WITH RETROGRADE PYELOGRAM/URETERAL STENT PLACEMENT;  Surgeon: Ky Barban;  Location: [facility];  Service: Urology;  Laterality: Right;  . Extracorporeal shock wave lithotripsy  09/25/2011    Procedure: EXTRACORPOREAL SHOCK WAVE LITHOTRIPSY (ESWL);  Surgeon: Ky Barban, MD;  Location: [facility];  Service: Urology;  Laterality: Right;  ESWL Right Renal Calculus    Family History  Problem Relation Age of Onset  . Anesthesia problems Neg Hx   . Hypotension Neg Hx   . Malignant hyperthermia Neg Hx   . Pseudochol deficiency Neg Hx     History  Substance Use Topics  . Smoking status: Former Smoker -- 0.50 packs/day for 30 years    Types: Cigarettes    Quit date: 03/25/2003  . Smokeless tobacco: Not on file  . Alcohol Use: No    OB History   Grav Para Term Preterm Abortions TAB SAB Ect Mult Living                  Review of Systems  Constitutional: Negative for fever.  Gastrointestinal: Positive for abdominal pain. Negative for vomiting.  Neurological: Negative for loss of consciousness.  All other systems reviewed and are negative.    Allergies  Review of patient's allergies indicates no known allergies.  Home Medications   Current Outpatient Rx  Name  Route  Sig  Dispense  Refill  . amLODipine (NORVASC) 5 MG tablet  Oral   Take 5 mg by mouth every morning.          Marland Kitchen atorvastatin (LIPITOR) 10 MG tablet   Oral   Take 10 mg by mouth at bedtime.          . ferrous fumarate (HEMOCYTE - 106 MG FE) 325 (106 FE) MG TABS   Oral   Take 1 tablet by mouth.         . fluticasone (FLONASE) 50 MCG/ACT nasal spray   Nasal   Place 2 sprays into the nose daily. allergies          . acetaminophen (TYLENOL) 325 MG tablet   Oral   Take 650 mg by mouth every 4 (four) hours as needed. For headaches, fever or pain         . acetaminophen (TYLENOL) 500 MG tablet   Oral   Take 500 mg by mouth 2 (two)  times daily.         Marland Kitchen aluminum & magnesium hydroxide (MAALOX) 225-200 MG/5ML suspension   Oral   Take 30 mLs by mouth every 4 (four) hours as needed. For indigestion         . Calcium Carb-Cholecalciferol (CALCIUM 500 +D) 500-400 MG-UNIT TABS   Oral   Take 1 tablet by mouth 2 (two) times daily.           Marland Kitchen escitalopram (LEXAPRO) 20 MG tablet   Oral   Take 20 mg by mouth every morning.          Marland Kitchen HYDROcodone-acetaminophen (NORCO) 5-325 MG per tablet   Oral   Take 1 tablet by mouth every 4 (four) hours as needed. pain          . hydrocortisone (ANUSOL-HC) 2.5 % rectal cream   Rectal   Place 1 application rectally daily as needed. For irritation and hemorrhoids         . loperamide (IMODIUM) 2 MG capsule   Oral   Take 4 mg by mouth daily as needed. Takes 2 capsules after 1st loose stool. Then take 1 tab after each loose stool. Do not exceed 16 grams/24 hour period         . omeprazole (PRILOSEC) 20 MG capsule   Oral   Take 20 mg by mouth every morning.           Marland Kitchen oxybutynin (DITROPAN-XL) 10 MG 24 hr tablet   Oral   Take 10 mg by mouth every morning.          . potassium citrate (UROCIT-K) 10 MEQ (1080 MG) SR tablet   Oral   Take 15 mEq by mouth 2 (two) times daily.          Marland Kitchen senna (SENOKOT) 8.6 MG TABS   Oral   Take 2 tablets by mouth every morning.          . vitamin B-12 (CYANOCOBALAMIN) 1000 MCG tablet   Oral   Take 1,000 mcg by mouth every morning.            BP 152/57  Pulse 64  Temp(Src) 98.1 F (36.7 C) (Oral)  Resp 20  SpO2 98%  Physical Exam  Nursing note and vitals reviewed. Constitutional: She is oriented to person, place, and time. She appears well-developed and well-nourished. No distress.  HENT:  Head: Normocephalic and atraumatic.    Eyes: EOM are normal. Pupils are equal, round, and reactive to light.  Neck: Normal range of motion. Neck supple.  Cardiovascular:  Normal rate and regular rhythm.  Exam reveals no  friction rub.   No murmur heard. Pulmonary/Chest: Effort normal and breath sounds normal. No respiratory distress. She has no wheezes. She has no rales.  Abdominal: Soft. She exhibits distension (mild, lower abdomen). There is tenderness (lower abdomen). There is no rebound.  Musculoskeletal: Normal range of motion. She exhibits no edema.  Neurological: She is alert and oriented to person, place, and time.  Skin: She is not diaphoretic.    ED Course  Procedures (including critical care time)  Labs Reviewed - No data to display No results found.   1. Fall, initial encounter   2. Scalp laceration, initial encounter   3. UTI (urinary tract infection)      Date: 07/22/2012  Rate: 44  Rhythm: sinus bradycardia  QRS Axis: normal  Intervals: PR prolonged  ST/T Wave abnormalities: normal  Conduction Disutrbances:first-degree A-V block  and second-degree A-V block, ( Mobitz I )  Narrative Interpretation:   Old EKG Reviewed: changes noted and no Mobitz 1 on previous, however in chart review, noted to have Wenkebach block prior to this    MDM   79 year old female presents s/p being found in the floor at her nursing home. She cannot remember what happened. A nursing aide from her nursing home states that she routinely gets up and does not use her walker. She is not on any blood thinners. She has dementia, no hx of CAD. Here her vitals are stable. She alert, talkative, and cannot tell me anything about her fall. She has a small laceration on the back of her head. No neck pain. Suprapubic pain noted on her exam with mild lower abdominal distention. No other gross bony abnormality.  Aide from the nursing home states that she has frequent UTIs and is frequently overdramatic. I spoke with Nurse at the nursing home who got in report that she was found to have a 6 mm lower ureteral stone recently (date unknown, likely today or yesterday) and the recommendations stated, "No further treatment at this  time." Will scan head, neck, xray chest/pelvis.  Labs show negative troponin, normal lactate. Chronic anemia, at its baseline. Kidney disease, at its baseline. No leukocytosis.  Awaiting further labs and all imaging. Care to Dr. Colon Branch.        Elwin Mocha, MD 08/18/12 2241

## 2012-07-22 LAB — URINE MICROSCOPIC-ADD ON

## 2012-07-22 LAB — URINALYSIS, ROUTINE W REFLEX MICROSCOPIC
Bilirubin Urine: NEGATIVE
Glucose, UA: NEGATIVE mg/dL
Hgb urine dipstick: NEGATIVE
Ketones, ur: NEGATIVE mg/dL
Protein, ur: NEGATIVE mg/dL
Urobilinogen, UA: 0.2 mg/dL (ref 0.0–1.0)

## 2012-07-22 LAB — LACTIC ACID, PLASMA: Lactic Acid, Venous: 1.1 mmol/L (ref 0.5–2.2)

## 2012-07-22 LAB — BASIC METABOLIC PANEL
BUN: 34 mg/dL — ABNORMAL HIGH (ref 6–23)
CO2: 25 mEq/L (ref 19–32)
Calcium: 9.7 mg/dL (ref 8.4–10.5)
Creatinine, Ser: 1.76 mg/dL — ABNORMAL HIGH (ref 0.50–1.10)
GFR calc non Af Amer: 26 mL/min — ABNORMAL LOW (ref >90)
Glucose, Bld: 106 mg/dL — ABNORMAL HIGH (ref 70–99)

## 2012-07-22 MED ORDER — NITROFURANTOIN MACROCRYSTAL 100 MG PO CAPS
100.0000 mg | ORAL_CAPSULE | Freq: Once | ORAL | Status: AC
  Administered 2012-07-22: 100 mg via ORAL
  Filled 2012-07-22: qty 1

## 2012-07-22 MED ORDER — NITROFURANTOIN MACROCRYSTAL 100 MG PO CAPS: 100.0000 mg | ORAL_CAPSULE | Freq: Two times a day (BID) | ORAL | Status: DC

## 2012-07-22 NOTE — Progress Notes (Signed)
I saw and evaluated the patient, reviewed the resident's note and I agree with the findings and plan.       Pfeiffer, Marcy, MD 03/10/22 1140  

## 2012-07-22 NOTE — ED Provider Notes (Signed)
0120 Assumed care/disposition of patient. Patient was found on the floor next to her bed in the nursing home. She had hit her head on something and there was blood at the scene. CT head and cervical spine without acute abnormality. Laceration to back of head has sealed itself and does not require repair. UA with infection. Macrodantin given. Patient ready for discharge home. Reviewed findings with patient's son.   Pt feels improved after observation and/or treatment in ED.Pt stable in ED with no significant deterioration in condition.The patient appears reasonably screened and/or stabilized for discharge and I doubt any other medical condition or other [facility] requiring further screening, evaluation, or treatment in the ED at this time prior to discharge.  Nicoletta Dress. Colon Branch, MD 07/22/12 1610

## 2012-07-23 ENCOUNTER — Telehealth (HOSPITAL_COMMUNITY): Payer: Self-pay | Admitting: Emergency Medicine

## 2012-07-23 LAB — URINE CULTURE

## 2012-07-23 NOTE — ED Notes (Signed)
[facility] lab called positive urnc- confirmed esbl. Treated with macrobid- intermediate acting- chart sent to edp for review

## 2012-07-24 ENCOUNTER — Telehealth (HOSPITAL_COMMUNITY): Payer: Self-pay | Admitting: Emergency Medicine

## 2012-07-24 NOTE — ED Notes (Signed)
Chart returned from EDP office. Per Roxy Horseman PA-C, if patient is still symptomatic, stop Macrobid and start Keflex 500 mg PO 4 x daily for 10 days.

## 2012-07-24 NOTE — ED Notes (Deleted)
Patient has +Urine culture. Checking to see if appropriately treated. °

## 2012-07-26 ENCOUNTER — Telehealth (HOSPITAL_COMMUNITY): Payer: Self-pay | Admitting: Emergency Medicine

## 2012-08-21 NOTE — ED Provider Notes (Addendum)
I personally performed the services described in this documentation.  The recorded information has been reviewed and is accurate. I saw and evaluated the patient, reviewed the resident's note and I agree with the findings and plan.    Nicoletta Dress. Colon Branch, MD 08/21/12 0142  Nicoletta Dress. Colon Branch, MD 08/26/12 2350

## 2012-12-18 ENCOUNTER — Other Ambulatory Visit: Payer: Self-pay | Admitting: Urology

## 2012-12-18 DIAGNOSIS — N200 Calculus of kidney: Secondary | ICD-10-CM

## 2012-12-30 ENCOUNTER — Ambulatory Visit (HOSPITAL_COMMUNITY)
Admission: RE | Admit: 2012-12-30 | Discharge: 2012-12-30 | Disposition: A | Discharge status: Home / Self Care | Payer: PRIVATE HEALTH INSURANCE | Source: Ambulatory Visit | Attending: Urology | Admitting: Urology

## 2012-12-30 ENCOUNTER — Other Ambulatory Visit: Payer: Self-pay | Admitting: Urology

## 2012-12-30 DIAGNOSIS — N200 Calculus of kidney: Secondary | ICD-10-CM

## 2012-12-30 DIAGNOSIS — M479 Spondylosis, unspecified: Secondary | ICD-10-CM | POA: Insufficient documentation

## 2012-12-30 DIAGNOSIS — N189 Chronic kidney disease, unspecified: Secondary | ICD-10-CM | POA: Insufficient documentation

## 2012-12-30 DIAGNOSIS — Z87442 Personal history of urinary calculi: Secondary | ICD-10-CM | POA: Insufficient documentation

## 2013-01-08 ENCOUNTER — Ambulatory Visit (INDEPENDENT_AMBULATORY_CARE_PROVIDER_SITE_OTHER): Payer: PRIVATE HEALTH INSURANCE | Admitting: Urology

## 2013-01-08 DIAGNOSIS — N200 Calculus of kidney: Secondary | ICD-10-CM

## 2013-01-08 DIAGNOSIS — Z87440 Personal history of urinary (tract) infections: Secondary | ICD-10-CM

## 2013-02-24 ENCOUNTER — Encounter: Payer: Self-pay | Admitting: Internal Medicine

## 2013-02-26 ENCOUNTER — Other Ambulatory Visit: Payer: Self-pay | Admitting: Gastroenterology

## 2013-02-26 ENCOUNTER — Encounter: Payer: Self-pay | Admitting: Gastroenterology

## 2013-02-26 ENCOUNTER — Ambulatory Visit (INDEPENDENT_AMBULATORY_CARE_PROVIDER_SITE_OTHER): Payer: PRIVATE HEALTH INSURANCE | Admitting: Gastroenterology

## 2013-02-26 VITALS — BP 113/58 | HR 57 | Temp 98.2°F | Ht 62.4 in

## 2013-02-26 DIAGNOSIS — R1084 Generalized abdominal pain: Secondary | ICD-10-CM

## 2013-02-26 DIAGNOSIS — G8929 Other chronic pain: Secondary | ICD-10-CM

## 2013-02-26 NOTE — Patient Instructions (Addendum)
1. Please see orders for CBC, CMET, lipase, CT A/P. 2. Please call with last six months of weights. 3. Please have patient's nurse call and give [place] more information as to what has been going on with the patient, ie abdominal pain, bowel habits, eating habits, any vomiting.

## 2013-02-26 NOTE — Assessment & Plan Note (Signed)
79 year old lady who presents for abdominal pain from the nursing home. We will make contact with the patient's nurse to determine what specific symptoms or concerns they have regarding the patient as patient is unable to provide any details. She did have diffuse tenderness on exam today. We will schedule her for a CT of the abdomen and pelvis with contrast. She does have some renal insufficiency so we have requested that the she received dose adjusted IV contrast per protocol. Further recommendations to follow.

## 2013-02-26 NOTE — Progress Notes (Signed)
Primary Care Physician:  Avon Gully, MD  Primary Gastroenterologist:  Roetta Sessions, MD   Chief Complaint  Patient presents with  . Abdominal Pain    HPI:  [name] [name] is a 79 y.o. female, resident of [facility], here for further evaluation of abdominal pain. Patient presented with a staff member from [facility], but they are unaware of why she is here today. There are no records to indicate what type of problem she has been having other than abdominal pain. Patient unable to say why she is here. When prompted she says she has heartburn and epigastric pain. History very unreliable in the setting of dementia. She denies N/V. BM "OK" No melena, brbpr. Doesn't know date, president, her own birthday.   Current Outpatient Prescriptions  Medication Sig Dispense Refill  . acetaminophen (TYLENOL) 325 MG tablet Take 650 mg by mouth every 4 (four) hours as needed. For headaches, fever or pain      . acetaminophen (TYLENOL) 500 MG tablet Take 500 mg by mouth 2 (two) times daily.      Marland Kitchen ALPRAZolam (XANAX) 0.25 MG tablet Take 0.25 mg by mouth at bedtime as needed for sleep.      Marland Kitchen aluminum & magnesium hydroxide (MAALOX) 225-200 MG/5ML suspension Take 30 mLs by mouth every 4 (four) hours as needed. For indigestion      . amLODipine (NORVASC) 5 MG tablet Take 5 mg by mouth every morning.       Marland Kitchen atorvastatin (LIPITOR) 10 MG tablet Take 10 mg by mouth at bedtime.       . Calcium Carb-Cholecalciferol (CALCIUM 500 +D) 500-400 MG-UNIT TABS Take 1 tablet by mouth 2 (two) times daily.        Marland Kitchen escitalopram (LEXAPRO) 20 MG tablet Take 15 mg by mouth every morning.       . famotidine (PEPCID) 20 MG tablet Take 20 mg by mouth 2 (two) times daily.      . ferrous fumarate (HEMOCYTE - 106 MG FE) 325 (106 FE) MG TABS Take 1 tablet by mouth.      . fluticasone (FLONASE) 50 MCG/ACT nasal spray Place 2 sprays into the nose daily. allergies       . HYDROcodone-acetaminophen (NORCO) 5-325 MG per tablet Take 1 tablet by mouth  every 4 (four) hours as needed. pain       . hydrocortisone (ANUSOL-HC) 2.5 % rectal cream Place 1 application rectally daily as needed. For irritation and hemorrhoids      . loperamide (IMODIUM) 2 MG capsule Take 4 mg by mouth daily as needed. Takes 2 capsules after 1st loose stool. Then take 1 tab after each loose stool. Do not exceed 16 grams/24 hour period      . nitrofurantoin (MACRODANTIN) 100 MG capsule Take 1 capsule (100 mg total) by mouth 2 (two) times daily.  10 capsule  0  . omeprazole (PRILOSEC) 20 MG capsule Take 20 mg by mouth every morning.        Marland Kitchen oxybutynin (DITROPAN-XL) 10 MG 24 hr tablet Take 10 mg by mouth every morning.       . potassium chloride 20 MEQ/15ML (10%) solution Take 20 mEq by mouth daily.      Marland Kitchen senna (SENOKOT) 8.6 MG TABS Take 3 tablets by mouth every morning.       . vitamin B-12 (CYANOCOBALAMIN) 1000 MCG tablet Take 1,000 mcg by mouth every morning.        No current facility-administered medications for this visit.  Allergies as of 02/26/2013  . (No Known Allergies)    Past Medical History  Diagnosis Date  . Chronic kidney disease     acute renal failure  . COPD (chronic obstructive pulmonary disease)   . Diabetes mellitus   . Depression   . DEMENTIA   . Hypertension   . Shortness of breath   . GERD (gastroesophageal reflux disease)   . Gallstones   . Diverticulosis     Past Surgical History  Procedure Laterality Date  . Abdominal hysterectomy    . Cystoscopy  2008    with stent placement  . Extracorporeal shock wave lithotripsy  03/13/2011    Procedure: EXTRACORPOREAL SHOCK WAVE LITHOTRIPSY (ESWL);  Surgeon: Ky Barban;  Location: [facility];  Service: Urology;  Laterality: Right;  Right Renal Calculus  . Cystoscopy with litholapaxy  03/28/2011    Procedure: CYSTOSCOPY WITH LITHOLAPAXY;  Surgeon: Ky Barban;  Location: [facility];  Service: Urology;  Laterality: Right;  . Cystoscopy w/ ureteral stent placement  03/28/2011     Procedure: CYSTOSCOPY WITH RETROGRADE PYELOGRAM/URETERAL STENT PLACEMENT;  Surgeon: Ky Barban;  Location: [facility];  Service: Urology;  Laterality: Right;  . Extracorporeal shock wave lithotripsy  09/25/2011    Procedure: EXTRACORPOREAL SHOCK WAVE LITHOTRIPSY (ESWL);  Surgeon: Ky Barban, MD;  Location: [facility];  Service: Urology;  Laterality: Right;  ESWL Right Renal Calculus  . Esophagogastroduodenoscopy  03/19/2005    WUJ:[id number] antral gastritis, need to rule out H pylori infection/Pyloric channel ulcer without stigmata of bleed/Bulbar ulcer with duodenitis without stigmata of bleed    Family History  Problem Relation Age of Onset  . Anesthesia problems Neg Hx   . Hypotension Neg Hx   . Malignant hyperthermia Neg Hx   . Pseudochol deficiency Neg Hx     History   Social History  . Marital Status: Widowed    Spouse Name: N/A    Number of Children: N/A  . Years of Education: N/A   Occupational History  . Not on file.   Social History Main Topics  . Smoking status: Former Smoker -- 0.50 packs/day for 30 years    Types: Cigarettes    Quit date: 03/25/2003  . Smokeless tobacco: Not on file  . Alcohol Use: No  . Drug Use: No  . Sexual Activity: No   Other Topics Concern  . Not on file   Social History Narrative  . No narrative on file      ROS: Unreliable, see history of present illness.  General: Negative for anorexia, weight loss, fever, chills, fatigue, weakness. Eyes: Negative for vision changes.  ENT: Negative for hoarseness, difficulty swallowing , nasal congestion. CV: Negative for chest pain, angina, palpitations, dyspnea on exertion, peripheral edema.  Respiratory: Negative for dyspnea at rest, dyspnea on exertion, cough, sputum, wheezing.  GI: See history of present illness. GU:  Negative for dysuria, hematuria, urinary incontinence, urinary frequency, nocturnal urination.  MS: Complains of back pain  Derm: Negative for rash or itching.  Neuro:  Negative for weakness, abnormal sensation, seizure, frequent headaches, memory loss, confusion.  Psych: Negative for anxiety, depression, suicidal ideation, hallucinations.  Endo: Negative for unusual weight change.  Heme: Negative for bruising or bleeding. Allergy: Negative for rash or hives.    Physical Examination:  BP 113/58  Pulse 57  Temp(Src) 98.2 F (36.8 C) (Oral)  Ht 5' 2.4" (1.585 m)   General: Well-nourished, well-developed in no acute distress. Ambulates slowly Head: Normocephalic, atraumatic.  Eyes: Conjunctiva pink, no icterus. Mouth: Oropharyngeal mucosa moist and pink , no lesions erythema or exudate. Neck: Supple without thyromegaly, masses, or lymphadenopathy.  Lungs: Clear to auscultation bilaterally.  Heart: Regular rate and rhythm, no murmurs rubs or gallops.  Abdomen: Bowel sounds are normal, diffuse abdominal tenderness, patient moans with palpation, nondistended, no hepatosplenomegaly or masses, no abdominal bruits or    hernia , no rebound or guarding.   Rectal: not performed Extremities: No lower extremity edema. No clubbing or deformities.  Neuro: Alert and oriented to person only, grossly normal neurologically.  Skin: Warm and dry, no rash or jaundice.   Psych: Alert and cooperative, normal mood and affect.

## 2013-03-01 NOTE — Progress Notes (Signed)
cc'd to pcp 

## 2013-03-03 ENCOUNTER — Ambulatory Visit (HOSPITAL_COMMUNITY)
Admission: RE | Admit: 2013-03-03 | Discharge: 2013-03-03 | Disposition: A | Discharge status: Home / Self Care | Payer: PRIVATE HEALTH INSURANCE | Source: Ambulatory Visit | Attending: Gastroenterology | Admitting: Gastroenterology

## 2013-03-03 ENCOUNTER — Other Ambulatory Visit: Payer: Self-pay | Admitting: Gastroenterology

## 2013-03-03 ENCOUNTER — Encounter (HOSPITAL_COMMUNITY): Payer: Self-pay

## 2013-03-03 DIAGNOSIS — K5730 Diverticulosis of large intestine without perforation or abscess without bleeding: Secondary | ICD-10-CM | POA: Insufficient documentation

## 2013-03-03 DIAGNOSIS — G8929 Other chronic pain: Secondary | ICD-10-CM

## 2013-03-03 DIAGNOSIS — I700 Atherosclerosis of aorta: Secondary | ICD-10-CM | POA: Insufficient documentation

## 2013-03-03 DIAGNOSIS — K8020 Calculus of gallbladder without cholecystitis without obstruction: Secondary | ICD-10-CM | POA: Insufficient documentation

## 2013-03-03 DIAGNOSIS — N200 Calculus of kidney: Secondary | ICD-10-CM | POA: Insufficient documentation

## 2013-03-03 NOTE — Progress Notes (Signed)
Quick Note:  Noncontrast study due to renal insufficiency. Single gallstone seen. Nonobstructive right kidney stone.  Please get copy of labs I ordered at [facility].  Please find out from nursing staff what has been going on with the patient. ie problems with appetite, bowels, vomiting, abdominal pain, reason for visit with [place]???? ______

## 2013-03-04 NOTE — Progress Notes (Signed)
Quick Note:  CT ordered this. Please send copy to patient's PCP for management.    ______

## 2013-03-05 NOTE — Progress Notes (Signed)
Quick Note:  Labs from 03/01/2013. White blood cell count 9300, hemoglobin 12, hematocrit 36.9, MCV 91.4, platelets 241,000, glucose 116, BUN 44.8, creatinine 2.3? Poor copy, sodium 141, potassium 5.6, total bilirubin 0.6, albumin 3.9, ALT 9, AST 15, alkaline phosphatase 60, lipase 44.  Please get me copy of last six months of weights on this patient from [facility]. May need EGD. ______

## 2013-03-08 LAB — COMPREHENSIVE METABOLIC PANEL
AST: 15 U/L
Glucose: 116
Potassium: 5.6 mmol/L
Sodium: 141 mmol/L (ref 137–147)

## 2013-03-08 LAB — CBC
HGB: 12 g/dL
WBC: 9.3

## 2013-03-23 NOTE — Progress Notes (Signed)
Quick Note:  Looked at [employer]. 10+ lb weight loss over past one year. Given report of diminished oral intake, abd pain, CT findings I would offer EGD if we can get her consented.  Please schedule EGD with Dr. Jena Gauss. ______

## 2013-03-27 ENCOUNTER — Encounter (HOSPITAL_COMMUNITY): Payer: Self-pay | Admitting: Emergency Medicine

## 2013-03-27 ENCOUNTER — Inpatient Hospital Stay (HOSPITAL_COMMUNITY)
Admission: EM | Admit: 2013-03-27 | Discharge: 2013-03-31 | DRG: 690 | Disposition: A | Discharge status: Skilled Nursing Facility | Payer: PRIVATE HEALTH INSURANCE | Attending: Internal Medicine | Admitting: Internal Medicine

## 2013-03-27 DIAGNOSIS — F603 Borderline personality disorder: Secondary | ICD-10-CM

## 2013-03-27 DIAGNOSIS — E119 Type 2 diabetes mellitus without complications: Secondary | ICD-10-CM | POA: Diagnosis present

## 2013-03-27 DIAGNOSIS — Z79899 Other long term (current) drug therapy: Secondary | ICD-10-CM

## 2013-03-27 DIAGNOSIS — J4489 Other specified chronic obstructive pulmonary disease: Secondary | ICD-10-CM | POA: Diagnosis present

## 2013-03-27 DIAGNOSIS — R451 Restlessness and agitation: Secondary | ICD-10-CM

## 2013-03-27 DIAGNOSIS — N390 Urinary tract infection, site not specified: Principal | ICD-10-CM | POA: Diagnosis present

## 2013-03-27 DIAGNOSIS — F329 Major depressive disorder, single episode, unspecified: Secondary | ICD-10-CM | POA: Diagnosis present

## 2013-03-27 DIAGNOSIS — I129 Hypertensive chronic kidney disease with stage 1 through stage 4 chronic kidney disease, or unspecified chronic kidney disease: Secondary | ICD-10-CM | POA: Diagnosis present

## 2013-03-27 DIAGNOSIS — N189 Chronic kidney disease, unspecified: Secondary | ICD-10-CM | POA: Diagnosis present

## 2013-03-27 DIAGNOSIS — F03918 Unspecified dementia, unspecified severity, with other behavioral disturbance: Secondary | ICD-10-CM | POA: Diagnosis present

## 2013-03-27 DIAGNOSIS — Z87891 Personal history of nicotine dependence: Secondary | ICD-10-CM

## 2013-03-27 DIAGNOSIS — K219 Gastro-esophageal reflux disease without esophagitis: Secondary | ICD-10-CM | POA: Diagnosis present

## 2013-03-27 DIAGNOSIS — F3289 Other specified depressive episodes: Secondary | ICD-10-CM | POA: Diagnosis present

## 2013-03-27 DIAGNOSIS — R4689 Other symptoms and signs involving appearance and behavior: Secondary | ICD-10-CM

## 2013-03-27 DIAGNOSIS — E860 Dehydration: Secondary | ICD-10-CM | POA: Diagnosis present

## 2013-03-27 DIAGNOSIS — F0391 Unspecified dementia with behavioral disturbance: Secondary | ICD-10-CM | POA: Diagnosis present

## 2013-03-27 DIAGNOSIS — J449 Chronic obstructive pulmonary disease, unspecified: Secondary | ICD-10-CM | POA: Diagnosis present

## 2013-03-27 HISTORY — DX: Person with feared health complaint in whom no diagnosis is made: Z71.1

## 2013-03-27 LAB — COMPREHENSIVE METABOLIC PANEL
Albumin: 3.6 g/dL (ref 3.5–5.2)
BUN: 43 mg/dL — ABNORMAL HIGH (ref 6–23)
Calcium: 9.6 mg/dL (ref 8.4–10.5)
Creatinine, Ser: 2.09 mg/dL — ABNORMAL HIGH (ref 0.50–1.10)
GFR calc Af Amer: 24 mL/min — ABNORMAL LOW (ref >90)
Glucose, Bld: 143 mg/dL — ABNORMAL HIGH (ref 70–99)
Potassium: 4.9 mEq/L (ref 3.5–5.1)
Total Protein: 7.5 g/dL (ref 6.0–8.3)

## 2013-03-27 LAB — URINALYSIS, ROUTINE W REFLEX MICROSCOPIC
Nitrite: NEGATIVE
Specific Gravity, Urine: 1.02 (ref 1.005–1.030)
pH: 6 (ref 5.0–8.0)

## 2013-03-27 LAB — CBC WITH DIFFERENTIAL/PLATELET
Basophils Absolute: 0 10*3/uL (ref 0.0–0.1)
Basophils Relative: 1 % (ref 0–1)
Eosinophils Absolute: 0.1 10*3/uL (ref 0.0–0.7)
Eosinophils Relative: 2 % (ref 0–5)
Lymphs Abs: 1.6 10*3/uL (ref 0.7–4.0)
MCH: 29.7 pg (ref 26.0–34.0)
MCHC: 32.9 g/dL (ref 30.0–36.0)
MCV: 90.3 fL (ref 78.0–100.0)
Monocytes Absolute: 0.6 10*3/uL (ref 0.1–1.0)
Monocytes Relative: 8 % (ref 3–12)
Neutrophils Relative %: 68 % (ref 43–77)
Platelets: 258 10*3/uL (ref 150–400)
RBC: 3.91 MIL/uL (ref 3.87–5.11)

## 2013-03-27 LAB — URINE MICROSCOPIC-ADD ON

## 2013-03-27 LAB — RAPID URINE DRUG SCREEN, HOSP PERFORMED
Barbiturates: NOT DETECTED
Cocaine: NOT DETECTED
Opiates: POSITIVE — AB

## 2013-03-27 MED ORDER — SODIUM CHLORIDE 0.9 % IV SOLN
INTRAVENOUS | Status: DC
  Administered 2013-03-27: 21:00:00 via INTRAVENOUS

## 2013-03-27 MED ORDER — HALOPERIDOL LACTATE 5 MG/ML IJ SOLN
2.0000 mg | Freq: Once | INTRAMUSCULAR | Status: AC
  Administered 2013-03-27: 2 mg via INTRAVENOUS
  Filled 2013-03-27: qty 1

## 2013-03-27 MED ORDER — DEXTROSE 5 % IV SOLN
1.0000 g | Freq: Once | INTRAVENOUS | Status: AC
  Administered 2013-03-27: 1 g via INTRAVENOUS
  Filled 2013-03-27: qty 10

## 2013-03-27 MED ORDER — HALOPERIDOL LACTATE 5 MG/ML IJ SOLN
2.0000 mg | Freq: Once | INTRAMUSCULAR | Status: AC
  Administered 2013-03-27: 2 mg via INTRAMUSCULAR
  Filled 2013-03-27: qty 1
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
## 2013-03-27 NOTE — ED Notes (Signed)
Pt resting in bed, easily aroused, much more calm and cooperative.

## 2013-03-28 DIAGNOSIS — F0391 Unspecified dementia with behavioral disturbance: Secondary | ICD-10-CM | POA: Diagnosis present

## 2013-03-28 DIAGNOSIS — N390 Urinary tract infection, site not specified: Secondary | ICD-10-CM | POA: Diagnosis present

## 2013-03-28 DIAGNOSIS — F03918 Unspecified dementia, unspecified severity, with other behavioral disturbance: Secondary | ICD-10-CM | POA: Diagnosis present

## 2013-03-28 DIAGNOSIS — R4689 Other symptoms and signs involving appearance and behavior: Secondary | ICD-10-CM

## 2013-03-28 DIAGNOSIS — R451 Restlessness and agitation: Secondary | ICD-10-CM

## 2013-03-28 LAB — BASIC METABOLIC PANEL WITH GFR
BUN: 33 mg/dL — ABNORMAL HIGH (ref 6–23)
CO2: 25 meq/L (ref 19–32)
Calcium: 9.5 mg/dL (ref 8.4–10.5)
Chloride: 105 meq/L (ref 96–112)
Creatinine, Ser: 1.7 mg/dL — ABNORMAL HIGH (ref 0.50–1.10)
GFR calc Af Amer: 31 mL/min — ABNORMAL LOW
GFR calc non Af Amer: 27 mL/min — ABNORMAL LOW
Glucose, Bld: 117 mg/dL — ABNORMAL HIGH (ref 70–99)
Potassium: 4.7 meq/L (ref 3.5–5.1)
Sodium: 139 meq/L (ref 135–145)

## 2013-03-28 LAB — CBC
HCT: 35.4 % — ABNORMAL LOW (ref 36.0–46.0)
Hemoglobin: 11.7 g/dL — ABNORMAL LOW (ref 12.0–15.0)
MCH: 29.8 pg (ref 26.0–34.0)
MCHC: 33.1 g/dL (ref 30.0–36.0)
MCV: 90.3 fL (ref 78.0–100.0)
Platelets: 254 K/uL (ref 150–400)
RBC: 3.92 MIL/uL (ref 3.87–5.11)
RDW: 13.6 % (ref 11.5–15.5)
WBC: 6.7 K/uL (ref 4.0–10.5)

## 2013-03-28 LAB — OCCULT BLOOD X 1 CARD TO LAB, STOOL: Fecal Occult Bld: NEGATIVE

## 2013-03-28 MED ORDER — ENOXAPARIN SODIUM 30 MG/0.3ML ~~LOC~~ SOLN
30.0000 mg | SUBCUTANEOUS | Status: DC
  Administered 2013-03-29 – 2013-03-30 (×2): 30 mg via SUBCUTANEOUS
  Filled 2013-03-28 (×2): qty 0.3

## 2013-03-28 MED ORDER — ATORVASTATIN CALCIUM 10 MG PO TABS
10.0000 mg | ORAL_TABLET | Freq: Every day | ORAL | Status: DC
  Administered 2013-03-28 – 2013-03-30 (×3): 10 mg via ORAL
  Filled 2013-03-28 (×3): qty 1

## 2013-03-28 MED ORDER — SODIUM CHLORIDE 0.9 % IV SOLN: INTRAVENOUS | Status: DC

## 2013-03-28 MED ORDER — CALCIUM CARBONATE-VITAMIN D 500-200 MG-UNIT PO TABS
1.0000 | ORAL_TABLET | Freq: Two times a day (BID) | ORAL | Status: DC
  Administered 2013-03-28 – 2013-03-30 (×4): 1 via ORAL
  Filled 2013-03-28 (×4): qty 1

## 2013-03-28 MED ORDER — DOCUSATE SODIUM 100 MG PO CAPS
100.0000 mg | ORAL_CAPSULE | Freq: Two times a day (BID) | ORAL | Status: DC
  Administered 2013-03-28 – 2013-03-30 (×4): 100 mg via ORAL
  Filled 2013-03-28 (×4): qty 1

## 2013-03-28 MED ORDER — FAMOTIDINE 20 MG PO TABS
20.0000 mg | ORAL_TABLET | Freq: Two times a day (BID) | ORAL | Status: DC
  Administered 2013-03-28 – 2013-03-30 (×4): 20 mg via ORAL
  Filled 2013-03-28 (×4): qty 1

## 2013-03-28 MED ORDER — HALOPERIDOL LACTATE 5 MG/ML IJ SOLN
2.0000 mg | Freq: Four times a day (QID) | INTRAMUSCULAR | Status: DC | PRN
  Administered 2013-03-30: 2 mg via INTRAVENOUS
  Filled 2013-03-28: qty 1

## 2013-03-28 MED ORDER — ONDANSETRON HCL 4 MG/2ML IJ SOLN: 4.0000 mg | Freq: Four times a day (QID) | INTRAMUSCULAR | Status: DC | PRN

## 2013-03-28 MED ORDER — ACETAMINOPHEN 500 MG PO TABS
500.0000 mg | ORAL_TABLET | Freq: Two times a day (BID) | ORAL | Status: DC
  Administered 2013-03-28 – 2013-03-30 (×4): 500 mg via ORAL
  Filled 2013-03-28 (×4): qty 1

## 2013-03-28 MED ORDER — FLEET ENEMA 7-19 GM/118ML RE ENEM: 1.0000 | ENEMA | Freq: Every day | RECTAL | Status: DC | PRN

## 2013-03-28 MED ORDER — HALOPERIDOL LACTATE 5 MG/ML IJ SOLN
5.0000 mg | Freq: Four times a day (QID) | INTRAMUSCULAR | Status: DC | PRN
  Administered 2013-03-28 – 2013-03-30 (×6): 5 mg via INTRAVENOUS
  Filled 2013-03-28 (×7): qty 1

## 2013-03-28 MED ORDER — LORAZEPAM 2 MG/ML IJ SOLN
0.5000 mg | Freq: Four times a day (QID) | INTRAMUSCULAR | Status: DC | PRN
  Administered 2013-03-28 – 2013-03-29 (×2): 0.5 mg via INTRAVENOUS
  Filled 2013-03-28 (×3): qty 1

## 2013-03-28 MED ORDER — FERROUS FUMARATE 325 (106 FE) MG PO TABS
1.0000 | ORAL_TABLET | Freq: Two times a day (BID) | ORAL | Status: DC
  Administered 2013-03-28 – 2013-03-30 (×4): 106 mg via ORAL
  Filled 2013-03-28 (×5): qty 1

## 2013-03-28 MED ORDER — AMLODIPINE BESYLATE 5 MG PO TABS
5.0000 mg | ORAL_TABLET | Freq: Every morning | ORAL | Status: DC
  Administered 2013-03-30: 5 mg via ORAL
  Filled 2013-03-28 (×2): qty 1

## 2013-03-28 NOTE — Progress Notes (Signed)
Subjective: Patient was admitted last night due to change in mental status. She is agitated and aggressive.  Objective: Vital signs in last 24 hours: Temp:  [97.7 F (36.5 C)-98.3 F (36.8 C)] 97.7 F (36.5 C) (11/01 2336) Pulse Rate:  [66-68] 68 (11/01 2336) Resp:  [16] 16 (11/01 2336) BP: (134-144)/(48-74) 144/74 mmHg (11/01 2336) SpO2:  [93 %] 93 % (11/01 1948) Weight:  [64.9 kg (143 lb 1.3 oz)-73.256 kg (161 lb 8 oz)] 64.9 kg (143 lb 1.3 oz) (11/01 2336) Weight change:     Intake/Output from previous day:    PHYSICAL EXAM General appearance: alert and combative Resp: clear to auscultation bilaterally Cardio: S1, S2 normal GI: soft, non-tender; bowel sounds normal; no masses,  no organomegaly Extremities: extremities normal, atraumatic, no cyanosis or edema  Lab Results:    @labtest @ [facility] No results found for this basename: PHART, PCO2, PO2ART, TCO2, HCO3,  in the last 72 hours CULTURES No results found for this or any previous visit (from the past 240 hour(s)). Studies/Results: No results found.  Medications: I have reviewed the patient's current medications.  Assesment: Active Problems:   UTI (urinary tract infection)   Agitation   Aggressive behavior   Dementia with behavioral disturbance    Plan: Medications reviewed Continue IV Rocephin Continue PRN haldol Will start ativan .5 mg q 6hrs PRN     LOS: 1 day   Christpher Stogsdill 03/28/2013, 1:43 PM

## 2013-03-28 NOTE — H&P (Signed)
[facility] History and Physical  [name] [name]  ZOX:[id number]  DOB: 12-Sep-1928   DOA: 03/27/2013   PCP:   Avon Gully, MD   Chief Complaint:  Acute aggressive behavior  HPI: [name] [name] is a 79 y.o. female.   Caucasian resident of [facility] skilled nursing facility; history of dementia, but is usually very pleasant and nonaggressive and easy to manage, is sent to the emergency room because of a sudden change in behavior being aggressive and violent towards the nurses.  The patient was recently diagnosed with a urinary tract infection and was empirically started on Cipro, pending cultures. Cultures showed Proteus mother this resistant to Cipro, and she was started on low-dose Bactrim. She subsequently exhibited a change in behavior and was brought to the emergency room.   Rewiew of Systems:  Unable to obtain because of advanced dementia   Past Medical History  Diagnosis Date  . Chronic kidney disease     acute renal failure  . COPD (chronic obstructive pulmonary disease)   . Diabetes mellitus   . Depression   . DEMENTIA   . Hypertension   . Shortness of breath   . GERD (gastroesophageal reflux disease)   . Gallstones   . Diverticulosis   . Concern about mental disorder without diagnosis     Past Surgical History  Procedure Laterality Date  . Abdominal hysterectomy    . Cystoscopy  2008    with stent placement  . Extracorporeal shock wave lithotripsy  03/13/2011    Procedure: EXTRACORPOREAL SHOCK WAVE LITHOTRIPSY (ESWL);  Surgeon: Ky Barban;  Location: [facility];  Service: Urology;  Laterality: Right;  Right Renal Calculus  . Cystoscopy with litholapaxy  03/28/2011    Procedure: CYSTOSCOPY WITH LITHOLAPAXY;  Surgeon: Ky Barban;  Location: [facility];  Service: Urology;  Laterality: Right;  . Cystoscopy w/ ureteral stent placement  03/28/2011    Procedure: CYSTOSCOPY WITH RETROGRADE PYELOGRAM/URETERAL STENT PLACEMENT;  Surgeon: Ky Barban;   Location: [facility];  Service: Urology;  Laterality: Right;  . Extracorporeal shock wave lithotripsy  09/25/2011    Procedure: EXTRACORPOREAL SHOCK WAVE LITHOTRIPSY (ESWL);  Surgeon: Ky Barban, MD;  Location: [facility];  Service: Urology;  Laterality: Right;  ESWL Right Renal Calculus  . Esophagogastroduodenoscopy  03/19/2005    WJX:BJYNWGN antral gastritis, need to rule out H pylori infection/Pyloric channel ulcer without stigmata of bleed/Bulbar ulcer with duodenitis without stigmata of bleed    Medications:  HOME MEDS: Prior to Admission medications   Medication Sig Start Date End Date Taking? Authorizing Provider  acetaminophen (TYLENOL) 500 MG tablet Take 500 mg by mouth 2 (two) times daily.   Yes Historical Provider, MD  ALPRAZolam Prudy Feeler) 0.5 MG tablet Take 0.5 mg by mouth 2 (two) times daily.   Yes Historical Provider, MD  amLODipine (NORVASC) 5 MG tablet Take 5 mg by mouth every morning.    Yes Historical Provider, MD  atorvastatin (LIPITOR) 10 MG tablet Take 10 mg by mouth at bedtime.    Yes Historical Provider, MD  Calcium Carb-Cholecalciferol (CALCIUM 500 +D) 500-400 MG-UNIT TABS Take 1 tablet by mouth 2 (two) times daily.     Yes Historical Provider, MD  escitalopram (LEXAPRO) 20 MG tablet Take 15 mg by mouth every morning.    Yes Historical Provider, MD  famotidine (PEPCID) 20 MG tablet Take 20 mg by mouth 2 (two) times daily.   Yes Historical Provider, MD  ferrous fumarate (HEMOCYTE - 106 MG FE) 325 (106 FE)  MG TABS Take 1 tablet by mouth.   Yes Historical Provider, MD  fluticasone (FLONASE) 50 MCG/ACT nasal spray Place 2 sprays into the nose daily. allergies    Yes Historical Provider, MD  LORazepam (ATIVAN) 2 MG/ML concentrated solution Take 1 mg by mouth once as needed (for combative behaviors).   Yes Historical Provider, MD  oxybutynin (DITROPAN-XL) 10 MG 24 hr tablet Take 10 mg by mouth every morning.    Yes Historical Provider, MD  senna (SENOKOT) 8.6 MG TABS Take 3  tablets by mouth every morning.    Yes Historical Provider, MD  sulfamethoxazole-trimethoprim (BACTRIM,SEPTRA) 400-80 MG per tablet Take 1 tablet by mouth 2 (two) times daily.   Yes Historical Provider, MD  vitamin B-12 (CYANOCOBALAMIN) 1000 MCG tablet Take 1,000 mcg by mouth every morning.    Yes Historical Provider, MD  aluminum & magnesium hydroxide (MAALOX) 225-200 MG/5ML suspension Take 30 mLs by mouth every 4 (four) hours as needed. For indigestion    Historical Provider, MD  HYDROcodone-acetaminophen (NORCO) 5-325 MG per tablet Take 1 tablet by mouth every 4 (four) hours as needed. pain     Historical Provider, MD  hydrocortisone (ANUSOL-HC) 2.5 % rectal cream Place 1 application rectally daily as needed. For irritation and hemorrhoids    Historical Provider, MD  loperamide (IMODIUM) 2 MG capsule Take 4 mg by mouth daily as needed. Takes 2 capsules after 1st loose stool. Then take 1 tab after each loose stool. Do not exceed 16 grams/24 hour period    Historical Provider, MD     Allergies:  No Known Allergies  Social History:   reports that she quit smoking about 10 years ago. Her smoking use included Cigarettes. She has a 15 pack-year smoking history. She does not have any smokeless tobacco history on file. She reports that she does not drink alcohol or use illicit drugs.  Family History: Family History  Problem Relation Age of Onset  . Anesthesia problems Neg Hx   . Hypotension Neg Hx   . Malignant hyperthermia Neg Hx   . Pseudochol deficiency Neg Hx      Physical Exam: Filed Vitals:   03/27/13 1948 03/27/13 2336  BP: 134/48 144/74  Pulse: 66 68  Temp: 98.3 F (36.8 C) 97.7 F (36.5 C)  TempSrc: Oral Oral  Resp: 16 16  Height: 5\' 3"  (1.6 m) 5\' 3"  (1.6 m)  Weight: 73.256 kg (161 lb 8 oz) 64.9 kg (143 lb 1.3 oz)  SpO2: 93%    Blood pressure 144/74, pulse 68, temperature 97.7 F (36.5 C), temperature source Oral, resp. rate 16, height 5\' 3"  (1.6 m), weight 64.9 kg (143  lb 1.3 oz), SpO2 93.00%. Body mass index is 25.35 kg/(m^2).   GEN:  Alert and [place] elderly lady lying bed; cooperative with exam with coaxing PSYCH:  alert and oriented only to self;   HEENT: Mucous membranes pink DRYand anicteric; PERRLA; EOM intact; no cervical lymphadenopathy nor thyromegaly or carotid bruit; no JVD; Breasts:: Not examined CHEST WALL: No tenderness CHEST: Normal respiration, clear to auscultation bilaterally HEART: Regular rate and rhythm; 1/6 systolic BACK: Moderate no scoliosis; no CVA tenderness ABDOMEN: , soft non-tender; no masses, no organomegaly, normal abdominal bowel sounds; no pannus; no intertriginous candida. Rectal Exam: Not done EXTREMITIES: age-appropriate arthropathy of the hands and knees; no edema; no ulcerations. Genitalia: not examined PULSES: 2+ and symmetric SKIN: Normal hydration no rash or ulceration CNS: Cranial nerves 2-12 grossly intact no focal lateralizing neurologic deficit  Labs on Admission:  Basic Metabolic Panel:  Recent Labs Lab 03/27/13 2030  NA 138  K 4.9  CL 102  CO2 26  GLUCOSE 143*  BUN 43*  CREATININE 2.09*  CALCIUM 9.6   Liver Function Tests:  Recent Labs Lab 03/27/13 2030  AST 19  ALT 10  ALKPHOS 93  BILITOT 0.2*  PROT 7.5  ALBUMIN 3.6   No results found for this basename: LIPASE, AMYLASE,  in the last 168 hours No results found for this basename: AMMONIA,  in the last 168 hours CBC:  Recent Labs Lab 03/27/13 2030  WBC 7.3  NEUTROABS 5.0  HGB 11.6*  HCT 35.3*  MCV 90.3  PLT 258   Cardiac Enzymes: No results found for this basename: CKTOTAL, CKMB, CKMBINDEX, TROPONINI,  in the last 168 hours BNP: No components found with this basename: POCBNP,  D-dimer: No components found with this basename: D-DIMER,  CBG: No results found for this basename: GLUCAP,  in the last 168 hours  Radiological Exams on Admission: No results found.     Assessment/Plan   Active Problems:   UTI  (urinary tract infection)   Agitation/  Aggressive behavior, possibly secondary to urinary tract infection, possibly metabolic effect from Cipro or Bactrim   Dementia  Dehydration Chronic renal failure  PLAN: Rocephin for urinary tract infection IV fluid hydration Haloperidol when necessary for agitation Discuss with the family  delirium may not resolve easily while she is in an unfamiliar environment   Code Status: Full code  Family Communication: Plans discuss with patient and family at bedside     Nana Vastine Nocturnist [facility] Pager [phone]   03/28/2013, 8:16 AM

## 2013-03-29 LAB — BASIC METABOLIC PANEL
BUN: 25 mg/dL — ABNORMAL HIGH (ref 6–23)
GFR calc Af Amer: 40 mL/min — ABNORMAL LOW (ref >90)
GFR calc non Af Amer: 34 mL/min — ABNORMAL LOW (ref >90)
Potassium: 4.2 mEq/L (ref 3.5–5.1)
Sodium: 143 mEq/L (ref 135–145)

## 2013-03-29 LAB — MRSA PCR SCREENING: MRSA by PCR: POSITIVE — AB

## 2013-03-29 LAB — URINE CULTURE: Colony Count: 5000

## 2013-03-29 MED ORDER — DEXTROSE-NACL 5-0.9 % IV SOLN
INTRAVENOUS | Status: DC
  Administered 2013-03-29 – 2013-03-30 (×3): via INTRAVENOUS
  Administered 2013-03-31: 1000 mL via INTRAVENOUS

## 2013-03-29 MED ORDER — MUPIROCIN 2 % EX OINT
1.0000 "application " | TOPICAL_OINTMENT | Freq: Two times a day (BID) | CUTANEOUS | Status: DC
  Administered 2013-03-29 – 2013-03-30 (×3): 1 via NASAL

## 2013-03-29 MED ORDER — CHLORHEXIDINE GLUCONATE CLOTH 2 % EX PADS
6.0000 | MEDICATED_PAD | Freq: Every day | CUTANEOUS | Status: DC
  Administered 2013-03-30 – 2013-03-31 (×2): 6 via TOPICAL

## 2013-03-29 MED ORDER — HALOPERIDOL LACTATE 5 MG/ML IJ SOLN
5.0000 mg | Freq: Once | INTRAMUSCULAR | Status: AC
  Administered 2013-03-29: 5 mg via INTRAVENOUS

## 2013-03-29 MED ORDER — DEXTROSE 5 % IV SOLN
1.0000 g | INTRAVENOUS | Status: DC
  Administered 2013-03-29 – 2013-03-30 (×2): 1 g via INTRAVENOUS
  Filled 2013-03-29 (×4): qty 10

## 2013-03-29 MED ORDER — AMLODIPINE BESYLATE 5 MG PO TABS
5.0000 mg | ORAL_TABLET | Freq: Once | ORAL | Status: AC
  Administered 2013-03-29: 5 mg via ORAL

## 2013-03-29 MED ORDER — LORAZEPAM 2 MG/ML IJ SOLN
0.5000 mg | Freq: Once | INTRAMUSCULAR | Status: AC
  Administered 2013-03-29: 0.5 mg via INTRAVENOUS
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
## 2013-03-29 NOTE — Progress Notes (Signed)
03/29/13 1424 Patient sedated/sleepy today, received ativan and haldol early this am per night shift RN. Pt arousable, opens eyes to stimulation, speech clear but goes back to sleep. Unable to take po medications safely at this time. Notified Dr. Felecia Shelling. Order received to d/c ativan order. Safety sitter remains at bedside. Earnstine Regal, RN
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
## 2013-03-29 NOTE — Care Management Note (Addendum)
    Page 1 of 1   03/31/2013     9:08:52 AM   CARE MANAGEMENT NOTE 03/31/2013  Patient:  [name] [name], [name] [name]   Account Number:  [id number]  Date Initiated:  03/28/2013  Documentation initiated by:  Lanier Clam  Subjective/Objective Assessment:   79 y/o f admitted w/uti.IO:[id number]     Action/Plan:   From snf.   Anticipated DC Date:  03/30/2013   Anticipated DC Plan:  SKILLED NURSING FACILITY  In-house referral  Clinical Social Worker      DC Planning Services  CM consult      Choice offered to / List presented to:             Status of service:  Completed, signed off Medicare Important Message given?  YES (If response is "NO", the following Medicare IM given date fields will be blank) Date Medicare IM given:  03/31/2013 Date Additional Medicare IM given:    Discharge Disposition:  SKILLED NURSING FACILITY  Per UR Regulation:  Reviewed for med. necessity/level of care/duration of stay  If discussed at Long Length of Stay Meetings, dates discussed:    Comments:  03/31/13 0900 Arlyss Queen, RN BSN CM Pt discharged back to [facility] today. CSW to arrange discharge to facility.  03/29/13 1055 Arlyss Queen, RN BSN CM Pt from [facility]. Will return at discharge.

## 2013-03-29 NOTE — Progress Notes (Signed)
CRITICAL VALUE ALERT  Critical value received:  MRSA PCR positive  Date of notification:  03/29/13  Time of notification:  1745  Critical value read back:yes  Nurse who received alert:  A. Aundria Rud, RN  MD notified (1st page):  Dr. Ouida Sills notified (on-call for Dr. Conard Novak). MRSA PCR standing orders in place. Contact precautions in place and family educated about contact precautions.   Time of first page:  1755   MD notified (2nd page):  Time of second page:   Responding MD:   Time MD responded:

## 2013-03-29 NOTE — Progress Notes (Signed)
Subjective: Patient is sleepy. She was agitated during the night requiring sedation. No fever or chills.  Objective: Vital signs in last 24 hours: Pulse Rate:  [62] 62 (11/03 0314) Resp:  [18] 18 (11/03 0314) BP: (163)/(72) 163/72 mmHg (11/03 0314) SpO2:  [96 %] 96 % (11/03 0314) Weight change:     Intake/Output from previous day: 11/02 0701 - 11/03 0700 In: 866.7 [I.V.:866.7] Out: -   PHYSICAL EXAM General appearance: alert and combative Resp: clear to auscultation bilaterally Cardio: S1, S2 normal GI: soft, non-tender; bowel sounds normal; no masses,  no organomegaly Extremities: extremities normal, atraumatic, no cyanosis or edema  Lab Results:    @labtest @ [facility] No results found for this basename: PHART, PCO2, PO2ART, TCO2, HCO3,  in the last 72 hours CULTURES No results found for this or any previous visit (from the past 240 hour(s)). Studies/Results: No results found.  Medications: I have reviewed the patient's current medications.  Assesment: Active Problems:   UTI (urinary tract infection)   Agitation   Aggressive behavior   Dementia with behavioral disturbance    Plan: Medications reviewed Continue IV Rocephin Continue Iv hydration Fall precaution Supportive care.    LOS: 2 days   Kadian Barcellos 03/29/2013, 7:46 AM

## 2013-03-30 NOTE — Progress Notes (Signed)
Quick Note:  [name] [name], please advise since pt is inpatient. ______

## 2013-03-30 NOTE — Progress Notes (Signed)
Quick Note:  [name] [name], Please advise since pt is inpatient. ______

## 2013-03-30 NOTE — Progress Notes (Signed)
Subjective: Patient is awake but confused, disoriented and agitated. She is receiving IV antibiotics and IV fluid.  Objective: Vital signs in last 24 hours: Temp:  [98.7 F (37.1 C)] 98.7 F (37.1 C) (11/03 2015) Pulse Rate:  [55-90] 71 (11/04 0701) Resp:  [16-18] 18 (11/04 0701) BP: (154-176)/(54-82) 162/82 mmHg (11/04 0701) SpO2:  [94 %-96 %] 94 % (11/04 0701) Weight:  [65.7 kg (144 lb 13.5 oz)-65.998 kg (145 lb 8 oz)] 65.7 kg (144 lb 13.5 oz) (11/04 0403) Weight change:  Last BM Date: 03/29/13  Intake/Output from previous day: 11/03 0701 - 11/04 0700 In: 1893.8 [P.O.:420; I.V.:1423.8; IV Piggyback:50] Out: -   PHYSICAL EXAM General appearance: alert and combative Resp: clear to auscultation bilaterally Cardio: S1, S2 normal GI: soft, non-tender; bowel sounds normal; no masses,  no organomegaly Extremities: extremities normal, atraumatic, no cyanosis or edema  Lab Results:    @labtest @ [facility] No results found for this basename: PHART, PCO2, PO2ART, TCO2, HCO3,  in the last 72 hours CULTURES Recent Results (from the past 240 hour(s))  URINE CULTURE     Status: None   Collection Time    03/27/13  8:43 PM      Result Value Range Status   Specimen Description URINE, CATHETERIZED   Final   Special Requests NONE   Final   Culture  Setup Time     Final   Value: 03/28/2013 21:52     Performed at [facility] Count PENDING   Incomplete   Culture     Final   Value: Culture reincubated for better growth     Performed at [facility]   Report Status PENDING   Incomplete  URINE CULTURE     Status: None   Collection Time    03/28/13 12:02 PM      Result Value Range Status   Specimen Description URINE, CLEAN CATCH   Final   Special Requests IMMUNE:COMPROMISED   Final   Culture  Setup Time     Final   Value: 03/28/2013 22:18     Performed at [facility] Count     Final   Value: 5,000 COLONIES/ML     Performed at [facility]   Culture     Final   Value: INSIGNIFICANT GROWTH     Performed at [facility]   Report Status 03/29/2013 FINAL   Final  MRSA PCR SCREENING     Status: Abnormal   Collection Time    03/29/13  8:30 AM      Result Value Range Status   MRSA by PCR POSITIVE (*) NEGATIVE Final   Comment:            The GeneXpert MRSA Assay (FDA     approved for NASAL specimens     only), is one component of a     comprehensive MRSA colonization     surveillance program. It is not     intended to diagnose MRSA     infection nor to guide or     monitor treatment for     MRSA infections.     RESULT CALLED TO, READ BACK BY AND VERIFIED WITH:     ROGERS,A. AT 1742 ON 03/29/2013 BY BAUGHAM,M.   Studies/Results: No results found.  Medications: I have reviewed the patient's current medications.  Assesment: Active Problems:   UTI (urinary tract infection)   Agitation   Aggressive behavior   Dementia  with behavioral disturbance    Plan: Medications reviewed Continue IV Rocephin Continue Iv hydration Fall precaution Supportive care.    LOS: 3 days   [name] [name] 03/30/2013, 7:51 AM

## 2013-03-31 LAB — URINE CULTURE

## 2013-03-31 LAB — BASIC METABOLIC PANEL
BUN: 12 mg/dL (ref 6–23)
CO2: 23 mEq/L (ref 19–32)
GFR calc Af Amer: 42 mL/min — ABNORMAL LOW (ref >90)
GFR calc non Af Amer: 36 mL/min — ABNORMAL LOW (ref >90)
Potassium: 3.6 mEq/L (ref 3.5–5.1)
Sodium: 140 mEq/L (ref 135–145)

## 2013-03-31 MED ORDER — HYDROCODONE-ACETAMINOPHEN 5-325 MG PO TABS: 1.0000 | ORAL_TABLET | ORAL | Status: DC | PRN

## 2013-03-31 MED ORDER — AMOXICILLIN-POT CLAVULANATE 500-125 MG PO TABS
1.0000 | ORAL_TABLET | Freq: Three times a day (TID) | ORAL | Status: DC
Start: 2013-03-31 — End: 2013-04-16

## 2013-03-31 NOTE — Discharge Summary (Signed)
Physician Discharge Summary  Patient ID: [name] [name] MRN: [id number] DOB/AGE: 1928-06-23 79 y.o. Primary Care Physician:Holger Sokolowski, MD Admit date: 03/27/2013 Discharge date: 03/31/2013    Discharge Diagnoses:   Active Problems:   UTI (urinary tract infection)   Agitation   Aggressive behavior   Dementia with behavioral disturbance     Medication List         acetaminophen 500 MG tablet  Commonly known as:  TYLENOL  Take 500 mg by mouth 2 (two) times daily.     ALPRAZolam 0.5 MG tablet  Commonly known as:  XANAX  Take 0.5 mg by mouth 2 (two) times daily.     aluminum & magnesium hydroxide 225-200 MG/5ML suspension  Commonly known as:  MAALOX  Take 30 mLs by mouth every 4 (four) hours as needed. For indigestion     amLODipine 5 MG tablet  Commonly known as:  NORVASC  Take 5 mg by mouth every morning.     amoxicillin-clavulanate 500-125 MG per tablet  Commonly known as:  AUGMENTIN  Take 1 tablet (500 mg total) by mouth 3 (three) times daily.     atorvastatin 10 MG tablet  Commonly known as:  LIPITOR  Take 10 mg by mouth at bedtime.     CALCIUM 500 +D 500-400 MG-UNIT Tabs  Generic drug:  Calcium Carb-Cholecalciferol  Take 1 tablet by mouth 2 (two) times daily.     escitalopram 20 MG tablet  Commonly known as:  LEXAPRO  Take 15 mg by mouth every morning.     famotidine 20 MG tablet  Commonly known as:  PEPCID  Take 20 mg by mouth 2 (two) times daily.     ferrous fumarate 325 (106 FE) MG Tabs tablet  Commonly known as:  HEMOCYTE - 106 mg FE  Take 1 tablet by mouth.     fluticasone 50 MCG/ACT nasal spray  Commonly known as:  FLONASE  Place 2 sprays into the nose daily. allergies     HYDROcodone-acetaminophen 5-325 MG per tablet  Commonly known as:  NORCO/VICODIN  Take 1 tablet by mouth every 4 (four) hours as needed. pain     hydrocortisone 2.5 % rectal cream  Commonly known as:  ANUSOL-HC  Place 1 application rectally daily as needed. For  irritation and hemorrhoids     loperamide 2 MG capsule  Commonly known as:  IMODIUM  Take 4 mg by mouth daily as needed. Takes 2 capsules after 1st loose stool. Then take 1 tab after each loose stool. Do not exceed 16 grams/24 hour period     LORazepam 2 MG/ML concentrated solution  Commonly known as:  ATIVAN  Take 1 mg by mouth once as needed (for combative behaviors).     oxybutynin 10 MG 24 hr tablet  Commonly known as:  DITROPAN-XL  Take 10 mg by mouth every morning.     senna 8.6 MG Tabs tablet  Commonly known as:  SENOKOT  Take 3 tablets by mouth every morning.     sulfamethoxazole-trimethoprim 400-80 MG per tablet  Commonly known as:  BACTRIM,SEPTRA  Take 1 tablet by mouth 2 (two) times daily.     vitamin B-12 1000 MCG tablet  Commonly known as:  CYANOCOBALAMIN  Take 1,000 mcg by mouth every morning.        Discharged Condition: improved    Consults: none  Significant Diagnostic Studies: Ct Abdomen Pelvis Wo Contrast  03/03/2013   CLINICAL DATA:  Abdominal pain.  EXAM: CT ABDOMEN AND PELVIS  WITHOUT CONTRAST  TECHNIQUE: Multidetector CT imaging of the abdomen and pelvis was performed following the standard protocol without intravenous contrast.  COMPARISON:  CT scan of February 21, 2012.  FINDINGS: These unenhanced images demonstrate no definite abnormality involving the liver, spleen or pancreas. Solitary gallstone is noted. Adrenal glands appear normal. Calculus measuring 10 x 4 mm is again noted in lower pole collecting system of right kidney. No hydronephrosis or renal obstruction is noted. No ureteral calculi are noted. IVC filter is noted an infrarenal position. Atherosclerotic calcifications of the abdominal aorta are noted. Sigmoid diverticulosis is noted without inflammation. No evidence of bowel obstruction is noted. No abnormal fluid collection is noted. Urinary bladder is decompressed.  IMPRESSION: Solitary gallstone. Nonobstructive right nephrolithiasis.  Sigmoid diverticulosis without inflammation. No acute abnormality seen in the abdomen or pelvis.   Electronically Signed   By: Roque Lias M.D.   On: 03/03/2013 10:36    Lab Results: Basic Metabolic Panel:  Recent Labs  09/81/19 0459 03/31/13 0526  NA 143 140  K 4.2 3.6  CL 109 107  CO2 22 23  GLUCOSE 93 109*  BUN 25* 12  CREATININE 1.40* 1.34*  CALCIUM 9.3 9.5   Liver Function Tests: No results found for this basename: AST, ALT, ALKPHOS, BILITOT, PROT, ALBUMIN,  in the last 72 hours   CBC:  Recent Labs  03/28/13 1103  WBC 6.7  HGB 11.7*  HCT 35.4*  MCV 90.3  PLT 254    Recent Results (from the past 240 hour(s))  URINE CULTURE     Status: None   Collection Time    03/27/13  8:43 PM      Result Value Range Status   Specimen Description URINE, CATHETERIZED   Final   Special Requests NONE   Final   Culture  Setup Time     Final   Value: 03/28/2013 21:52     Performed at [facility] Count     Final   Value: 50,000 COLONIES/ML     Performed at [facility]   Culture     Final   Value: PROTEUS MIRABILIS     Performed at [facility]   Report Status PENDING   Incomplete  URINE CULTURE     Status: None   Collection Time    03/28/13 12:02 PM      Result Value Range Status   Specimen Description URINE, CLEAN CATCH   Final   Special Requests IMMUNE:COMPROMISED   Final   Culture  Setup Time     Final   Value: 03/28/2013 22:18     Performed at [facility] Count     Final   Value: 5,000 COLONIES/ML     Performed at [facility]   Culture     Final   Value: INSIGNIFICANT GROWTH     Performed at [facility]   Report Status 03/29/2013 FINAL   Final  MRSA PCR SCREENING     Status: Abnormal   Collection Time    03/29/13  8:30 AM      Result Value Range Status   MRSA by PCR POSITIVE (*) NEGATIVE Final   Comment:            The GeneXpert MRSA Assay (FDA     approved for NASAL specimens      only), is one component of a     comprehensive MRSA colonization     surveillance  program. It is not     intended to diagnose MRSA     infection nor to guide or     monitor treatment for     MRSA infections.     RESULT CALLED TO, READ BACK BY AND VERIFIED WITH:     ROGERS,A. AT 1742 ON 03/29/2013 BY BAUGHAM,M.     [facility] Course:  Patient was admitted due to agitation and change in her mental status. Her urinalysis was abnormal and patient was admitted as case of UTI. She was started on Iv rocephin. However, her urine culture didn't grew significant bacteria. Patient improved. Her mental status is back to base line. Patient will be discharged on oral antibiotics.  Discharge Exam: Blood pressure 153/54, pulse 40, temperature 98.2 F (36.8 C), temperature source Oral, resp. rate 18, height 5\' 3"  (1.6 m), weight 63.821 kg (140 lb 11.2 oz), SpO2 96.00%.   Disposition:  Nursing home      Signed: Arora Coakley  03/31/2013, 7:52 AM

## 2013-03-31 NOTE — Progress Notes (Signed)
Pt is to be discharged back to [facility] today. Pt is in NAD, IV is out, all paperwork has been reviewed/discussed with patient/nursing staff, and there are no questions/concerns at this time. Assessment is unchanged from this morning. Report has been called. Pt is to be accompanied downstairs by staff and family via EMS.

## 2013-03-31 NOTE — Progress Notes (Signed)
UR chart review completed.  

## 2013-03-31 NOTE — Clinical Social Work Note (Signed)
Pt d/c today back to [facility]. Pt's son, Chrissie Noa and facility aware and agreeable. Family request transfer via [facility]. D/C summary and FL2 faxed. Facility aware that aggression was noted in early admission, possibly due to UTI but no agitation reported yesterday or today.  Derenda Fennel, [place] [phone]

## 2013-04-02 NOTE — Progress Notes (Signed)
Quick Note:  Given recent events, hospitalization, greater than 5 weeks since seen, let's get her an expedited OV and then we can schedule her for EGD. ______

## 2013-04-04 ENCOUNTER — Emergency Department (HOSPITAL_COMMUNITY): Payer: PRIVATE HEALTH INSURANCE

## 2013-04-04 ENCOUNTER — Emergency Department (HOSPITAL_COMMUNITY)
Admission: EM | Admit: 2013-04-04 | Discharge: 2013-04-05 | Disposition: A | Discharge status: Home / Self Care | Payer: PRIVATE HEALTH INSURANCE | Attending: Emergency Medicine | Admitting: Emergency Medicine

## 2013-04-04 ENCOUNTER — Encounter (HOSPITAL_COMMUNITY): Payer: Self-pay | Admitting: Emergency Medicine

## 2013-04-04 DIAGNOSIS — Z87440 Personal history of urinary (tract) infections: Secondary | ICD-10-CM | POA: Insufficient documentation

## 2013-04-04 DIAGNOSIS — F329 Major depressive disorder, single episode, unspecified: Secondary | ICD-10-CM | POA: Insufficient documentation

## 2013-04-04 DIAGNOSIS — N189 Chronic kidney disease, unspecified: Secondary | ICD-10-CM | POA: Insufficient documentation

## 2013-04-04 DIAGNOSIS — K219 Gastro-esophageal reflux disease without esophagitis: Secondary | ICD-10-CM | POA: Insufficient documentation

## 2013-04-04 DIAGNOSIS — R4182 Altered mental status, unspecified: Secondary | ICD-10-CM | POA: Insufficient documentation

## 2013-04-04 DIAGNOSIS — F0281 Dementia in other diseases classified elsewhere with behavioral disturbance: Secondary | ICD-10-CM | POA: Insufficient documentation

## 2013-04-04 DIAGNOSIS — Z792 Long term (current) use of antibiotics: Secondary | ICD-10-CM | POA: Insufficient documentation

## 2013-04-04 DIAGNOSIS — Z87891 Personal history of nicotine dependence: Secondary | ICD-10-CM | POA: Insufficient documentation

## 2013-04-04 DIAGNOSIS — F02818 Dementia in other diseases classified elsewhere, unspecified severity, with other behavioral disturbance: Secondary | ICD-10-CM | POA: Insufficient documentation

## 2013-04-04 DIAGNOSIS — W050XXA Fall from non-moving wheelchair, initial encounter: Secondary | ICD-10-CM | POA: Insufficient documentation

## 2013-04-04 DIAGNOSIS — J449 Chronic obstructive pulmonary disease, unspecified: Secondary | ICD-10-CM | POA: Insufficient documentation

## 2013-04-04 DIAGNOSIS — F3289 Other specified depressive episodes: Secondary | ICD-10-CM | POA: Insufficient documentation

## 2013-04-04 DIAGNOSIS — I129 Hypertensive chronic kidney disease with stage 1 through stage 4 chronic kidney disease, or unspecified chronic kidney disease: Secondary | ICD-10-CM | POA: Insufficient documentation

## 2013-04-04 DIAGNOSIS — E119 Type 2 diabetes mellitus without complications: Secondary | ICD-10-CM | POA: Insufficient documentation

## 2013-04-04 DIAGNOSIS — W19XXXA Unspecified fall, initial encounter: Secondary | ICD-10-CM

## 2013-04-04 DIAGNOSIS — F29 Unspecified psychosis not due to a substance or known physiological condition: Secondary | ICD-10-CM | POA: Insufficient documentation

## 2013-04-04 DIAGNOSIS — IMO0002 Reserved for concepts with insufficient information to code with codable children: Secondary | ICD-10-CM | POA: Insufficient documentation

## 2013-04-04 DIAGNOSIS — J4489 Other specified chronic obstructive pulmonary disease: Secondary | ICD-10-CM | POA: Insufficient documentation

## 2013-04-04 DIAGNOSIS — Y9210 Unspecified residential institution as the place of occurrence of the external cause: Secondary | ICD-10-CM | POA: Insufficient documentation

## 2013-04-04 DIAGNOSIS — Z79899 Other long term (current) drug therapy: Secondary | ICD-10-CM | POA: Insufficient documentation

## 2013-04-04 DIAGNOSIS — S0993XA Unspecified injury of face, initial encounter: Secondary | ICD-10-CM | POA: Insufficient documentation

## 2013-04-04 DIAGNOSIS — Y939 Activity, unspecified: Secondary | ICD-10-CM | POA: Insufficient documentation

## 2013-04-04 DIAGNOSIS — S79919A Unspecified injury of unspecified hip, initial encounter: Secondary | ICD-10-CM | POA: Insufficient documentation

## 2013-04-04 NOTE — ED Notes (Signed)
Patient presents to ER via [facility] s/p fall.  Patient slid out of wheelchair.  Patient c/o right hip pain and neck pain.

## 2013-04-04 NOTE — ED Provider Notes (Signed)
CSN: [id number]     Arrival date & time 04/04/13  2012 History   First MD Initiated Contact with Patient 04/04/13 2119    Scribed for Benny Lennert, MD, the patient was seen in room APA19/APA19. This chart was scribed by Lewanda Rife, ED scribe. Patient's care was started at 9:34 PM   Chief Complaint  Patient presents with  . Fall  . Hip Pain  . Neck Pain   (Consider location/radiation/quality/duration/timing/severity/associated sxs/prior Treatment) Level 5 caveat applies due to dementia and altered mental status.  Patient is a 79 y.o. female presenting with altered mental status. The history is provided by the patient, medical records and a relative. No language interpreter was used.  Altered Mental Status Presenting symptoms: behavior changes, confusion and lethargy   Severity:  Moderate Most recent episode:  Today Episode history:  Multiple Timing:  Constant Progression:  Worsening Chronicity:  Recurrent Context: dementia, nursing home resident and recent infection (UTI)   HPI Comments: MAROLYN URSCHEL is a 79 y.o. female brought in by ambulance, who presents to the Emergency Department with PMHx of dementia complaining of fall onset PTA. Reports associated worsening confusion. Reports recent dx of UTI and admission 03/27/13-03/31/13. Daughter states pt is still currently taking abx for UTI.  Reports she stays at [facility] nursing [facility]. Past Medical History  Diagnosis Date  . Chronic kidney disease     acute renal failure  . COPD (chronic obstructive pulmonary disease)   . Diabetes mellitus   . Depression   . DEMENTIA   . Hypertension   . Shortness of breath   . GERD (gastroesophageal reflux disease)   . Gallstones   . Diverticulosis   . Concern about mental disorder without diagnosis    Past Surgical History  Procedure Laterality Date  . Abdominal hysterectomy    . Cystoscopy  2008    with stent placement  . Extracorporeal shock wave lithotripsy  03/13/2011     Procedure: EXTRACORPOREAL SHOCK WAVE LITHOTRIPSY (ESWL);  Surgeon: Ky Barban;  Location: [facility];  Service: Urology;  Laterality: Right;  Right Renal Calculus  . Cystoscopy with litholapaxy  03/28/2011    Procedure: CYSTOSCOPY WITH LITHOLAPAXY;  Surgeon: Ky Barban;  Location: [facility];  Service: Urology;  Laterality: Right;  . Cystoscopy w/ ureteral stent placement  03/28/2011    Procedure: CYSTOSCOPY WITH RETROGRADE PYELOGRAM/URETERAL STENT PLACEMENT;  Surgeon: Ky Barban;  Location: [facility];  Service: Urology;  Laterality: Right;  . Extracorporeal shock wave lithotripsy  09/25/2011    Procedure: EXTRACORPOREAL SHOCK WAVE LITHOTRIPSY (ESWL);  Surgeon: Ky Barban, MD;  Location: [facility];  Service: Urology;  Laterality: Right;  ESWL Right Renal Calculus  . Esophagogastroduodenoscopy  03/19/2005    HYQ:MVHQION antral gastritis, need to rule out H pylori infection/Pyloric channel ulcer without stigmata of bleed/Bulbar ulcer with duodenitis without stigmata of bleed   Family History  Problem Relation Age of Onset  . Anesthesia problems Neg Hx   . Hypotension Neg Hx   . Malignant hyperthermia Neg Hx   . Pseudochol deficiency Neg Hx    History  Substance Use Topics  . Smoking status: Former Smoker -- 0.50 packs/day for 30 years    Types: Cigarettes    Quit date: 03/25/2003  . Smokeless tobacco: Not on file  . Alcohol Use: No   OB History   Grav Para Term Preterm Abortions TAB SAB Ect Mult Living  Review of Systems  Unable to perform ROS: Dementia  Psychiatric/Behavioral: Positive for confusion.    Allergies  Review of patient's allergies indicates no known allergies.  Home Medications   Current Outpatient Rx  Name  Route  Sig  Dispense  Refill  . ALPRAZolam (XANAX) 0.5 MG tablet   Oral   Take 0.5 mg by mouth 2 (two) times daily.         Marland Kitchen amLODipine (NORVASC) 5 MG tablet   Oral   Take 5 mg by mouth every morning.          Marland Kitchen  amoxicillin-clavulanate (AUGMENTIN) 500-125 MG per tablet   Oral   Take 1 tablet (500 mg total) by mouth 3 (three) times daily.   10 tablet   0   . atorvastatin (LIPITOR) 10 MG tablet   Oral   Take 10 mg by mouth at bedtime.          . Calcium Carb-Cholecalciferol (CALCIUM 500 +D) 500-400 MG-UNIT TABS   Oral   Take 1 tablet by mouth 2 (two) times daily.           Marland Kitchen escitalopram (LEXAPRO) 10 MG tablet   Oral   Take 15 mg by mouth daily.         . famotidine (PEPCID) 20 MG tablet   Oral   Take 20 mg by mouth 2 (two) times daily.         . ferrous fumarate (HEMOCYTE - 106 MG FE) 325 (106 FE) MG TABS   Oral   Take 1 tablet by mouth daily.          . fluticasone (FLONASE) 50 MCG/ACT nasal spray   Nasal   Place 2 sprays into the nose daily. allergies          . HYDROcodone-acetaminophen (NORCO/VICODIN) 5-325 MG per tablet   Oral   Take 1 tablet by mouth 2 (two) times daily as needed for moderate pain. pain         . oxybutynin (DITROPAN-XL) 10 MG 24 hr tablet   Oral   Take 10 mg by mouth every morning.          . senna (SENOKOT) 8.6 MG TABS   Oral   Take 3 tablets by mouth every morning.          . vitamin B-12 (CYANOCOBALAMIN) 1000 MCG tablet   Oral   Take 1,000 mcg by mouth every morning.          Marland Kitchen acetaminophen (TYLENOL) 500 MG tablet   Oral   Take 500 mg by mouth 2 (two) times daily.         Marland Kitchen aluminum & magnesium hydroxide (MAALOX) 225-200 MG/5ML suspension   Oral   Take 30 mLs by mouth every 4 (four) hours as needed. For indigestion         . hydrocortisone (ANUSOL-HC) 2.5 % rectal cream   Rectal   Place 1 application rectally daily as needed. For irritation and hemorrhoids         . loperamide (IMODIUM) 2 MG capsule   Oral   Take 4 mg by mouth daily as needed. Takes 2 capsules after 1st loose stool. Then take 1 tab after each loose stool. Do not exceed 16 grams/24 hour period          BP 138/48  Pulse 53  Temp(Src) 97.5 F  (36.4 C) (Oral)  Resp 16  SpO2 94% Physical Exam  Nursing note and vitals reviewed.  Constitutional: She appears well-developed.  HENT:  Head: Normocephalic.  Eyes: Conjunctivae and EOM are normal. No scleral icterus.  Neck: Neck supple. No thyromegaly present.  Cardiovascular: Normal rate and regular rhythm.  Exam reveals no gallop and no friction rub.   No murmur heard. Pulmonary/Chest: No stridor. She has no wheezes. She has no rales. She exhibits no tenderness.  Abdominal: She exhibits no distension. There is no tenderness. There is no rebound.  Musculoskeletal: Normal range of motion. She exhibits no edema.  Unable to identify any pain   Lymphadenopathy:    She has no cervical adenopathy.  Neurological: She exhibits normal muscle tone. Coordination normal.  Oriented only to person   Skin: No rash noted. No erythema.  Psychiatric: She has a normal mood and affect. Her behavior is normal.    ED Course  Procedures (including critical care time) COORDINATION OF CARE:  Nursing notes reviewed. Vital signs reviewed. Initial pt interview and examination performed.   9:39 PM-Discussed work up plan with pt and daughter at bedside, which includes CT of head and x-ray of pelvis. Daugther agrees with plan.   Treatment plan initiated:Medications - No data to display   Initial diagnostic testing ordered.     Labs Review Labs Reviewed - No data to display Imaging Review No results found.  EKG Interpretation   None       MDM  No diagnosis found.   The chart was scribed for me under my direct supervision.  I personally performed the history, physical, and medical decision making and all procedures in the evaluation of this patient.Benny Lennert, MD 04/04/13 2300

## 2013-04-04 NOTE — ED Notes (Signed)
Pt to be discharged back to nursing facility.  Report called.

## 2013-04-05 NOTE — ED Notes (Signed)
Pt transported back to [facility] by [facility]

## 2013-04-16 ENCOUNTER — Ambulatory Visit: Payer: PRIVATE HEALTH INSURANCE | Admitting: Gastroenterology

## 2013-04-16 ENCOUNTER — Ambulatory Visit (INDEPENDENT_AMBULATORY_CARE_PROVIDER_SITE_OTHER): Payer: PRIVATE HEALTH INSURANCE | Admitting: Gastroenterology

## 2013-04-16 ENCOUNTER — Encounter: Payer: Self-pay | Admitting: Gastroenterology

## 2013-04-16 ENCOUNTER — Telehealth: Payer: Self-pay | Admitting: Gastroenterology

## 2013-04-16 VITALS — BP 132/64 | HR 59 | Temp 96.6°F

## 2013-04-16 DIAGNOSIS — G8929 Other chronic pain: Secondary | ICD-10-CM

## 2013-04-16 DIAGNOSIS — R634 Abnormal weight loss: Secondary | ICD-10-CM

## 2013-04-16 DIAGNOSIS — R1084 Generalized abdominal pain: Secondary | ICD-10-CM

## 2013-04-16 MED ORDER — PANTOPRAZOLE SODIUM 40 MG PO TBEC: 40.0000 mg | DELAYED_RELEASE_TABLET | Freq: Every day | ORAL | Status: AC

## 2013-04-16 NOTE — Progress Notes (Signed)
Cc PCP 

## 2013-04-16 NOTE — Telephone Encounter (Signed)
Pt was a no show

## 2013-04-16 NOTE — Patient Instructions (Addendum)
1. Add pantoprazole 40 mg 30 minutes before breakfast daily. 2. Stop pepcid. 3. Allergies discuss further workup with patient's daughter, Hulan Fess, further recommendations to follow.

## 2013-04-16 NOTE — Telephone Encounter (Signed)
Pt showed up late at 1021am. [facility]SL agreed to see patient.

## 2013-04-16 NOTE — Progress Notes (Signed)
Primary Care Physician: Avon Gully, MD  Primary Gastroenterologist:  Roetta Sessions, MD   Chief Complaint  Patient presents with  . Follow-up    HPI: [name] [name] is a 78 y.o. female here for follow-up and consider EGD for diminished oral intake, 10 pound weight loss. Seen back in 02/2013. She had CT A/P without contrast that showed single gallstone, nonobstructive kidney stone. Labs at that time were good except for creatinine of 2.3. LFTs and CBC were normal. We were in the process of arranging for an upper endoscopy the patient ended up in the hospital  03/27/13 through 05/31/12 for UTI, agitation/agressive behavior.  She presents today with staff that is not aware of her current state. Patient is unable to provide any history due to her dementia. She is quite agitated in the office wanting to leave. During her office visit last month however she was worked home and was able to tell me that she was having heartburn and upper abdominal pain.  Current Outpatient Prescriptions  Medication Sig Dispense Refill  . acetaminophen (TYLENOL) 500 MG tablet Take 500 mg by mouth 2 (two) times daily.      Marland Kitchen ALPRAZolam (XANAX) 0.5 MG tablet Take 0.5 mg by mouth 2 (two) times daily.      Marland Kitchen aluminum & magnesium hydroxide (MAALOX) 225-200 MG/5ML suspension Take 30 mLs by mouth every 4 (four) hours as needed. For indigestion      . amLODipine (NORVASC) 5 MG tablet Take 5 mg by mouth every morning.       Marland Kitchen amoxicillin-clavulanate (AUGMENTIN) 500-125 MG per tablet Take 1 tablet (500 mg total) by mouth 3 (three) times daily.  10 tablet  0  . atorvastatin (LIPITOR) 10 MG tablet Take 10 mg by mouth at bedtime.       . Calcium Carb-Cholecalciferol (CALCIUM 500 +D) 500-400 MG-UNIT TABS Take 1 tablet by mouth 2 (two) times daily.        Marland Kitchen escitalopram (LEXAPRO) 10 MG tablet Take 15 mg by mouth daily.      . famotidine (PEPCID) 20 MG tablet Take 20 mg by mouth 2 (two) times daily.      . ferrous fumarate (HEMOCYTE  - 106 MG FE) 325 (106 FE) MG TABS Take 1 tablet by mouth daily.       . fluticasone (FLONASE) 50 MCG/ACT nasal spray Place 2 sprays into the nose daily. allergies       . HYDROcodone-acetaminophen (NORCO/VICODIN) 5-325 MG per tablet Take 1 tablet by mouth 2 (two) times daily as needed for moderate pain. pain      . hydrocortisone (ANUSOL-HC) 2.5 % rectal cream Place 1 application rectally daily as needed. For irritation and hemorrhoids      . loperamide (IMODIUM) 2 MG capsule Take 4 mg by mouth daily as needed. Takes 2 capsules after 1st loose stool. Then take 1 tab after each loose stool. Do not exceed 16 grams/24 hour period      . oxybutynin (DITROPAN-XL) 10 MG 24 hr tablet Take 10 mg by mouth every morning.       . senna (SENOKOT) 8.6 MG TABS Take 3 tablets by mouth every morning.       . vitamin B-12 (CYANOCOBALAMIN) 1000 MCG tablet Take 1,000 mcg by mouth every morning.        No current facility-administered medications for this visit.    Allergies as of 04/16/2013  . (No Known Allergies)   Past Medical History  Diagnosis Date  . Chronic  kidney disease     acute renal failure  . COPD (chronic obstructive pulmonary disease)   . Diabetes mellitus   . Depression   . DEMENTIA   . Hypertension   . Shortness of breath   . GERD (gastroesophageal reflux disease)   . Gallstones   . Diverticulosis   . Concern about mental disorder without diagnosis    Past Surgical History  Procedure Laterality Date  . Abdominal hysterectomy    . Cystoscopy  2008    with stent placement  . Extracorporeal shock wave lithotripsy  03/13/2011    Procedure: EXTRACORPOREAL SHOCK WAVE LITHOTRIPSY (ESWL);  Surgeon: Ky Barban;  Location: [facility];  Service: Urology;  Laterality: Right;  Right Renal Calculus  . Cystoscopy with litholapaxy  03/28/2011    Procedure: CYSTOSCOPY WITH LITHOLAPAXY;  Surgeon: Ky Barban;  Location: [facility];  Service: Urology;  Laterality: Right;  . Cystoscopy w/  ureteral stent placement  03/28/2011    Procedure: CYSTOSCOPY WITH RETROGRADE PYELOGRAM/URETERAL STENT PLACEMENT;  Surgeon: Ky Barban;  Location: [facility];  Service: Urology;  Laterality: Right;  . Extracorporeal shock wave lithotripsy  09/25/2011    Procedure: EXTRACORPOREAL SHOCK WAVE LITHOTRIPSY (ESWL);  Surgeon: Ky Barban, MD;  Location: [facility];  Service: Urology;  Laterality: Right;  ESWL Right Renal Calculus  . Esophagogastroduodenoscopy  03/19/2005    WGN:FAOZHYQ antral gastritis, need to rule out H pylori infection/Pyloric channel ulcer without stigmata of bleed/Bulbar ulcer with duodenitis without stigmata of bleed   Family History  Problem Relation Age of Onset  . Anesthesia problems Neg Hx   . Hypotension Neg Hx   . Malignant hyperthermia Neg Hx   . Pseudochol deficiency Neg Hx    History   Social History  . Marital Status: Widowed    Spouse Name: N/A    Number of Children: N/A  . Years of Education: N/A   Social History Main Topics  . Smoking status: Former Smoker -- 0.50 packs/day for 30 years    Types: Cigarettes    Quit date: 03/25/2003  . Smokeless tobacco: None  . Alcohol Use: No  . Drug Use: No  . Sexual Activity: No   Other Topics Concern  . None   Social History Narrative  . None    ROS: Unable to obtain    Physical Examination:   BP 132/64  Pulse 59  Temp(Src) 96.6 F (35.9 C) (Oral)  General: Agitated, sitting in wheelchair per staff patient is unable to ambulate since her last hospitalization  no acute distress.  Eyes: No icterus. Mouth: Oropharyngeal mucosa moist and pink , no lesions erythema or exudate. Lungs: Clear to auscultation bilaterally.  Heart: Regular rate and rhythm, no murmurs rubs or gallops.  Abdomen: Bowel sounds are normal, nontender, nondistended, no hepatosplenomegaly or masses, no abdominal bruits or hernia , no rebound or guarding.   Extremities: No lower extremity edema. No clubbing or deformities. Neuro:  Alert. Not oriented to place or time  Skin: Warm and dry, no jaundice.   Psych: Agitated   Labs:  Lab Results  Component Value Date   CREATININE 1.34* 03/31/2013   BUN 12 03/31/2013   NA 140 03/31/2013   K 3.6 03/31/2013   CL 107 03/31/2013   CO2 23 03/31/2013   Lab Results  Component Value Date   ALT 10 03/27/2013   AST 19 03/27/2013   ALKPHOS 93 03/27/2013   BILITOT 0.2* 03/27/2013   Lab Results  Component Value Date  WBC 6.7 03/28/2013   HGB 11.7* 03/28/2013   HCT 35.4* 03/28/2013   MCV 90.3 03/28/2013   PLT 254 03/28/2013    Imaging Studies: Dg Pelvis 1-2 Views  04/04/2013   CLINICAL DATA:  Status post fall; right hip pain.  EXAM: PELVIS - 1-2 VIEW  COMPARISON:  CT of the abdomen and pelvis performed 03/03/2013  FINDINGS: There is no evidence of fracture or dislocation. Both femoral heads are seated normally within their respective acetabula. Degenerative change is noted at the lower lumbar spine. The sacroiliac joints are unremarkable in appearance.  The visualized bowel gas pattern is grossly unremarkable in appearance. Scattered vascular calcifications are seen.  IMPRESSION: 1. No evidence of fracture or dislocation. 2. Scattered vascular calcifications seen.   Electronically Signed   By: Roanna Raider M.D.   On: 04/04/2013 22:05   Ct Head Wo Contrast  04/04/2013   CLINICAL DATA:  Neck pain following a fall.  EXAM: CT HEAD WITHOUT CONTRAST  TECHNIQUE: Contiguous axial images were obtained from the base of the skull through the vertex without intravenous contrast.  COMPARISON:  07/22/2012.  FINDINGS: Fifth the stable enlarged ventricles and subarachnoid spaces. Stable patchy white matter low density in both cerebral hemispheres. Stable old left thalamic infarct. No skull fracture, intracranial hemorrhage or paranasal sinus air-fluid levels.  IMPRESSION: No acute abnormality. Stable atrophy and chronic small vessel white matter ischemic changes.   Electronically Signed   By: Gordan Payment  M.D.   On: 04/04/2013 22:25

## 2013-04-16 NOTE — Assessment & Plan Note (Signed)
79 year old lady with history of dementia, recent hospitalization due to UTI and agitation, who presents for followup. We had planned to offered upper endoscopy for further evaluation of diminished oral intake, weight loss, complaints of abdominal pain. However in the meantime she was hospitalized. Currently in the office today she is quite agitated. She is unable to provide any history. Workup previously as outlined above. I will touch base with her healthcare power of attorney which is listed as her daughter [name] [name], [phone]. I'll talk with her regarding her wishes for possible upper endoscopy. In the meantime, will add PPI such as omeprazole 20 mg daily.

## 2013-04-29 ENCOUNTER — Telehealth: Payer: Self-pay | Admitting: Gastroenterology

## 2013-04-29 NOTE — Telephone Encounter (Signed)
Left message for POA to return call to discuss possible EGD.

## 2013-04-30 NOTE — Telephone Encounter (Signed)
Returning phone call phone call to [place].  Will call back Monday or you can call her back when you get in.

## 2013-05-01 ENCOUNTER — Encounter (HOSPITAL_COMMUNITY): Payer: Self-pay | Admitting: Emergency Medicine

## 2013-05-01 ENCOUNTER — Emergency Department (HOSPITAL_COMMUNITY): Payer: PRIVATE HEALTH INSURANCE

## 2013-05-01 ENCOUNTER — Emergency Department (HOSPITAL_COMMUNITY)
Admission: EM | Admit: 2013-05-01 | Discharge: 2013-05-02 | Disposition: A | Discharge status: Home / Self Care | Payer: PRIVATE HEALTH INSURANCE | Attending: Emergency Medicine | Admitting: Emergency Medicine

## 2013-05-01 DIAGNOSIS — K219 Gastro-esophageal reflux disease without esophagitis: Secondary | ICD-10-CM | POA: Diagnosis not present

## 2013-05-01 DIAGNOSIS — J441 Chronic obstructive pulmonary disease with (acute) exacerbation: Secondary | ICD-10-CM | POA: Insufficient documentation

## 2013-05-01 DIAGNOSIS — E119 Type 2 diabetes mellitus without complications: Secondary | ICD-10-CM | POA: Insufficient documentation

## 2013-05-01 DIAGNOSIS — I441 Atrioventricular block, second degree: Secondary | ICD-10-CM | POA: Diagnosis not present

## 2013-05-01 DIAGNOSIS — N189 Chronic kidney disease, unspecified: Secondary | ICD-10-CM | POA: Diagnosis not present

## 2013-05-01 DIAGNOSIS — R4182 Altered mental status, unspecified: Secondary | ICD-10-CM | POA: Diagnosis present

## 2013-05-01 DIAGNOSIS — F0390 Unspecified dementia without behavioral disturbance: Secondary | ICD-10-CM

## 2013-05-01 DIAGNOSIS — F329 Major depressive disorder, single episode, unspecified: Secondary | ICD-10-CM | POA: Insufficient documentation

## 2013-05-01 DIAGNOSIS — Z87891 Personal history of nicotine dependence: Secondary | ICD-10-CM | POA: Insufficient documentation

## 2013-05-01 DIAGNOSIS — I10 Essential (primary) hypertension: Secondary | ICD-10-CM | POA: Diagnosis not present

## 2013-05-01 DIAGNOSIS — Z79899 Other long term (current) drug therapy: Secondary | ICD-10-CM | POA: Diagnosis not present

## 2013-05-01 DIAGNOSIS — F3289 Other specified depressive episodes: Secondary | ICD-10-CM | POA: Insufficient documentation

## 2013-05-01 LAB — TROPONIN I: Troponin I: <0.3 ng/mL (ref <0.30)

## 2013-05-01 LAB — COMPREHENSIVE METABOLIC PANEL
ALT: 11 U/L (ref 0–35)
AST: 20 U/L (ref 0–37)
Albumin: 3.6 g/dL (ref 3.5–5.2)
Alkaline Phosphatase: 108 U/L (ref 39–117)
BUN: 30 mg/dL — ABNORMAL HIGH (ref 6–23)
Chloride: 106 mEq/L (ref 96–112)
Potassium: 4.4 mEq/L (ref 3.5–5.1)
Total Bilirubin: 0.5 mg/dL (ref 0.3–1.2)

## 2013-05-01 LAB — CBC WITH DIFFERENTIAL/PLATELET
Basophils Absolute: 0 10*3/uL (ref 0.0–0.1)
Basophils Relative: 0 % (ref 0–1)
Eosinophils Relative: 3 % (ref 0–5)
HCT: 35.4 % — ABNORMAL LOW (ref 36.0–46.0)
Hemoglobin: 11.4 g/dL — ABNORMAL LOW (ref 12.0–15.0)
MCHC: 32.2 g/dL (ref 30.0–36.0)
Monocytes Relative: 5 % (ref 3–12)
Neutro Abs: 8 10*3/uL — ABNORMAL HIGH (ref 1.7–7.7)
Neutrophils Relative %: 78 % — ABNORMAL HIGH (ref 43–77)

## 2013-05-01 MED ORDER — SODIUM CHLORIDE 0.9 % IV BOLUS (SEPSIS): 500.0000 mL | Freq: Once | INTRAVENOUS | Status: DC

## 2013-05-01 MED ORDER — SODIUM CHLORIDE 0.9 % IV SOLN: Freq: Once | INTRAVENOUS | Status: DC

## 2013-05-01 NOTE — ED Notes (Signed)
Attempted to start IV x 2 unsuccessful on left arm.

## 2013-05-01 NOTE — ED Provider Notes (Signed)
CSN: [id number]     Arrival date & time 05/01/13  2029 History   First MD Initiated Contact with Patient 05/01/13 2035     Chief Complaint  Patient presents with  . Altered Mental Status   Level 5 caveat for dementia  (Consider location/radiation/quality/duration/timing/severity/associated sxs/prior Treatment) HPI  Patient sent from her nursing home for evaluation of change of mental status. Daughter and son are here and states about 6 weeks ago patient has had a change in her mental status. They report she has been having drooping of the right side of her face including her mouth. She also seems to be more confused and does not recognize who they are. They also report her speech is getting more in comprehensible. Tonight after getting Xanax for agitation at 3:30 PM, they were attending to feed her about 6:30 PM and she was pocketing her food. They thought she had right-sided weakness and slurred speech. When asked if she has pain patient states yes, however when I asked her to show me where she hurts she is unable to show me where she has pain. Daughter also reports she's been getting increasingly more agitated and combative. PT has had frequent UTI's and was seen this past week at Dr Luvenia Starch office for abdominal pain. Daughter reports pt has a gallstone and a kidney stone. Per GI note they want to discuss with POA (daughter) if they should proceed with EDG. They also note a change in her ability to communicate in the past month from a prior office visit.   PCP Dr. Felecia Shelling  Patient is DO NOT RESUSCITATE with limited IV fluids or antibiotics, no feeding tube  Past Medical History  Diagnosis Date  . Chronic kidney disease     acute renal failure  . COPD (chronic obstructive pulmonary disease)   . Diabetes mellitus   . Depression   . DEMENTIA   . Hypertension   . Shortness of breath   . GERD (gastroesophageal reflux disease)   . Gallstones   . Diverticulosis   . Concern about mental  disorder without diagnosis    Past Surgical History  Procedure Laterality Date  . Abdominal hysterectomy    . Cystoscopy  2008    with stent placement  . Extracorporeal shock wave lithotripsy  03/13/2011    Procedure: EXTRACORPOREAL SHOCK WAVE LITHOTRIPSY (ESWL);  Surgeon: Ky Barban;  Location: [facility];  Service: Urology;  Laterality: Right;  Right Renal Calculus  . Cystoscopy with litholapaxy  03/28/2011    Procedure: CYSTOSCOPY WITH LITHOLAPAXY;  Surgeon: Ky Barban;  Location: [facility];  Service: Urology;  Laterality: Right;  . Cystoscopy w/ ureteral stent placement  03/28/2011    Procedure: CYSTOSCOPY WITH RETROGRADE PYELOGRAM/URETERAL STENT PLACEMENT;  Surgeon: Ky Barban;  Location: [facility];  Service: Urology;  Laterality: Right;  . Extracorporeal shock wave lithotripsy  09/25/2011    Procedure: EXTRACORPOREAL SHOCK WAVE LITHOTRIPSY (ESWL);  Surgeon: Ky Barban, MD;  Location: [facility];  Service: Urology;  Laterality: Right;  ESWL Right Renal Calculus  . Esophagogastroduodenoscopy  03/19/2005    NWG:NFAOZHY antral gastritis, need to rule out H pylori infection/Pyloric channel ulcer without stigmata of bleed/Bulbar ulcer with duodenitis without stigmata of bleed   Family History  Problem Relation Age of Onset  . Anesthesia problems Neg Hx   . Hypotension Neg Hx   . Malignant hyperthermia Neg Hx   . Pseudochol deficiency Neg Hx    History  Substance Use Topics  . Smoking  status: Former Smoker -- 0.50 packs/day for 30 years    Types: Cigarettes    Quit date: 03/25/2003  . Smokeless tobacco: Not on file  . Alcohol Use: No   Lives in nursing home  OB History   Grav Para Term Preterm Abortions TAB SAB Ect Mult Living                 Review of Systems  Unable to perform ROS: Dementia    Allergies  Review of patient's allergies indicates no known allergies.  Home Medications   Current Outpatient Rx  Name  Route  Sig  Dispense  Refill  .  acetaminophen (TYLENOL) 500 MG tablet   Oral   Take 500 mg by mouth 2 (two) times daily.         Marland Kitchen ALPRAZolam (XANAX) 0.5 MG tablet   Oral   Take 0.5 mg by mouth 2 (two) times daily.         Marland Kitchen aluminum & magnesium hydroxide (MAALOX) 225-200 MG/5ML suspension   Oral   Take 30 mLs by mouth every 4 (four) hours as needed. For indigestion         . amLODipine (NORVASC) 5 MG tablet   Oral   Take 5 mg by mouth every morning.          Marland Kitchen atorvastatin (LIPITOR) 10 MG tablet   Oral   Take 10 mg by mouth at bedtime.          . Calcium Carb-Cholecalciferol (CALCIUM 500 +D) 500-400 MG-UNIT TABS   Oral   Take 1 tablet by mouth 2 (two) times daily.           Marland Kitchen escitalopram (LEXAPRO) 10 MG tablet   Oral   Take 15 mg by mouth daily.         . ferrous fumarate (HEMOCYTE - 106 MG FE) 325 (106 FE) MG TABS   Oral   Take 1 tablet by mouth daily.          . fluticasone (FLONASE) 50 MCG/ACT nasal spray   Nasal   Place 2 sprays into the nose daily. allergies          . HYDROcodone-acetaminophen (NORCO/VICODIN) 5-325 MG per tablet   Oral   Take 1 tablet by mouth 2 (two) times daily as needed for moderate pain. pain         . hydrocortisone (ANUSOL-HC) 2.5 % rectal cream   Rectal   Place 1 application rectally daily as needed. For irritation and hemorrhoids         . loperamide (IMODIUM) 2 MG capsule   Oral   Take 4 mg by mouth daily as needed. Takes 2 capsules after 1st loose stool. Then take 1 tab after each loose stool. Do not exceed 16 grams/24 hour period         . oxybutynin (DITROPAN-XL) 10 MG 24 hr tablet   Oral   Take 10 mg by mouth every morning.          . pantoprazole (PROTONIX) 40 MG tablet   Oral   Take 1 tablet (40 mg total) by mouth daily before breakfast.   30 tablet   3   . senna (SENOKOT) 8.6 MG TABS   Oral   Take 3 tablets by mouth every morning.          . vitamin B-12 (CYANOCOBALAMIN) 1000 MCG tablet   Oral   Take 1,000 mcg by  mouth every morning.  BP 111/50  Resp 15  Ht 5\' 5"  (1.651 m)  Wt 140 lb (63.504 kg)  BMI 23.30 kg/m2  SpO2 95%  Vital signs normal except bradycardia   Physical Exam  Nursing note and vitals reviewed. Constitutional: She is oriented to person, place, and time. She appears well-developed and well-nourished.  Non-toxic appearance. She does not appear ill. No distress.  HENT:  Head: Normocephalic and atraumatic.  Right Ear: External ear normal.  Left Ear: External ear normal.  Nose: Nose normal. No mucosal edema or rhinorrhea.  Mouth/Throat: Oropharynx is clear and moist and mucous membranes are normal. No dental abscesses or uvula swelling.  Eyes: Conjunctivae and EOM are normal. Pupils are equal, round, and reactive to light.  Neck: Normal range of motion and full passive range of motion without pain. Neck supple.  Cardiovascular: Normal rate, regular rhythm and normal heart sounds.  Exam reveals no gallop and no friction rub.   No murmur heard. Pulmonary/Chest: Effort normal and breath sounds normal. No respiratory distress. She has no wheezes. She has no rhonchi. She has no rales. She exhibits no tenderness and no crepitus.  Abdominal: Soft. Normal appearance and bowel sounds are normal. She exhibits no distension. There is no tenderness. There is no rebound and no guarding.  Musculoskeletal: Normal range of motion. She exhibits no edema and no tenderness.  Moves all extremities well.   Neurological: She is alert and oriented to person, place, and time. She has normal strength. No cranial nerve deficit.  Patient is noted to have drooping of her right face. She is not very cooperative for neuro exam however she was observed to be using her right arm normally.  Skin: Skin is warm, dry and intact. No rash noted. No erythema. No pallor.  Psychiatric: She has a normal mood and affect. Her mood appears not anxious. Her speech is slurred. Cognition and memory are impaired. She  is inattentive.    ED Course  Procedures (including critical care time)  Medications  0.9 %  sodium chloride infusion (not administered)  sodium chloride 0.9 % bolus 500 mL (not administered)    Pt given IV fluids for possible dehydration.   Pt left at change of shift at 22:00 with Dr Hyacinth Meeker to get results of her UA and CT head.   Labs Review Results for orders placed during the hospital encounter of 05/01/13  CBC WITH DIFFERENTIAL      Result Value Range   WBC 10.2  4.0 - 10.5 K/uL   RBC 3.85 (*) 3.87 - 5.11 MIL/uL   Hemoglobin 11.4 (*) 12.0 - 15.0 g/dL   HCT 14.7 (*) 82.9 - 56.2 %   MCV 91.9  78.0 - 100.0 fL   MCH 29.6  26.0 - 34.0 pg   MCHC 32.2  30.0 - 36.0 g/dL   RDW 13.0  86.5 - 78.4 %   Platelets 220  150 - 400 K/uL   Neutrophils Relative % 78 (*) 43 - 77 %   Neutro Abs 8.0 (*) 1.7 - 7.7 K/uL   Lymphocytes Relative 13  12 - 46 %   Lymphs Abs 1.4  0.7 - 4.0 K/uL   Monocytes Relative 5  3 - 12 %   Monocytes Absolute 0.5  0.1 - 1.0 K/uL   Eosinophils Relative 3  0 - 5 %   Eosinophils Absolute 0.3  0.0 - 0.7 K/uL   Basophils Relative 0  0 - 1 %   Basophils Absolute 0.0  0.0 -  0.1 K/uL  COMPREHENSIVE METABOLIC PANEL      Result Value Range   Sodium 145  135 - 145 mEq/L   Potassium 4.4  3.5 - 5.1 mEq/L   Chloride 106  96 - 112 mEq/L   CO2 26  19 - 32 mEq/L   Glucose, Bld 130 (*) 70 - 99 mg/dL   BUN 30 (*) 6 - 23 mg/dL   Creatinine, Ser 3.47 (*) 0.50 - 1.10 mg/dL   Calcium 9.9  8.4 - 42.5 mg/dL   Total Protein 7.5  6.0 - 8.3 g/dL   Albumin 3.6  3.5 - 5.2 g/dL   AST 20  0 - 37 U/L   ALT 11  0 - 35 U/L   Alkaline Phosphatase 108  39 - 117 U/L   Total Bilirubin 0.5  0.3 - 1.2 mg/dL   GFR calc non Af Amer 28 (*) >90 mL/min   GFR calc Af Amer 32 (*) >90 mL/min  LIPASE, BLOOD      Result Value Range   Lipase 52  11 - 59 U/L  TROPONIN I      Result Value Range   Troponin I <0.30  <0.30 ng/mL    Laboratory interpretation all normal except mild anemia, stable  renal insufficiency, but has more elevated BUN c/w dehydration than her baseline   Imaging Review Dg Chest Portable 1 View  05/01/2013   CLINICAL DATA:  Altered mental status.  Weakness.  EXAM: PORTABLE CHEST - 1 VIEW  COMPARISON:  07/22/2012.  FINDINGS: The cardiac silhouette remains borderline enlarged and the aorta remains tortuous. Clear lungs. Thoracic spine degenerative changes. Diffuse osteopenia.  IMPRESSION: No acute abnormality.   Electronically Signed   By: Gordan Payment M.D.   On: 05/01/2013 21:45     Dg Pelvis 1-2 Views  04/04/2013 .  IMPRESSION: 1. No evidence of fracture or dislocation. 2. Scattered vascular calcifications seen.   Electronically Signed   By: Roanna Raider M.D.   On: 04/04/2013 22:05   Ct Head Wo Contrast  04/04/2013    IMPRESSION: No acute abnormality. Stable atrophy and chronic small vessel white matter ischemic changes.   Electronically Signed   By: Gordan Payment M.D.   On: 04/04/2013 22:25   EKG Interpretation   None       Date: 05/01/2013  Rate: 57  Rhythm: Wenkebach rhythm  QRS Axis: normal  Intervals: normal  ST/T Wave abnormalities: normal  Conduction Disutrbances:none  Narrative Interpretation:   Old EKG Reviewed: unchanged From Feb, 2014    MDM   1. Altered mental status   2. Dementia   3. Wenckebach second degree AV block     Disposition pending   Devoria Albe, MD, Franz Dell, MD 05/01/13 2156

## 2013-05-01 NOTE — ED Notes (Signed)
Patient lying in bed with eyes closed; family remains at bedside.

## 2013-05-01 NOTE — ED Notes (Signed)
Patient to ED via [facility] from [facility].  Per nursing staff at [facility], patient was given Xanax around 1530 due to increased agitation.  Staff was feeding patient around 1830 and patient was pocketing her food.  Patient was then noted to have drooling from right side of mouth with right sided weakness and slurred speech.

## 2013-05-02 DIAGNOSIS — R4182 Altered mental status, unspecified: Secondary | ICD-10-CM | POA: Diagnosis not present

## 2013-05-02 LAB — URINE MICROSCOPIC-ADD ON

## 2013-05-02 LAB — URINALYSIS, ROUTINE W REFLEX MICROSCOPIC
Bilirubin Urine: NEGATIVE
Glucose, UA: NEGATIVE mg/dL
Hgb urine dipstick: NEGATIVE
Ketones, ur: NEGATIVE mg/dL
pH: 8.5 — ABNORMAL HIGH (ref 5.0–8.0)

## 2013-05-02 NOTE — ED Notes (Signed)
Patient pulled out IV, bloody gown and sheets changed.  Patient washed.  Patient became very agitated.

## 2013-05-02 NOTE — ED Provider Notes (Signed)
Patient reexamined, she is somewhat interactive, family states that she has been this way for 3 weeks, they are concerned for a new stroke, CT scan does not show any acute stroke or subacute stroke, urinalysis shows ongoing urinary infection with which she is arty being treated with antibiotics intramuscular at her nursing facility, urine culture sent, patient's vital signs appear stable, radiologic abnormalities with Wenke block phenomenon is not new and she is not a candidate for pacemaker placement. At this time the patient will be discharged, the family is in agreement with the plan.  Vida Roller, MD 05/02/13 [phone]

## 2013-05-07 LAB — URINE CULTURE: Colony Count: >=100000

## 2013-05-08 NOTE — ED Notes (Signed)
Post ED Visit - Positive Culture Follow-up: Successful Patient Follow-Up  Culture assessed and recommendations reviewed by: []  Wes Kathryne Eriksson, Pharm.D., BCPS []  Celedonio Miyamoto, Pharm.D., BCPS []  Georgina Pillion, Pharm.D., BCPS []  [place], [address].D., BCPS, AAHIVP []  Estella Husk, Pharm.D., BCPS, AAHIVP [x]  Louie Casa, [address].D., BCPS  Positive urine culture  [x]  Patient discharged without antimicrobial prescription and treatment is now indicated []  Organism is resistant to prescribed ED discharge antimicrobial []  Patient with positive blood cultures  Changes discussed with ED provider: Fayrene Helper PA-C New antibiotic prescription: Linezolid 600 mg PO x 7 days    Kylie A Holland 05/08/2013, 3:13 PM

## 2013-05-09 NOTE — ED Notes (Signed)
Spoke with Raphael Gibney at [facility] and faxed results to [phone].

## 2013-05-17 NOTE — Telephone Encounter (Signed)
Please touch base with POA and see if they want to pursue EGD for abdominal pain, diminished appetite. I will be glad to speak with them if you are able to get them on the phone.   If they want to pursue EGD, we will need to have the patient come back in for OV since it has been since 04/16/13 since she was here. It would be very good if POA could come to visit with her.

## 2013-05-18 NOTE — Telephone Encounter (Signed)
Tried to call POA- LMOM

## 2013-05-18 NOTE — Telephone Encounter (Signed)
Spoke with POA- Hulan Fess- she stated the nursing home did not inform her that they were bringing the pt to our office for an ov and that is why she didn't come. She said the pt has been in the hospital twice since she has been here and had some significant behavioral changes, which have gotten better now. She is declining egd right now. She doesn't want to schedule ov for egd but will think about it over the holidays and will call and schedule ov once she thinks about it some more. She said if LSL still needs to talk with her that she can call her anytime.

## 2013-07-27 ENCOUNTER — Ambulatory Visit (INDEPENDENT_AMBULATORY_CARE_PROVIDER_SITE_OTHER): Payer: PRIVATE HEALTH INSURANCE | Admitting: Urology

## 2013-07-27 DIAGNOSIS — N200 Calculus of kidney: Secondary | ICD-10-CM

## 2013-07-27 DIAGNOSIS — R82998 Other abnormal findings in urine: Secondary | ICD-10-CM

## 2013-09-17 ENCOUNTER — Emergency Department (HOSPITAL_COMMUNITY): Payer: PRIVATE HEALTH INSURANCE

## 2013-09-17 ENCOUNTER — Inpatient Hospital Stay (HOSPITAL_COMMUNITY)
Admission: EM | Admit: 2013-09-17 | Discharge: 2013-09-21 | DRG: 391 | Disposition: A | Discharge status: Skilled Nursing Facility | Payer: PRIVATE HEALTH INSURANCE | Attending: Internal Medicine | Admitting: Internal Medicine

## 2013-09-17 ENCOUNTER — Encounter (HOSPITAL_COMMUNITY): Payer: Self-pay | Admitting: Emergency Medicine

## 2013-09-17 DIAGNOSIS — F329 Major depressive disorder, single episode, unspecified: Secondary | ICD-10-CM | POA: Diagnosis present

## 2013-09-17 DIAGNOSIS — F02818 Dementia in other diseases classified elsewhere, unspecified severity, with other behavioral disturbance: Secondary | ICD-10-CM | POA: Diagnosis present

## 2013-09-17 DIAGNOSIS — F3289 Other specified depressive episodes: Secondary | ICD-10-CM | POA: Diagnosis present

## 2013-09-17 DIAGNOSIS — Z79899 Other long term (current) drug therapy: Secondary | ICD-10-CM | POA: Diagnosis not present

## 2013-09-17 DIAGNOSIS — F411 Generalized anxiety disorder: Secondary | ICD-10-CM | POA: Diagnosis present

## 2013-09-17 DIAGNOSIS — G8929 Other chronic pain: Secondary | ICD-10-CM

## 2013-09-17 DIAGNOSIS — I129 Hypertensive chronic kidney disease with stage 1 through stage 4 chronic kidney disease, or unspecified chronic kidney disease: Secondary | ICD-10-CM | POA: Diagnosis present

## 2013-09-17 DIAGNOSIS — G309 Alzheimer's disease, unspecified: Secondary | ICD-10-CM | POA: Diagnosis present

## 2013-09-17 DIAGNOSIS — E119 Type 2 diabetes mellitus without complications: Secondary | ICD-10-CM | POA: Diagnosis present

## 2013-09-17 DIAGNOSIS — F03918 Unspecified dementia, unspecified severity, with other behavioral disturbance: Secondary | ICD-10-CM | POA: Diagnosis present

## 2013-09-17 DIAGNOSIS — K2960 Other gastritis without bleeding: Secondary | ICD-10-CM | POA: Diagnosis present

## 2013-09-17 DIAGNOSIS — F0280 Dementia in other diseases classified elsewhere without behavioral disturbance: Secondary | ICD-10-CM | POA: Diagnosis present

## 2013-09-17 DIAGNOSIS — J449 Chronic obstructive pulmonary disease, unspecified: Secondary | ICD-10-CM | POA: Diagnosis present

## 2013-09-17 DIAGNOSIS — K222 Esophageal obstruction: Secondary | ICD-10-CM | POA: Diagnosis present

## 2013-09-17 DIAGNOSIS — R109 Unspecified abdominal pain: Secondary | ICD-10-CM | POA: Diagnosis present

## 2013-09-17 DIAGNOSIS — D131 Benign neoplasm of stomach: Secondary | ICD-10-CM | POA: Diagnosis present

## 2013-09-17 DIAGNOSIS — E785 Hyperlipidemia, unspecified: Secondary | ICD-10-CM | POA: Diagnosis present

## 2013-09-17 DIAGNOSIS — J189 Pneumonia, unspecified organism: Secondary | ICD-10-CM | POA: Diagnosis present

## 2013-09-17 DIAGNOSIS — Z87891 Personal history of nicotine dependence: Secondary | ICD-10-CM

## 2013-09-17 DIAGNOSIS — Z66 Do not resuscitate: Secondary | ICD-10-CM | POA: Diagnosis present

## 2013-09-17 DIAGNOSIS — F0391 Unspecified dementia with behavioral disturbance: Secondary | ICD-10-CM

## 2013-09-17 DIAGNOSIS — K219 Gastro-esophageal reflux disease without esophagitis: Secondary | ICD-10-CM | POA: Diagnosis present

## 2013-09-17 DIAGNOSIS — F0281 Dementia in other diseases classified elsewhere with behavioral disturbance: Secondary | ICD-10-CM | POA: Diagnosis present

## 2013-09-17 DIAGNOSIS — N189 Chronic kidney disease, unspecified: Secondary | ICD-10-CM | POA: Diagnosis present

## 2013-09-17 DIAGNOSIS — R634 Abnormal weight loss: Secondary | ICD-10-CM

## 2013-09-17 DIAGNOSIS — E860 Dehydration: Secondary | ICD-10-CM | POA: Diagnosis present

## 2013-09-17 DIAGNOSIS — J4489 Other specified chronic obstructive pulmonary disease: Secondary | ICD-10-CM | POA: Diagnosis present

## 2013-09-17 DIAGNOSIS — R1084 Generalized abdominal pain: Secondary | ICD-10-CM

## 2013-09-17 HISTORY — DX: Dehydration: E86.0

## 2013-09-17 HISTORY — DX: Anemia, unspecified: D64.9

## 2013-09-17 HISTORY — DX: Hyperlipidemia, unspecified: E78.5

## 2013-09-17 HISTORY — DX: Anxiety disorder, unspecified: F41.9

## 2013-09-17 HISTORY — DX: Dysphagia, unspecified: R13.10

## 2013-09-17 HISTORY — DX: Unspecified dementia, unspecified severity, without behavioral disturbance, psychotic disturbance, mood disturbance, and anxiety: F03.90

## 2013-09-17 HISTORY — DX: Calculus of kidney: N20.0

## 2013-09-17 HISTORY — DX: Calculus of gallbladder without cholecystitis without obstruction: K80.20

## 2013-09-17 HISTORY — DX: Hypokalemia: E87.6

## 2013-09-17 LAB — COMPREHENSIVE METABOLIC PANEL
ALK PHOS: 106 U/L (ref 39–117)
ALT: 8 U/L (ref 0–35)
AST: 14 U/L (ref 0–37)
Albumin: 3.1 g/dL — ABNORMAL LOW (ref 3.5–5.2)
BILIRUBIN TOTAL: 0.3 mg/dL (ref 0.3–1.2)
BUN: 30 mg/dL — ABNORMAL HIGH (ref 6–23)
CALCIUM: 9.5 mg/dL (ref 8.4–10.5)
CHLORIDE: 98 meq/L (ref 96–112)
CO2: 26 meq/L (ref 19–32)
Creatinine, Ser: 1.82 mg/dL — ABNORMAL HIGH (ref 0.50–1.10)
GFR, EST AFRICAN AMERICAN: 29 mL/min — AB (ref >90)
GFR, EST NON AFRICAN AMERICAN: 25 mL/min — AB (ref >90)
GLUCOSE: 134 mg/dL — AB (ref 70–99)
POTASSIUM: 4.5 meq/L (ref 3.7–5.3)
SODIUM: 137 meq/L (ref 137–147)
Total Protein: 7.7 g/dL (ref 6.0–8.3)

## 2013-09-17 LAB — CBC WITH DIFFERENTIAL/PLATELET
Basophils Absolute: 0 10*3/uL (ref 0.0–0.1)
Basophils Relative: 0 % (ref 0–1)
EOS PCT: 1 % (ref 0–5)
Eosinophils Absolute: 0.1 10*3/uL (ref 0.0–0.7)
HCT: 30.9 % — ABNORMAL LOW (ref 36.0–46.0)
Hemoglobin: 10.3 g/dL — ABNORMAL LOW (ref 12.0–15.0)
LYMPHS ABS: 1.2 10*3/uL (ref 0.7–4.0)
LYMPHS PCT: 10 % — AB (ref 12–46)
MCH: 29.6 pg (ref 26.0–34.0)
MCHC: 33.3 g/dL (ref 30.0–36.0)
MCV: 88.8 fL (ref 78.0–100.0)
Monocytes Absolute: 1.1 10*3/uL — ABNORMAL HIGH (ref 0.1–1.0)
Monocytes Relative: 9 % (ref 3–12)
NEUTROS ABS: 9.3 10*3/uL — AB (ref 1.7–7.7)
Neutrophils Relative %: 80 % — ABNORMAL HIGH (ref 43–77)
PLATELETS: 304 10*3/uL (ref 150–400)
RBC: 3.48 MIL/uL — ABNORMAL LOW (ref 3.87–5.11)
RDW: 13 % (ref 11.5–15.5)
WBC: 11.7 10*3/uL — AB (ref 4.0–10.5)

## 2013-09-17 LAB — LIPASE, BLOOD: Lipase: 36 U/L (ref 11–59)

## 2013-09-17 LAB — TROPONIN I: Troponin I: <0.3 ng/mL (ref <0.30)

## 2013-09-17 MED ORDER — SODIUM CHLORIDE 0.9 % IV SOLN: 1000.0000 mL | INTRAVENOUS | Status: DC

## 2013-09-17 MED ORDER — LEVOFLOXACIN IN D5W 500 MG/100ML IV SOLN
500.0000 mg | Freq: Once | INTRAVENOUS | Status: AC
  Administered 2013-09-17: 500 mg via INTRAVENOUS
  Filled 2013-09-17: qty 100

## 2013-09-17 MED ORDER — SODIUM CHLORIDE 0.9 % IV SOLN
1000.0000 mL | Freq: Once | INTRAVENOUS | Status: AC
  Administered 2013-09-17: 1000 mL via INTRAVENOUS

## 2013-09-17 NOTE — H&P (Signed)
[facility] History and Physical  [name] [name] MRN:[id number] DOB: 08/11/1928 DOA: 09/17/2013   PCP: FANTA,TESFAYE, MD  Specialists: She has been followed by Dr. Jenkins and Gastroenterology in the past for abdominal pain  Chief Complaint: Abdominal pain  HPI: [name] [name] is a 80 y.o. female with a past medical history of advanced Alzheimer's dementia, COPD, recent UTI, hypertension, who lives in a skilled nursing facility. She is accompanied by her son and daughter. However, they are not aware of the details regarding her presentation. It appears that the patient has been complaining of abdominal, back, shoulder, and chest pain for the last few days. Patient is confused due to her dementia and was unable to provide any history. Her mental status appears to be at baseline according to her daughter. There's been no history of nausea, vomiting. No history of any cough currently but was noted to be coughing on Monday. In the ED, she was noted to have a low-grade temperature.  Home Medications: Prior to Admission medications   Medication Sig Start Date End Date Taking? Authorizing Provider  acetaminophen (TYLENOL) 500 MG tablet Take 500 mg by mouth 2 (two) times daily.   Yes Historical Provider, MD  ALPRAZolam (XANAX) 0.5 MG tablet Take 0.5 mg by mouth every 6 (six) hours as needed for anxiety.    Yes Historical Provider, MD  amLODipine (NORVASC) 5 MG tablet Take 5 mg by mouth every morning.    Yes Historical Provider, MD  atorvastatin (LIPITOR) 10 MG tablet Take 10 mg by mouth at bedtime.    Yes Historical Provider, MD  Calcium Carbonate-Vitamin D (CALCARB 600/D) 600-400 MG-UNIT per tablet Take 1 tablet by mouth 2 (two) times daily.   Yes Historical Provider, MD  cephALEXin (KEFLEX) 500 MG capsule Take 500 mg by mouth 2 (two) times daily. 7 day course starting on 09/09/2013   Yes Historical Provider, MD  escitalopram (LEXAPRO) 5 MG tablet Take 15 mg by mouth daily.    Yes Historical  Provider, MD  ferrous fumarate (HEMOCYTE - 106 MG FE) 325 (106 FE) MG TABS Take 1 tablet by mouth daily.    Yes Historical Provider, MD  fluticasone (FLONASE) 50 MCG/ACT nasal spray Place 2 sprays into the nose daily. allergies    Yes Historical Provider, MD  haloperidol lactate (HALDOL) 5 MG/ML injection Inject 1.5 mg into the muscle every 8 (eight) hours as needed (for agitation).   Yes Historical Provider, MD  HYDROcodone-acetaminophen (NORCO/VICODIN) 5-325 MG per tablet Take 1 tablet by mouth every 6 (six) hours as needed for moderate pain. pain   Yes Historical Provider, MD  ondansetron (ZOFRAN) 4 MG tablet Take 4 mg by mouth every 8 (eight) hours as needed for nausea or vomiting.   Yes Historical Provider, MD  pantoprazole (PROTONIX) 40 MG tablet Take 1 tablet (40 mg total) by mouth daily before breakfast. 04/16/13  Yes Leslie S Lewis, PA-C  QUEtiapine (SEROQUEL) 25 MG tablet Take 12.5-25 mg by mouth 2 (two) times daily. Takes one-half tablet at 10am and one tablet at bedtime   Yes Historical Provider, MD  QUEtiapine (SEROQUEL) 50 MG tablet Take 50 mg by mouth 3 (three) times daily.   Yes Historical Provider, MD  senna (SENOKOT) 8.6 MG TABS Take 3 tablets by mouth every morning.    Yes Historical Provider, MD  vitamin B-12 (CYANOCOBALAMIN) 1000 MCG tablet Take 1,000 mcg by mouth every morning.    Yes Historical Provider, MD    Allergies: No Known Allergies    Past Medical History: Past Medical History  Diagnosis Date  . Chronic kidney disease     acute renal failure  . COPD (chronic obstructive pulmonary disease)   . Diabetes mellitus   . Depression   . DEMENTIA   . Hypertension   . Shortness of breath   . GERD (gastroesophageal reflux disease)   . Gallstones   . Diverticulosis   . Concern about mental disorder without diagnosis   . Dehydration   . Anxiety   . Dysphagia   . Anemia   . Cholelithiases   . Renal stones   . Hyperlipidemia   . Hypokalemia   . Dementia     Past  Surgical History  Procedure Laterality Date  . Abdominal hysterectomy    . Cystoscopy  2008    with stent placement  . Extracorporeal shock wave lithotripsy  03/13/2011    Procedure: EXTRACORPOREAL SHOCK WAVE LITHOTRIPSY (ESWL);  Surgeon: Marissa Nestle;  Location: [facility];  Service: Urology;  Laterality: Right;  Right Renal Calculus  . Cystoscopy with litholapaxy  03/28/2011    Procedure: CYSTOSCOPY WITH LITHOLAPAXY;  Surgeon: Marissa Nestle;  Location: [facility];  Service: Urology;  Laterality: Right;  . Cystoscopy w/ ureteral stent placement  03/28/2011    Procedure: CYSTOSCOPY WITH RETROGRADE PYELOGRAM/URETERAL STENT PLACEMENT;  Surgeon: Marissa Nestle;  Location: [facility];  Service: Urology;  Laterality: Right;  . Extracorporeal shock wave lithotripsy  09/25/2011    Procedure: EXTRACORPOREAL SHOCK WAVE LITHOTRIPSY (ESWL);  Surgeon: Marissa Nestle, MD;  Location: [facility];  Service: Urology;  Laterality: Right;  ESWL Right Renal Calculus  . Esophagogastroduodenoscopy  03/19/2005    GBM:[id number] antral gastritis, need to rule out H pylori infection/Pyloric channel ulcer without stigmata of bleed/Bulbar ulcer with duodenitis without stigmata of bleed    Social History: She lives in a skilled nursing facility. Rest of the history is unavailable at this time. She gets around a wheelchair  Family History:  Family History  Problem Relation Age of Onset  . Anesthesia problems Neg Hx   . Hypotension Neg Hx   . Malignant hyperthermia Neg Hx   . Pseudochol deficiency Neg Hx      Review of Systems - unable to do due to her dementia  Physical Examination  Filed Vitals:   09/17/13 2000 09/17/13 2100 09/17/13 2216 09/17/13 2341  BP: 110/50 91/57 114/51 116/48  Pulse: 66 66 66 65  Temp:   100 F (37.8 C)   TempSrc:   Rectal   Resp: _0 Height:      Weight:      SpO2: 95% 94% 95% 94%    BP 116/48  Pulse 65  Temp(Src) 100 F (37.8 C) (Rectal)  Resp 20  Ht 5' 4" (1.626 m)   Wt 63.504 kg (140 lb)  BMI 24.02 kg/m2  SpO2 94%  General appearance: appears stated age, distracted and no distress Head: Normocephalic, without obvious abnormality, atraumatic Eyes: conjunctivae/corneas clear. PERRL, EOM's intact. Throat: dry mm Neck: no adenopathy, no carotid bruit, no JVD, supple, symmetrical, trachea midline and thyroid not enlarged, symmetric, no tenderness/mass/nodules Resp: Decreased air entry at the bases without any crackles or wheezing. Cardio: regular rate and rhythm, S1, S2 normal, no murmur, click, rub or gallop GI: Abdomen was soft for the most part. Examination was unreliable. There was some tenderness elicited with palpation. However, at other times she would not complain of any pain. No rebound, rigidity, or guarding. No masses,  organomegaly. Bowel sounds were present. Extremities: extremities normal, atraumatic, no cyanosis or edema Skin: Skin color, texture, turgor normal. No rashes or lesions Lymph nodes: Cervical, supraclavicular, and axillary nodes normal. Neurologic: She is alert, arousable. Disoriented. No focal deficits  Laboratory Data: Results for orders placed during the hospital encounter of 09/17/13 (from the past 48 hour(s))  CBC WITH DIFFERENTIAL     Status: Abnormal   Collection Time    09/17/13  7:06 PM      Result Value Ref Range   WBC 11.7 (*) 4.0 - 10.5 K/uL   RBC 3.48 (*) 3.87 - 5.11 MIL/uL   Hemoglobin 10.3 (*) 12.0 - 15.0 g/dL   HCT 30.9 (*) 36.0 - 46.0 %   MCV 88.8  78.0 - 100.0 fL   MCH 29.6  26.0 - 34.0 pg   MCHC 33.3  30.0 - 36.0 g/dL   RDW 13.0  11.5 - 15.5 %   Platelets 304  150 - 400 K/uL   Neutrophils Relative % 80 (*) 43 - 77 %   Neutro Abs 9.3 (*) 1.7 - 7.7 K/uL   Lymphocytes Relative 10 (*) 12 - 46 %   Lymphs Abs 1.2  0.7 - 4.0 K/uL   Monocytes Relative 9  3 - 12 %   Monocytes Absolute 1.1 (*) 0.1 - 1.0 K/uL   Eosinophils Relative 1  0 - 5 %   Eosinophils Absolute 0.1  0.0 - 0.7 K/uL   Basophils Relative 0  0  - 1 %   Basophils Absolute 0.0  0.0 - 0.1 K/uL  COMPREHENSIVE METABOLIC PANEL     Status: Abnormal   Collection Time    09/17/13  7:06 PM      Result Value Ref Range   Sodium 137  137 - 147 mEq/L   Potassium 4.5  3.7 - 5.3 mEq/L   Chloride 98  96 - 112 mEq/L   CO2 26  19 - 32 mEq/L   Glucose, Bld 134 (*) 70 - 99 mg/dL   BUN 30 (*) 6 - 23 mg/dL   Creatinine, Ser 1.82 (*) 0.50 - 1.10 mg/dL   Calcium 9.5  8.4 - 10.5 mg/dL   Total Protein 7.7  6.0 - 8.3 g/dL   Albumin 3.1 (*) 3.5 - 5.2 g/dL   AST 14  0 - 37 U/L   ALT 8  0 - 35 U/L   Alkaline Phosphatase 106  39 - 117 U/L   Total Bilirubin 0.3  0.3 - 1.2 mg/dL   GFR calc non Af Amer 25 (*) >90 mL/min   GFR calc Af Amer 29 (*) >90 mL/min   Comment: (NOTE)     The eGFR has been calculated using the CKD EPI equation.     This calculation has not been validated in all clinical situations.     eGFR's persistently <90 mL/min signify possible Chronic Kidney     Disease.  LIPASE, BLOOD     Status: None   Collection Time    09/17/13  7:06 PM      Result Value Ref Range   Lipase 36  11 - 59 U/L  TROPONIN I     Status: None   Collection Time    09/17/13  7:06 PM      Result Value Ref Range   Troponin I <0.30  <0.30 ng/mL   Comment:            Due to the release kinetics of cTnI,       a negative result within the first hours     of the onset of symptoms does not rule out     myocardial infarction with certainty.     If myocardial infarction is still suspected,     repeat the test at appropriate intervals.    Radiology Reports: Ct Abdomen Pelvis Wo Contrast  09/17/2013   CLINICAL DATA:  Right flank pain, known kidney stone, history anxiety, dementia, acute renal failure, COPD, diabetes, hypertension  EXAM: CT ABDOMEN AND PELVIS WITHOUT CONTRAST  TECHNIQUE: Multidetector CT imaging of the abdomen and pelvis was performed following the standard protocol without intravenous contrast.  COMPARISON:  03/03/2013  FINDINGS: Small left pleural  effusion and associated left lower lobe atelectasis.  Scattered atherosclerotic calcifications aorta and coronary arteries with minimal pericardial fluid.  Dependent 12 mm diameter gallstone in gallbladder.  IVC filter.  Nonobstructing calculus at inferior pole right kidney 9 mm diameter image 44.  Minimal dilatation of the right renal collecting system and proximal right ureter without identification of a ureteral calculus.  Within limits of a nonenhanced exam, no focal abnormalities of the liver, spleen, pancreas, left kidney, or adrenal glands.  Sigmoid diverticulosis without evidence of diverticulitis.  Stomach and bowel loops otherwise unremarkable for technique.  Uterus surgically absent with nonvisualization of the ovaries and appendix.  Low descent of ,bladder base in pelvis likely representing a cystocele.  No mass, adenopathy, free fluid or inflammatory process.  Bones demineralized with scattered degenerative disc disease changes of the thoracolumbar spine.  IMPRESSION: Nonobstructing 9 mm calculus at lower pole right kidney.  Minimal dilatation of the right renal collecting system and proximal right ureter though no ureteral calculus is evident.  Probable cystocele.  Sigmoid diverticulosis without evidence of diverticulitis.  Cholelithiasis.  Small left pleural effusion and basilar atelectasis.   Electronically Signed   By: Mark  Boles M.D.   On: 09/17/2013 20:37   Dg Chest Portable 1 View  09/17/2013   CLINICAL DATA:  Chest pain.  EXAM: PORTABLE CHEST - 1 VIEW  COMPARISON:  05/01/2013  FINDINGS: The patient is mildly rotated to the right. Cardiomediastinal silhouette is grossly unchanged. Lungs remain hypoinflated. There is new patchy opacity in the left lung base with partial obscuration of the left hemidiaphragm. Right lung remains grossly clear. No definite pleural effusion or pneumothorax is identified. No acute osseous abnormality is seen. Multilevel osteophytosis is present in the thoracic  spine.  IMPRESSION: New infiltrate in the left lung base could reflect pneumonia.   Electronically Signed   By: Allen  Grady   On: 09/17/2013 18:38    Electrocardiogram: Sinus rhythm with first degree AV block. Normal axis. No concerning ST or T-wave changes. No Q waves.  Problem List  Principal Problem:   Abdominal pain Active Problems:   Dementia with behavioral disturbance   Dehydration   Assessment: This is a 80-year-old, Caucasian female, was brought in for abdominal pain. Apparently this has been ongoing for a long time. There is also mention, that she may be having other pains as well including back, shoulder and is a mention of chest pain. The symptoms for the most part appear to be chronic. She hs seen gastroenterology and surgery in the past. EGD was offered but has not been done for reasons that are not clear. She was not thought to be a surgical candidate for cholecystectomy as it was not clear that if gallstones were responsible for her symptoms. Her dementia makes it very difficult to ascertain the reason   for her symptoms.  Plan: #1 abdominal pain, and back pain: CT of the abdomen, pelvis, does not give a clear reason for her symptoms. In and out catheterization did not reveal any urine in her urinary bladder. She was recently treated with Keflex for UTI. She does have gallstone, but LFTs are normal. Etiology for her abdominal pain is unclear. We will get an ultrasound of her gallbladder tomorrow morning. Check UA when urine is available. If these tests are normal I have explained to the family that there may not be too many other options left. It remains unclear if the patient is a good candidate for EGD, or not. I will defer this to Dr. Legrand Rams to decide. Continue PPI.  #2 low-grade fever with abnormal chest x-ray: Chest x-ray raised the possibility of a left lower lobe infiltrate. CT suggested tt was atelectasis. Since she does have a low-grade fever we will place her on vancomycin  and cefepime for now. Follow her clinically.  #3 dehydration: She'll be given IV fluids. Foley catheter will be placed to monitor her urine output and get a urine sample.  #4 history of dementia with behavioral disturbances: Continue with her home medications. Haldol as needed.  #5 history of chronic kidney disease: Creatinine is slightly higher than her baseline. Recheck in the morning after hydration.   DVT Prophylaxis: Heparin Code Status: DO NOT RESUSCITATE Family Communication: Discussed with the patient's daughter and son  Disposition Plan: Admit to MedSurg Dr. Legrand Rams to assume care in the morning   Further management decisions will depend on results of further testing and patient's response to treatment.   Bonnielee Haff  [facility] Pager [phone]  If 7PM-7AM, please contact night-coverage [email or web address] Password [facility]  09/17/2013, 11:53 PM

## 2013-09-17 NOTE — ED Provider Notes (Signed)
CSN: [id number]     Arrival date & time 09/17/13  1750 History  This chart was scribed for Ward Givens, MD by Dorothey Baseman, ED Scribe. This patient was seen in room [facility]17[facility]17 and the patient's care was started at 7:41 PM.    Chief Complaint  Patient presents with  . Abdominal Pain   The history is provided by a relative, the nursing home and the patient (son and daughter). The history is limited by the condition of the patient. No language interpreter was used.   Level 5 Caveat- (Dementia)  HPI Comments: [name] [name] is a 80 y.o. Female with a history of dementia, GERD, cholelithiasis, diverticulosis, renal stones, chronic kidney disease who presents to the Emergency Department complaining of an intermittent pain to the generalized abdomen that her son states has been ongoing "for a while," but her daughter states that the patient began crying that the pain became too severe about 2 days ago. Nursing home staff reports that the patient's pain seems to be exacerbated after eating. Patient denies any complaints at this time. Her daughter reports that the patient last had pain medication about 6.5 hours ago (patient is prescribed 5-325 mg Vicodin). Her daughter reports that the patient followed up with Dr. Lovell Sheehan several months ago for similar complaints, but that he said surgery was not necessary at that time. Her daughter denies fever, vomiting, diarrhea. Patient has a surgical history of abdominal hysterectomy, cystoscopy x3 (with lithoplaxy x1 and uretal stent placement x1), extracorporeal shock wave lithotripsy x2, and esophagogastroduodenoscopy. Patient also has a history of COPD, DM, HTN, hyperlipidemia, hypokalemia, dysphagia, anemia.  Pt awakened and she states she feels fine.   PCP Dr Felecia Shelling  Past Medical History  Diagnosis Date  . Chronic kidney disease     acute renal failure  . COPD (chronic obstructive pulmonary disease)   . Diabetes mellitus   . Depression   . DEMENTIA   .  Hypertension   . Shortness of breath   . GERD (gastroesophageal reflux disease)   . Gallstones   . Diverticulosis   . Concern about mental disorder without diagnosis   . Dehydration   . Anxiety   . Dysphagia   . Anemia   . Cholelithiases   . Renal stones   . Hyperlipidemia   . Hypokalemia   . Dementia    Past Surgical History  Procedure Laterality Date  . Abdominal hysterectomy    . Cystoscopy  2008    with stent placement  . Extracorporeal shock wave lithotripsy  03/13/2011    Procedure: EXTRACORPOREAL SHOCK WAVE LITHOTRIPSY (ESWL);  Surgeon: Ky Barban;  Location: [facility];  Service: Urology;  Laterality: Right;  Right Renal Calculus  . Cystoscopy with litholapaxy  03/28/2011    Procedure: CYSTOSCOPY WITH LITHOLAPAXY;  Surgeon: Ky Barban;  Location: [facility];  Service: Urology;  Laterality: Right;  . Cystoscopy w/ ureteral stent placement  03/28/2011    Procedure: CYSTOSCOPY WITH RETROGRADE PYELOGRAM/URETERAL STENT PLACEMENT;  Surgeon: Ky Barban;  Location: [facility];  Service: Urology;  Laterality: Right;  . Extracorporeal shock wave lithotripsy  09/25/2011    Procedure: EXTRACORPOREAL SHOCK WAVE LITHOTRIPSY (ESWL);  Surgeon: Ky Barban, MD;  Location: [facility];  Service: Urology;  Laterality: Right;  ESWL Right Renal Calculus  . Esophagogastroduodenoscopy  03/19/2005    NVB:TYOMAYO antral gastritis, need to rule out H pylori infection/Pyloric channel ulcer without stigmata of bleed/Bulbar ulcer with duodenitis without stigmata of bleed  Family History  Problem Relation Age of Onset  . Anesthesia problems Neg Hx   . Hypotension Neg Hx   . Malignant hyperthermia Neg Hx   . Pseudochol deficiency Neg Hx    History  Substance Use Topics  . Smoking status: Former Smoker -- 0.50 packs/day for 30 years    Types: Cigarettes    Quit date: 03/25/2003  . Smokeless tobacco: Not on file  . Alcohol Use: No   Lives in NH  OB History   Grav Para Term Preterm  Abortions TAB SAB Ect Mult Living                 Review of Systems  Unable to perform ROS     Allergies  Review of patient's allergies indicates no known allergies.  Home Medications   Prior to Admission medications   Medication Sig Start Date End Date Taking? Authorizing Provider  acetaminophen (TYLENOL) 500 MG tablet Take 500 mg by mouth 2 (two) times daily.    Historical Provider, MD  ALPRAZolam Duanne Moron) 0.5 MG tablet Take 0.5 mg by mouth every 6 (six) hours as needed for anxiety.     Historical Provider, MD  aluminum & magnesium hydroxide (MAALOX) 225-200 MG/5ML suspension Take 30 mLs by mouth every 4 (four) hours as needed. For indigestion    Historical Provider, MD  amLODipine (NORVASC) 5 MG tablet Take 5 mg by mouth every morning.     Historical Provider, MD  atorvastatin (LIPITOR) 10 MG tablet Take 10 mg by mouth at bedtime.     Historical Provider, MD  Calcium Carb-Cholecalciferol (CALCIUM 500 +D) 500-400 MG-UNIT TABS Take 1 tablet by mouth 2 (two) times daily.      Historical Provider, MD  escitalopram (LEXAPRO) 5 MG tablet Take 5 mg by mouth daily.    Historical Provider, MD  ferrous fumarate (HEMOCYTE - 106 MG FE) 325 (106 FE) MG TABS Take 1 tablet by mouth daily.     Historical Provider, MD  fluticasone (FLONASE) 50 MCG/ACT nasal spray Place 2 sprays into the nose daily. allergies     Historical Provider, MD  HYDROcodone-acetaminophen (NORCO/VICODIN) 5-325 MG per tablet Take 1 tablet by mouth 2 (two) times daily as needed for moderate pain. pain    Historical Provider, MD  hydrocortisone (ANUSOL-HC) 2.5 % rectal cream Place 1 application rectally daily as needed. For irritation and hemorrhoids    Historical Provider, MD  loperamide (IMODIUM) 2 MG capsule Take 4 mg by mouth daily as needed. Takes 2 capsules after 1st loose stool. Then take 1 tab after each loose stool. Do not exceed 16 grams/24 hour period    Historical Provider, MD  oxybutynin (DITROPAN) 5 MG tablet Take 2.5  mg by mouth 2 (two) times daily.    Historical Provider, MD  pantoprazole (PROTONIX) 40 MG tablet Take 1 tablet (40 mg total) by mouth daily before breakfast. 04/16/13   Mahala Menghini, PA-C  risperiDONE (RISPERDAL) 0.25 MG tablet Take 0.25 mg by mouth 2 (two) times daily.    Historical Provider, MD  senna (SENOKOT) 8.6 MG TABS Take 3 tablets by mouth every morning.     Historical Provider, MD  vitamin B-12 (CYANOCOBALAMIN) 1000 MCG tablet Take 1,000 mcg by mouth every morning.     Historical Provider, MD   Triage Vitals: BP 124/51  Pulse 67  Temp(Src) 98.2 F (36.8 C) (Oral)  Resp 22  Ht 5\' 4"  (1.626 m)  Wt 140 lb (63.504 kg)  BMI 24.02 kg/m2  SpO2 98%  Vital signs normal    Physical Exam  Nursing note and vitals reviewed. Constitutional: She appears well-developed and well-nourished.  Non-toxic appearance. She does not appear ill. No distress.  HENT:  Head: Normocephalic and atraumatic.  Right Ear: External ear normal.  Left Ear: External ear normal.  Nose: Nose normal. No mucosal edema or rhinorrhea.  Mouth/Throat: Oropharynx is clear and moist and mucous membranes are normal. No dental abscesses or uvula swelling.  Eyes: Conjunctivae and EOM are normal. Pupils are equal, round, and reactive to light.  Neck: Normal range of motion and full passive range of motion without pain. Neck supple.  Cardiovascular: Normal rate, regular rhythm and normal heart sounds.  Exam reveals no gallop and no friction rub.   No murmur heard. Pulmonary/Chest: Effort normal and breath sounds normal. No respiratory distress. She has no wheezes. She has no rhonchi. She has no rales. She exhibits no tenderness and no crepitus.  Abdominal: Soft. Normal appearance and bowel sounds are normal. She exhibits no distension. There is no tenderness. There is no rebound and no guarding.  Musculoskeletal: Normal range of motion. She exhibits no edema and no tenderness.  Moves all extremities well.   Neurological:  She is alert. She has normal strength. No cranial nerve deficit.  Skin: Skin is warm, dry and intact. No rash noted. No erythema. No pallor.  Psychiatric: She has a normal mood and affect. Her speech is normal and behavior is normal. Her mood appears not anxious.    ED Course  Procedures (including critical care time)  Medications  0.9 %  sodium chloride infusion (1,000 mLs Intravenous New Bag/Given 09/17/13 2254)    Followed by  0.9 %  sodium chloride infusion (not administered)  levofloxacin (LEVAQUIN) IVPB 500 mg (0 mg Intravenous Stopped 09/17/13 2359)     DIAGNOSTIC STUDIES: Oxygen Saturation is 98% on room air, normal by my interpretation.    COORDINATION OF CARE: 6:15 PM- Ordered EKG, troponin, UA, CBC, CMP, lipase, and a chest x-ray.   7:49 PM- Discussed that preliminary lab results were normal. Ordered a CT of the abdomen. Discussed treatment plan with patient and family at bedside and family verbalized agreement on the patient's behalf.  Pt started on IV antibiotics when her chest xray was read as a possible pneumonia.   22:43 Dr Maryland Pink will look at patient and decide if she needs admission.   Results for orders placed during the hospital encounter of 09/17/13  CBC WITH DIFFERENTIAL      Result Value Ref Range   WBC 11.7 (*) 4.0 - 10.5 K/uL   RBC 3.48 (*) 3.87 - 5.11 MIL/uL   Hemoglobin 10.3 (*) 12.0 - 15.0 g/dL   HCT 30.9 (*) 36.0 - 46.0 %   MCV 88.8  78.0 - 100.0 fL   MCH 29.6  26.0 - 34.0 pg   MCHC 33.3  30.0 - 36.0 g/dL   RDW 13.0  11.5 - 15.5 %   Platelets 304  150 - 400 K/uL   Neutrophils Relative % 80 (*) 43 - 77 %   Neutro Abs 9.3 (*) 1.7 - 7.7 K/uL   Lymphocytes Relative 10 (*) 12 - 46 %   Lymphs Abs 1.2  0.7 - 4.0 K/uL   Monocytes Relative 9  3 - 12 %   Monocytes Absolute 1.1 (*) 0.1 - 1.0 K/uL   Eosinophils Relative 1  0 - 5 %   Eosinophils Absolute 0.1  0.0 - 0.7 K/uL  Basophils Relative 0  0 - 1 %   Basophils Absolute 0.0  0.0 - 0.1 K/uL   COMPREHENSIVE METABOLIC PANEL      Result Value Ref Range   Sodium 137  137 - 147 mEq/L   Potassium 4.5  3.7 - 5.3 mEq/L   Chloride 98  96 - 112 mEq/L   CO2 26  19 - 32 mEq/L   Glucose, Bld 134 (*) 70 - 99 mg/dL   BUN 30 (*) 6 - 23 mg/dL   Creatinine, Ser 1.82 (*) 0.50 - 1.10 mg/dL   Calcium 9.5  8.4 - 10.5 mg/dL   Total Protein 7.7  6.0 - 8.3 g/dL   Albumin 3.1 (*) 3.5 - 5.2 g/dL   AST 14  0 - 37 U/L   ALT 8  0 - 35 U/L   Alkaline Phosphatase 106  39 - 117 U/L   Total Bilirubin 0.3  0.3 - 1.2 mg/dL   GFR calc non Af Amer 25 (*) >90 mL/min   GFR calc Af Amer 29 (*) >90 mL/min  LIPASE, BLOOD      Result Value Ref Range   Lipase 36  11 - 59 U/L  TROPONIN I      Result Value Ref Range   Troponin I <0.30  <0.30 ng/mL   Laboratory interpretation all normal except leukocytosis, stable anemia, mild worsening of renal insufficiency   Dg Chest Portable 1 View  09/17/2013   CLINICAL DATA:  Chest pain.  EXAM: PORTABLE CHEST - 1 VIEW  COMPARISON:  05/01/2013  FINDINGS: The patient is mildly rotated to the right. Cardiomediastinal silhouette is grossly unchanged. Lungs remain hypoinflated. There is new patchy opacity in the left lung base with partial obscuration of the left hemidiaphragm. Right lung remains grossly clear. No definite pleural effusion or pneumothorax is identified. No acute osseous abnormality is seen. Multilevel osteophytosis is present in the thoracic spine.  IMPRESSION: New infiltrate in the left lung base could reflect pneumonia.   Electronically Signed   By: Logan Bores   On: 09/17/2013 18:38   Ct Abdomen Pelvis Wo Contrast  09/17/2013   CLINICAL DATA:  Right flank pain, known kidney stone, history anxiety, dementia, acute renal failure, COPD, diabetes, hypertension  EXAM: CT ABDOMEN AND PELVIS WITHOUT CONTRAST  TECHNIQUE: Multidetector CT imaging of the abdomen and pelvis was performed following the standard protocol without intravenous contrast.  COMPARISON:  03/03/2013   FINDINGS: Small left pleural effusion and associated left lower lobe atelectasis.  Scattered atherosclerotic calcifications aorta and coronary arteries with minimal pericardial fluid.  Dependent 12 mm diameter gallstone in gallbladder.  IVC filter.  Nonobstructing calculus at inferior pole right kidney 9 mm diameter image 44.  Minimal dilatation of the right renal collecting system and proximal right ureter without identification of a ureteral calculus.  Within limits of a nonenhanced exam, no focal abnormalities of the liver, spleen, pancreas, left kidney, or adrenal glands.  Sigmoid diverticulosis without evidence of diverticulitis.  Stomach and bowel loops otherwise unremarkable for technique.  Uterus surgically absent with nonvisualization of the ovaries and appendix.  Low descent of ,bladder base in pelvis likely representing a cystocele.  No mass, adenopathy, free fluid or inflammatory process.  Bones demineralized with scattered degenerative disc disease changes of the thoracolumbar spine.  IMPRESSION: Nonobstructing 9 mm calculus at lower pole right kidney.  Minimal dilatation of the right renal collecting system and proximal right ureter though no ureteral calculus is evident.  Probable cystocele.  Sigmoid  diverticulosis without evidence of diverticulitis.  Cholelithiasis.  Small left pleural effusion and basilar atelectasis.   Electronically Signed   By: Lavonia Dana M.D.   On: 09/17/2013 20:37       EKG Interpretation   Date/Time:  Friday September 17 2013 18:23:17 EDT Ventricular Rate:  66 PR Interval:  278 QRS Duration: 74 QT Interval:  398 QTC Calculation: 417 R Axis:   56 Text Interpretation:  Sinus rhythm with 1st degree A-V block Otherwise  normal ECG When compared with ECG of 01-May-2013 20:54, Fusion complexes  are no longer Present Premature ventricular complexes are no longer  Present Confirmed by Kawthar Ennen  MD-I, Cartrell Bentsen ([id number]) on 09/18/2013 1:00:55 AM      MDM   Final diagnoses:   Abdominal pain  Dehydration  Pneumonia  Dementia with behavioral disturbance    Plan admission  Rolland Porter, MD, FACEP   I personally performed the services described in this documentation, which was scribed in my presence. The recorded information has been reviewed and considered.  Rolland Porter, MD, FACEP     Janice Norrie, MD 09/18/13 [phone]

## 2013-09-17 NOTE — ED Notes (Signed)
Patient w/history of renal and GB stones presents w/abdominal, back, shoulder and chest pain according to staff from [place].  Patient's baseline is confused, but staff states she has been crying out in pain intermittently for past two days, escalating today.  Staff denies patient having n/v/d.  Patient holds R abdomen and cries in pain with movment.

## 2013-09-17 NOTE — ED Notes (Signed)
Family at bedside. Patient singing at this time.

## 2013-09-18 ENCOUNTER — Inpatient Hospital Stay (HOSPITAL_COMMUNITY): Payer: PRIVATE HEALTH INSURANCE

## 2013-09-18 DIAGNOSIS — R634 Abnormal weight loss: Secondary | ICD-10-CM

## 2013-09-18 DIAGNOSIS — J189 Pneumonia, unspecified organism: Secondary | ICD-10-CM | POA: Diagnosis present

## 2013-09-18 LAB — URINALYSIS, ROUTINE W REFLEX MICROSCOPIC
BILIRUBIN URINE: NEGATIVE
Glucose, UA: NEGATIVE mg/dL
HGB URINE DIPSTICK: NEGATIVE
KETONES UR: NEGATIVE mg/dL
Nitrite: NEGATIVE
PROTEIN: NEGATIVE mg/dL
Specific Gravity, Urine: 1.015 (ref 1.005–1.030)
Urobilinogen, UA: 0.2 mg/dL (ref 0.0–1.0)
pH: 5.5 (ref 5.0–8.0)

## 2013-09-18 LAB — CBC
HEMATOCRIT: 26.1 % — AB (ref 36.0–46.0)
Hemoglobin: 8.7 g/dL — ABNORMAL LOW (ref 12.0–15.0)
MCH: 29.4 pg (ref 26.0–34.0)
MCHC: 33.3 g/dL (ref 30.0–36.0)
MCV: 88.2 fL (ref 78.0–100.0)
Platelets: 255 10*3/uL (ref 150–400)
RBC: 2.96 MIL/uL — ABNORMAL LOW (ref 3.87–5.11)
RDW: 13.1 % (ref 11.5–15.5)
WBC: 7.7 10*3/uL (ref 4.0–10.5)

## 2013-09-18 LAB — COMPREHENSIVE METABOLIC PANEL
ALBUMIN: 2.4 g/dL — AB (ref 3.5–5.2)
ALK PHOS: 76 U/L (ref 39–117)
ALT: 6 U/L (ref 0–35)
AST: 13 U/L (ref 0–37)
BUN: 28 mg/dL — ABNORMAL HIGH (ref 6–23)
CO2: 25 mEq/L (ref 19–32)
Calcium: 8.4 mg/dL (ref 8.4–10.5)
Chloride: 103 mEq/L (ref 96–112)
Creatinine, Ser: 1.79 mg/dL — ABNORMAL HIGH (ref 0.50–1.10)
GFR calc non Af Amer: 25 mL/min — ABNORMAL LOW (ref >90)
GFR, EST AFRICAN AMERICAN: 29 mL/min — AB (ref >90)
GLUCOSE: 107 mg/dL — AB (ref 70–99)
POTASSIUM: 4.2 meq/L (ref 3.7–5.3)
SODIUM: 138 meq/L (ref 137–147)
TOTAL PROTEIN: 6.2 g/dL (ref 6.0–8.3)
Total Bilirubin: 0.4 mg/dL (ref 0.3–1.2)

## 2013-09-18 LAB — GLUCOSE, CAPILLARY
GLUCOSE-CAPILLARY: 147 mg/dL — AB (ref 70–99)
Glucose-Capillary: 88 mg/dL (ref 70–99)
Glucose-Capillary: 75 mg/dL (ref 70–99)

## 2013-09-18 LAB — URINE MICROSCOPIC-ADD ON

## 2013-09-18 LAB — STREP PNEUMONIAE URINARY ANTIGEN: Strep Pneumo Urinary Antigen: NEGATIVE

## 2013-09-18 MED ORDER — ONDANSETRON HCL 4 MG/2ML IJ SOLN: 4.0000 mg | Freq: Four times a day (QID) | INTRAMUSCULAR | Status: DC | PRN

## 2013-09-18 MED ORDER — ATORVASTATIN CALCIUM 10 MG PO TABS
10.0000 mg | ORAL_TABLET | Freq: Every day | ORAL | Status: DC
  Administered 2013-09-18 – 2013-09-20 (×4): 10 mg via ORAL
  Filled 2013-09-18 (×4): qty 1

## 2013-09-18 MED ORDER — BOOST / RESOURCE BREEZE PO LIQD
1.0000 | Freq: Three times a day (TID) | ORAL | Status: DC
  Administered 2013-09-18 – 2013-09-21 (×6): 1 via ORAL

## 2013-09-18 MED ORDER — SODIUM CHLORIDE 0.9 % IV SOLN
INTRAVENOUS | Status: DC
  Administered 2013-09-18: 21:00:00 via INTRAVENOUS
  Administered 2013-09-18: 1000 mL via INTRAVENOUS

## 2013-09-18 MED ORDER — VANCOMYCIN HCL IN DEXTROSE 750-5 MG/150ML-% IV SOLN
750.0000 mg | Freq: Once | INTRAVENOUS | Status: AC
  Administered 2013-09-18: 750 mg via INTRAVENOUS
  Filled 2013-09-18: qty 150

## 2013-09-18 MED ORDER — DEXTROSE 5 % IV SOLN
INTRAVENOUS | Status: AC
  Filled 2013-09-18: qty 1

## 2013-09-18 MED ORDER — DOCUSATE SODIUM 100 MG PO CAPS
100.0000 mg | ORAL_CAPSULE | Freq: Two times a day (BID) | ORAL | Status: DC
  Administered 2013-09-18 – 2013-09-21 (×8): 100 mg via ORAL
  Filled 2013-09-18 (×8): qty 1

## 2013-09-18 MED ORDER — PANTOPRAZOLE SODIUM 40 MG PO TBEC
40.0000 mg | DELAYED_RELEASE_TABLET | Freq: Every day | ORAL | Status: DC
  Administered 2013-09-18 – 2013-09-21 (×4): 40 mg via ORAL
  Filled 2013-09-18 (×4): qty 1

## 2013-09-18 MED ORDER — CEFEPIME HCL 1 G IJ SOLR
1.0000 g | Freq: Once | INTRAMUSCULAR | Status: AC
  Administered 2013-09-18: 1 g via INTRAVENOUS
  Filled 2013-09-18: qty 1

## 2013-09-18 MED ORDER — INSULIN ASPART 100 UNIT/ML ~~LOC~~ SOLN
0.0000 [IU] | Freq: Three times a day (TID) | SUBCUTANEOUS | Status: DC
  Administered 2013-09-19: 2 [IU] via SUBCUTANEOUS
  Administered 2013-09-19 – 2013-09-21 (×2): 1 [IU] via SUBCUTANEOUS

## 2013-09-18 MED ORDER — ONDANSETRON HCL 4 MG PO TABS: 4.0000 mg | ORAL_TABLET | Freq: Four times a day (QID) | ORAL | Status: DC | PRN

## 2013-09-18 MED ORDER — QUETIAPINE FUMARATE 25 MG PO TABS
50.0000 mg | ORAL_TABLET | Freq: Three times a day (TID) | ORAL | Status: DC
  Administered 2013-09-18 – 2013-09-21 (×9): 50 mg via ORAL
  Filled 2013-09-18 (×9): qty 2

## 2013-09-18 MED ORDER — ESCITALOPRAM OXALATE 10 MG PO TABS
15.0000 mg | ORAL_TABLET | Freq: Every day | ORAL | Status: DC
  Administered 2013-09-18 – 2013-09-21 (×4): 15 mg via ORAL
  Filled 2013-09-18 (×4): qty 2

## 2013-09-18 MED ORDER — ALPRAZOLAM 0.5 MG PO TABS
0.5000 mg | ORAL_TABLET | Freq: Four times a day (QID) | ORAL | Status: DC | PRN
  Administered 2013-09-18 – 2013-09-21 (×4): 0.5 mg via ORAL
  Filled 2013-09-18 (×4): qty 1

## 2013-09-18 MED ORDER — ACETAMINOPHEN 650 MG RE SUPP: 650.0000 mg | Freq: Four times a day (QID) | RECTAL | Status: DC | PRN

## 2013-09-18 MED ORDER — VANCOMYCIN HCL IN DEXTROSE 750-5 MG/150ML-% IV SOLN
750.0000 mg | INTRAVENOUS | Status: DC
  Administered 2013-09-19 – 2013-09-21 (×3): 750 mg via INTRAVENOUS
  Filled 2013-09-18 (×4): qty 150

## 2013-09-18 MED ORDER — AMLODIPINE BESYLATE 5 MG PO TABS
5.0000 mg | ORAL_TABLET | Freq: Every morning | ORAL | Status: DC
  Administered 2013-09-18 – 2013-09-21 (×4): 5 mg via ORAL
  Filled 2013-09-18 (×4): qty 1

## 2013-09-18 MED ORDER — VANCOMYCIN HCL IN DEXTROSE 750-5 MG/150ML-% IV SOLN
INTRAVENOUS | Status: AC
  Filled 2013-09-18: qty 150

## 2013-09-18 MED ORDER — ACETAMINOPHEN 325 MG PO TABS: 650.0000 mg | ORAL_TABLET | Freq: Four times a day (QID) | ORAL | Status: DC | PRN

## 2013-09-18 MED ORDER — ACETAMINOPHEN 500 MG PO TABS
500.0000 mg | ORAL_TABLET | Freq: Two times a day (BID) | ORAL | Status: DC
  Administered 2013-09-18 – 2013-09-21 (×7): 500 mg via ORAL
  Filled 2013-09-18 (×7): qty 1

## 2013-09-18 MED ORDER — HEPARIN SODIUM (PORCINE) 5000 UNIT/ML IJ SOLN
5000.0000 [IU] | Freq: Three times a day (TID) | INTRAMUSCULAR | Status: DC
  Administered 2013-09-18 – 2013-09-19 (×4): 5000 [IU] via SUBCUTANEOUS
  Filled 2013-09-18 (×4): qty 1

## 2013-09-18 MED ORDER — HALOPERIDOL LACTATE 5 MG/ML IJ SOLN
2.0000 mg | Freq: Four times a day (QID) | INTRAMUSCULAR | Status: DC | PRN
  Administered 2013-09-18 – 2013-09-21 (×7): 2 mg via INTRAVENOUS
  Filled 2013-09-18 (×7): qty 1

## 2013-09-18 NOTE — Progress Notes (Signed)
INITIAL NUTRITION ASSESSMENT  DOCUMENTATION CODES Per approved criteria  -Not Applicable   INTERVENTION: Resource Breeze po TID, each supplement provides 250 kcal and 9 grams of protein  NUTRITION DIAGNOSIS: Inadequate oral intake related to altered GI function as evidenced by abdominal pain, poor appetite PTA.   Goal: Pt will meet >90% of estimated nutritional needs  Monitor:  PO intake, labs skin assessments, weight changes, I/O's  Reason for Assessment: MST=2  80 y.o. female  Admitting Dx: Abdominal pain  ASSESSMENT: Pt is a resident of [facility] who was admitted with abdomina, pain and dehydration. Per H&P, pt with decreased PO intake due to abdominal pain. Pt with distant hx of weight loss, however, has improved. Noted a 14# (10%) wt gain x 6 months, which is clinically significant, however, favorable to due past wt loss trend.  Noted lunch tray has been unattempted. Per MAR, pt does have nutritional supplements ordered at [facility]. Pt unable to to provide hx due to cognitive impairment. Family member in the room was somnolent and unable to arouse. Unable to perform physical exam, but noted no depletion in the temple, clavicle, and orbital areas.  Pt is scheduled for EGD on 09/19/13.  Height: Ht Readings from Last 1 Encounters:  09/18/13 5\' 3"  (1.6 m)    Weight: Wt Readings from Last 1 Encounters:  09/18/13 154 lb 9.6 oz (70.126 kg)    Ideal Body Weight: 115#  % Ideal Body Weight: 175%  Wt Readings from Last 10 Encounters:  09/18/13 154 lb 9.6 oz (70.126 kg)  05/01/13 140 lb (63.504 kg)  03/31/13 140 lb 11.2 oz (63.821 kg)  03/28/11 143 lb (64.864 kg)  03/28/11 143 lb (64.864 kg)  03/25/11 143 lb (64.864 kg)  03/13/11 143 lb (64.864 kg)  03/13/11 143 lb (64.864 kg)  03/11/11 143 lb (64.864 kg)    Usual Body Weight: 143#  % Usual Body Weight: 108%  BMI:  Body mass index is 27.39 kg/(m^2). Meets criteria for overweight.  Estimated Nutritional Needs: Kcal:  1610-9604 daily Protein: 56-70 grams daily Fluid: 1.5-1.8 L daily  Skin: no issues noted  Diet Order: Dysphagia  EDUCATION NEEDS: -Education not appropriate at this time   Intake/Output Summary (Last 24 hours) at 09/18/13 1343 Last data filed at 09/17/13 2219  Gross per 24 hour  Intake      0 ml  Output      1 ml  Net     -1 ml    Last BM: PTA   Labs:   Recent Labs Lab 09/17/13 1906 09/18/13 0453  NA 137 138  K 4.5 4.2  CL 98 103  CO2 26 25  BUN 30* 28*  CREATININE 1.82* 1.79*  CALCIUM 9.5 8.4  GLUCOSE 134* 107*    CBG (last 3)   Recent Labs  09/18/13 1155  GLUCAP 88    Scheduled Meds: . acetaminophen  500 mg Oral BID  . amLODipine  5 mg Oral q morning - 10a  . atorvastatin  10 mg Oral QHS  . docusate sodium  100 mg Oral BID  . escitalopram  15 mg Oral Daily  . heparin  5,000 Units Subcutaneous 3 times per day  . insulin aspart  0-9 Units Subcutaneous TID WC  . pantoprazole  40 mg Oral QAC breakfast  . QUEtiapine  50 mg Oral TID  . [START ON 09/19/2013] vancomycin  750 mg Intravenous Q24H    Continuous Infusions: . sodium chloride 1,000 mL (09/18/13 0121)    Past  Medical History  Diagnosis Date  . Chronic kidney disease     acute renal failure  . COPD (chronic obstructive pulmonary disease)   . Diabetes mellitus   . Depression   . DEMENTIA   . Hypertension   . Shortness of breath   . GERD (gastroesophageal reflux disease)   . Gallstones   . Diverticulosis   . Concern about mental disorder without diagnosis   . Dehydration   . Anxiety   . Dysphagia   . Anemia   . Cholelithiases   . Renal stones   . Hyperlipidemia   . Hypokalemia   . Dementia     Past Surgical History  Procedure Laterality Date  . Abdominal hysterectomy    . Cystoscopy  2008    with stent placement  . Extracorporeal shock wave lithotripsy  03/13/2011    Procedure: EXTRACORPOREAL SHOCK WAVE LITHOTRIPSY (ESWL);  Surgeon: Marissa Nestle;  Location: [facility];   Service: Urology;  Laterality: Right;  Right Renal Calculus  . Cystoscopy with litholapaxy  03/28/2011    Procedure: CYSTOSCOPY WITH LITHOLAPAXY;  Surgeon: Marissa Nestle;  Location: [facility];  Service: Urology;  Laterality: Right;  . Cystoscopy w/ ureteral stent placement  03/28/2011    Procedure: CYSTOSCOPY WITH RETROGRADE PYELOGRAM/URETERAL STENT PLACEMENT;  Surgeon: Marissa Nestle;  Location: [facility];  Service: Urology;  Laterality: Right;  . Extracorporeal shock wave lithotripsy  09/25/2011    Procedure: EXTRACORPOREAL SHOCK WAVE LITHOTRIPSY (ESWL);  Surgeon: Marissa Nestle, MD;  Location: [facility];  Service: Urology;  Laterality: Right;  ESWL Right Renal Calculus  . Esophagogastroduodenoscopy  03/19/2005    LAG:TXMIWOE antral gastritis, need to rule out H pylori infection/Pyloric channel ulcer without stigmata of bleed/Bulbar ulcer with duodenitis without stigmata of bleed    Rhian Funari A. Jimmye Norman, RD, LDN Pager: [phone]

## 2013-09-18 NOTE — Progress Notes (Signed)
Subjective: Patient was admitted due to abdominal pain. Ct scan shows renal stone (non obstructing) and gall stone without sign of inflammation. Patient is being treated for pneumonia.  Objective: Vital signs in last 24 hours: Temp:  [98 F (36.7 C)-100 F (37.8 C)] 98 F (36.7 C) (04/25 0134) Pulse Rate:  [63-68] 63 (04/25 0134) Resp:  [16-26] 20 (04/25 0134) BP: (91-125)/(48-65) 125/65 mmHg (04/25 0134) SpO2:  [93 %-98 %] 93 % (04/25 0134) Weight:  [63.504 kg (140 lb)-70.126 kg (154 lb 9.6 oz)] 70.126 kg (154 lb 9.6 oz) (04/25 0134) Weight change:  Last BM Date:  (Not sure per daughter)  Intake/Output from previous day: 04/24 0701 - 04/25 0700 In: -  Out: 1 [Urine:1]  PHYSICAL EXAM General appearance: no distress and slowed mentation Resp: diminished breath sounds bilaterally and rhonchi bilaterally Cardio: S1, S2 normal GI: soft, non-tender; bowel sounds normal; no masses,  no organomegaly Extremities: extremities normal, atraumatic, no cyanosis or edema  Lab Results:    @labtest @ [facility] No results found for this basename: PHART, PCO2, PO2ART, TCO2, HCO3,  in the last 72 hours CULTURES Recent Results (from the past 240 hour(s))  CULTURE, BLOOD (ROUTINE X 2)     Status: None   Collection Time    09/17/13  7:01 PM      Result Value Ref Range Status   Specimen Description BLOOD BLOOD RIGHT ARM   Final   Special Requests BOTTLES DRAWN AEROBIC AND ANAEROBIC 6CC   Final   Culture PENDING   Incomplete   Report Status PENDING   Incomplete   Studies/Results: Ct Abdomen Pelvis Wo Contrast  09/17/2013   CLINICAL DATA:  Right flank pain, known kidney stone, history anxiety, dementia, acute renal failure, COPD, diabetes, hypertension  EXAM: CT ABDOMEN AND PELVIS WITHOUT CONTRAST  TECHNIQUE: Multidetector CT imaging of the abdomen and pelvis was performed following the standard protocol without intravenous contrast.  COMPARISON:  03/03/2013  FINDINGS: Small left pleural effusion  and associated left lower lobe atelectasis.  Scattered atherosclerotic calcifications aorta and coronary arteries with minimal pericardial fluid.  Dependent 12 mm diameter gallstone in gallbladder.  IVC filter.  Nonobstructing calculus at inferior pole right kidney 9 mm diameter image 44.  Minimal dilatation of the right renal collecting system and proximal right ureter without identification of a ureteral calculus.  Within limits of a nonenhanced exam, no focal abnormalities of the liver, spleen, pancreas, left kidney, or adrenal glands.  Sigmoid diverticulosis without evidence of diverticulitis.  Stomach and bowel loops otherwise unremarkable for technique.  Uterus surgically absent with nonvisualization of the ovaries and appendix.  Low descent of ,bladder base in pelvis likely representing a cystocele.  No mass, adenopathy, free fluid or inflammatory process.  Bones demineralized with scattered degenerative disc disease changes of the thoracolumbar spine.  IMPRESSION: Nonobstructing 9 mm calculus at lower pole right kidney.  Minimal dilatation of the right renal collecting system and proximal right ureter though no ureteral calculus is evident.  Probable cystocele.  Sigmoid diverticulosis without evidence of diverticulitis.  Cholelithiasis.  Small left pleural effusion and basilar atelectasis.   Electronically Signed   By: Lavonia Dana M.D.   On: 09/17/2013 20:37   Dg Chest Portable 1 View  09/17/2013   CLINICAL DATA:  Chest pain.  EXAM: PORTABLE CHEST - 1 VIEW  COMPARISON:  05/01/2013  FINDINGS: The patient is mildly rotated to the right. Cardiomediastinal silhouette is grossly unchanged. Lungs remain hypoinflated. There is new patchy opacity in the left  lung base with partial obscuration of the left hemidiaphragm. Right lung remains grossly clear. No definite pleural effusion or pneumothorax is identified. No acute osseous abnormality is seen. Multilevel osteophytosis is present in the thoracic spine.   IMPRESSION: New infiltrate in the left lung base could reflect pneumonia.   Electronically Signed   By: Logan Bores   On: 09/17/2013 18:38    Medications: I have reviewed the patient's current medications.  Assesment: Principal Problem:   Abdominal pain Active Problems:   Dementia with behavioral disturbance   Dehydration   Pneumonia    Plan: Medications reviewed Will continue current treatment Will GI consult    LOS: 1 day   Danyal Adorno 09/18/2013, 7:45 AM

## 2013-09-18 NOTE — Progress Notes (Signed)
Patient's family stated patient feel at nursing home last Saturday and has complained of left shoulder pain.  Dr. Everette Rank notified.  Gave order for xray of left shoulder.

## 2013-09-18 NOTE — Progress Notes (Signed)
ANTIBIOTIC CONSULT NOTE-Preliminary  Pharmacy Consult for Vancomycin Indication: Pneumonia   No Known Allergies  Patient Measurements: Height: 5\' 4"  (162.6 cm) Weight: 154 lb 9.6 oz (70.126 kg) IBW/kg (Calculated) : 54.7   Vital Signs: Temp: 98 F (36.7 C) (04/25 0134) Temp src: Oral (04/25 0134) BP: 125/65 mmHg (04/25 0134) Pulse Rate: 63 (04/25 0134)  Labs:  Recent Labs  09/17/13 1906  WBC 11.7*  HGB 10.3*  PLT 304  CREATININE 1.82*    Estimated Creatinine Clearance: 22.9 ml/min (by C-G formula based on Cr of 1.82).  No results found for this basename: VANCOTROUGH, VANCOPEAK, VANCORANDOM, GENTTROUGH, GENTPEAK, GENTRANDOM, TOBRATROUGH, TOBRAPEAK, TOBRARND, AMIKACINPEAK, AMIKACINTROU, AMIKACIN,  in the last 72 hours   Microbiology: No results found for this or any previous visit (from the past 720 hour(s)).  Medical History: Past Medical History  Diagnosis Date  . Chronic kidney disease     acute renal failure  . COPD (chronic obstructive pulmonary disease)   . Diabetes mellitus   . Depression   . DEMENTIA   . Hypertension   . Shortness of breath   . GERD (gastroesophageal reflux disease)   . Gallstones   . Diverticulosis   . Concern about mental disorder without diagnosis   . Dehydration   . Anxiety   . Dysphagia   . Anemia   . Cholelithiases   . Renal stones   . Hyperlipidemia   . Hypokalemia   . Dementia     Medications:  Cefepime 1 Gm IV on admission to be continued per renal dosing adjustment by Pharmacy.  Assessment: 80 yo female nursing home resident admitted with abdominal pain, low-grade fever with abnormal chest x-ray showing possible left lower lobe infiltrate, recent UTI, and dehydration. Empiric antibiotics for HCAP.  Goal of Therapy:  Vancomycin troughs 15-20 mcg/ml  Plan:  Preliminary review of pertinent patient information completed.  Protocol will be initiated with a one-time dose of Vancomycin 750 mg IV.  Forestine Na clinical  pharmacist will complete review during morning rounds to assess patient and finalize treatment regimen.  Norberto Sorenson, [facility] 09/18/2013,1:50 AM .

## 2013-09-18 NOTE — Consult Note (Signed)
Referring Provider: No ref. provider found Primary Care Physician:  Rosita Fire, MD Primary Gastroenterologist:  Danie Binder  Reason for Consultation:  WEIGHT LOSS  HPI:  PT UNABLE TO PROVIDE DUE TO COGNITIVE DYSFUNCTION. MEDICAL RECORDS REVIEWED FROM OCT 2014 TO PRESENT. PT SEEN BY [facility] OCT 2014 FOR UNEXPLAINED WEIGHT LOSS(161 LBS). FAMILY DECLINED EGD. PT PRESENTS WITH VOMTIING AND CONTINUED WEIGHT LOSS. PT SEEN AND EVALUATED IN THE ED FOR  abdominal, back, shoulder, and chest pain for the last few days.  NOW WEIGHS 154 LBS. ATTEMPTED TO CONTACT POA: NORMA BLAKELY-LVM. SPOKE [place] AGREES TO EGD.  Past Medical History  Diagnosis Date  . Chronic kidney disease     acute renal failure  . COPD (chronic obstructive pulmonary disease)   . Diabetes mellitus   . Depression   . DEMENTIA   . Hypertension   . Shortness of breath   . GERD (gastroesophageal reflux disease)   . Gallstones   . Diverticulosis   . Concern about mental disorder without diagnosis   . Dehydration   . Anxiety   . Dysphagia   . Anemia   . Cholelithiases   . Renal stones   . Hyperlipidemia   . Hypokalemia   . Dementia     Past Surgical History  Procedure Laterality Date  . Abdominal hysterectomy    . Cystoscopy  2008    with stent placement  . Extracorporeal shock wave lithotripsy  03/13/2011    Procedure: EXTRACORPOREAL SHOCK WAVE LITHOTRIPSY (ESWL);  Surgeon: Marissa Nestle;  Location: [facility];  Service: Urology;  Laterality: Right;  Right Renal Calculus  . Cystoscopy with litholapaxy  03/28/2011    Procedure: CYSTOSCOPY WITH LITHOLAPAXY;  Surgeon: Marissa Nestle;  Location: [facility];  Service: Urology;  Laterality: Right;  . Cystoscopy w/ ureteral stent placement  03/28/2011    Procedure: CYSTOSCOPY WITH RETROGRADE PYELOGRAM/URETERAL STENT PLACEMENT;  Surgeon: Marissa Nestle;  Location: [facility];  Service: Urology;  Laterality: Right;  . Extracorporeal shock wave lithotripsy   09/25/2011    Procedure: EXTRACORPOREAL SHOCK WAVE LITHOTRIPSY (ESWL);  Surgeon: Marissa Nestle, MD;  Location: [facility];  Service: Urology;  Laterality: Right;  ESWL Right Renal Calculus  . Esophagogastroduodenoscopy  03/19/2005    YWV:[id number] antral gastritis, need to rule out H pylori infection/Pyloric channel ulcer without stigmata of bleed/Bulbar ulcer with duodenitis without stigmata of bleed    Prior to Admission medications   Medication Sig Start Date End Date Taking? Authorizing Provider  acetaminophen (TYLENOL) 500 MG tablet Take 500 mg by mouth 2 (two) times daily.   Yes Historical Provider, MD  ALPRAZolam Duanne Moron) 0.5 MG tablet Take 0.5 mg by mouth every 6 (six) hours as needed for anxiety.    Yes Historical Provider, MD  amLODipine (NORVASC) 5 MG tablet Take 5 mg by mouth every morning.    Yes Historical Provider, MD  atorvastatin (LIPITOR) 10 MG tablet Take 10 mg by mouth at bedtime.    Yes Historical Provider, MD  Calcium Carbonate-Vitamin D (CALCARB 600/D) 600-400 MG-UNIT per tablet Take 1 tablet by mouth 2 (two) times daily.   Yes Historical Provider, MD  cephALEXin (KEFLEX) 500 MG capsule Take 500 mg by mouth 2 (two) times daily. 7 day course starting on 09/09/2013   Yes Historical Provider, MD  escitalopram (LEXAPRO) 5 MG tablet Take 15 mg by mouth daily.    Yes Historical Provider, MD  ferrous fumarate (HEMOCYTE - 106 MG FE) 325 (106 FE) MG  TABS Take 1 tablet by mouth daily.    Yes Historical Provider, MD  fluticasone (FLONASE) 50 MCG/ACT nasal spray Place 2 sprays into the nose daily. allergies    Yes Historical Provider, MD  haloperidol lactate (HALDOL) 5 MG/ML injection Inject 1.5 mg into the muscle every 8 (eight) hours as needed (for agitation).   Yes Historical Provider, MD  HYDROcodone-acetaminophen (NORCO/VICODIN) 5-325 MG per tablet Take 1 tablet by mouth every 6 (six) hours as needed for moderate pain. pain   Yes Historical Provider, MD  ondansetron (ZOFRAN) 4 MG tablet  Take 4 mg by mouth every 8 (eight) hours as needed for nausea or vomiting.   Yes Historical Provider, MD  pantoprazole (PROTONIX) 40 MG tablet Take 1 tablet (40 mg total) by mouth daily before breakfast. 04/16/13  Yes Mahala Menghini, PA-C  QUEtiapine (SEROQUEL) 25 MG tablet Take 12.5-25 mg by mouth 2 (two) times daily. Takes one-half tablet at 10am and one tablet at bedtime   Yes Historical Provider, MD  QUEtiapine (SEROQUEL) 50 MG tablet Take 50 mg by mouth 3 (three) times daily.   Yes Historical Provider, MD  senna (SENOKOT) 8.6 MG TABS Take 3 tablets by mouth every morning.    Yes Historical Provider, MD  vitamin B-12 (CYANOCOBALAMIN) 1000 MCG tablet Take 1,000 mcg by mouth every morning.    Yes Historical Provider, MD    Current Facility-Administered Medications  Medication Dose Route Frequency Provider Last Rate Last Dose  . 0.9 %  sodium chloride infusion   Intravenous Continuous Bonnielee Haff, MD 100 mL/hr at 09/18/13 0121 1,000 mL at 09/18/13 0121  . acetaminophen (TYLENOL) tablet 650 mg  650 mg Oral Q6H PRN Bonnielee Haff, MD       Or  . acetaminophen (TYLENOL) suppository 650 mg  650 mg Rectal Q6H PRN Bonnielee Haff, MD      . acetaminophen (TYLENOL) tablet 500 mg  500 mg Oral BID Bonnielee Haff, MD   500 mg at 09/18/13 0232  . ALPRAZolam Duanne Moron) tablet 0.5 mg  0.5 mg Oral Q6H PRN Bonnielee Haff, MD   0.5 mg at 09/18/13 0232  . amLODipine (NORVASC) tablet 5 mg  5 mg Oral q morning - 10a Bonnielee Haff, MD      . atorvastatin (LIPITOR) tablet 10 mg  10 mg Oral QHS Bonnielee Haff, MD   10 mg at 09/18/13 0232  . docusate sodium (COLACE) capsule 100 mg  100 mg Oral BID Bonnielee Haff, MD   100 mg at 09/18/13 0232  . escitalopram (LEXAPRO) tablet 15 mg  15 mg Oral Daily Bonnielee Haff, MD      . haloperidol lactate (HALDOL) injection 2 mg  2 mg Intravenous Q6H PRN Bonnielee Haff, MD   2 mg at 09/18/13 0233  . heparin injection 5,000 Units  5,000 Units Subcutaneous 3 times per day Bonnielee Haff, MD   5,000 Units at 09/18/13 0233  . insulin aspart (novoLOG) injection 0-9 Units  0-9 Units Subcutaneous TID WC Rosita Fire, MD      . ondansetron (ZOFRAN) tablet 4 mg  4 mg Oral Q6H PRN Bonnielee Haff, MD       Or  . ondansetron (ZOFRAN) injection 4 mg  4 mg Intravenous Q6H PRN Bonnielee Haff, MD      . pantoprazole (PROTONIX) EC tablet 40 mg  40 mg Oral QAC breakfast Bonnielee Haff, MD      . QUEtiapine (SEROQUEL) tablet 50 mg  50 mg Oral TID Bonnielee Haff, MD      . [  START ON 09/19/2013] vancomycin (VANCOCIN) IVPB 750 mg/150 ml premix  750 mg Intravenous Q24H Rosita Fire, MD        Allergies as of 09/17/2013  . (No Known Allergies)   Family History  Problem Relation Age of Onset  . Anesthesia problems Neg Hx   . Hypotension Neg Hx   . Malignant hyperthermia Neg Hx   . Pseudochol deficiency Neg Hx    History   Social History  . Marital Status: Widowed    Spouse Name: N/A    Number of Children: N/A  . Years of Education: N/A   Social History Main Topics  . Smoking status: Former Smoker -- 0.50 packs/day for 30 years    Types: Cigarettes    Quit date: 03/25/2003  . Smokeless tobacco: Not on file  . Alcohol Use: No  . Drug Use: No  . Sexual Activity: No     Review of Systems: LIMITED DUE TO PT COGNITIVE DYSFUNCTION  Vitals: Blood pressure 125/65, pulse 63, temperature 98 F (36.7 C), temperature source Oral, resp. rate 20, height 5\' 3"  (1.6 m), weight 154 lb 9.6 oz (70.126 kg), SpO2 93.00%.  Physical Exam: General:   AROUSABLEt,  pleasant and cooperative in NAD Head:  Normocephalic and atraumatic. Eyes:  Sclera clear, no icterus.   Conjunctiva pink. Mouth:  No deformity or lesions, dentition ABnormal. Lungs:  Clear throughout to auscultation.   No wheezes. No acute distress. Heart:  Regular rate and rhythm; no murmurs. Abdomen:  Soft, INITIALLY nontender, BUT C/O ABD PAIN WHEN ASKED DURING THE EXAM, nondistended. No masses noted. Normal bowel sounds,  without guarding, and without rebound.    Msk:  Symmetrical. Extremities:  Without edema. Neurologic:  Alert and  INTERACTIVE, NO  NEW FOCAL DEFICITS Psych:  Alert, AROUSABLE, FLAT affect.   Lab Results:  Recent Labs  09/17/13 1906 09/18/13 0453  WBC 11.7* 7.7  HGB 10.3* 8.7*  HCT 30.9* 26.1*  PLT 304 255   BMET  Recent Labs  09/17/13 1906 09/18/13 0453  NA 137 138  K 4.5 4.2  CL 98 103  CO2 26 25  GLUCOSE 134* 107*  BUN 30* 28*  CREATININE 1.82* 1.79*  CALCIUM 9.5 8.4   LFT  Recent Labs  09/18/13 0453  PROT 6.2  ALBUMIN 2.4*  AST 13  ALT 6  ALKPHOS 76  BILITOT 0.4     Studies/Results: CT ABD/PELVIS APR 25: NO ACUTE DISEASE, GALLSTONES, KIDNEY STONE  Impression: ADMITTED WITH GENERALIZED PAIN. EVALUATION UNREVEALING. HAS BEEN SEEN IN PAST FOR WEIGHT LOSS WHICH IS MOST LIKELY DUE TO ADVANCED DEMENTIA, LESS LIKELY GASTRIC CA OR H PYLORI GASTRITIS  Plan: 1. PLAN FOR EGD APR 26. DISCUSSED BENEFITS V. RISKS OF PROCEDURE WITH SON(WILLIAM)  2. SUPPORTIVE CARE 3. CONTINUE TO MONITOR SYMPTOMS. 4. DAILY PPI   LOS: 1 day   Chellsea Beckers L Zendaya Groseclose  09/18/2013, 10:27 AM

## 2013-09-18 NOTE — Progress Notes (Signed)
REVIEWED.  

## 2013-09-19 ENCOUNTER — Encounter (HOSPITAL_COMMUNITY)
Admission: EM | Disposition: A | Discharge status: Skilled Nursing Facility | Payer: Self-pay | Source: Home / Self Care | Attending: Internal Medicine

## 2013-09-19 ENCOUNTER — Encounter (HOSPITAL_COMMUNITY): Payer: Self-pay | Admitting: *Deleted

## 2013-09-19 DIAGNOSIS — K222 Esophageal obstruction: Secondary | ICD-10-CM | POA: Diagnosis not present

## 2013-09-19 DIAGNOSIS — R109 Unspecified abdominal pain: Secondary | ICD-10-CM | POA: Diagnosis not present

## 2013-09-19 HISTORY — PX: ESOPHAGEAL DILATION: SHX303

## 2013-09-19 HISTORY — PX: ESOPHAGOGASTRODUODENOSCOPY: SHX5428

## 2013-09-19 LAB — BASIC METABOLIC PANEL
BUN: 23 mg/dL (ref 6–23)
CHLORIDE: 104 meq/L (ref 96–112)
CO2: 23 mEq/L (ref 19–32)
CREATININE: 1.62 mg/dL — AB (ref 0.50–1.10)
Calcium: 8.8 mg/dL (ref 8.4–10.5)
GFR calc non Af Amer: 28 mL/min — ABNORMAL LOW (ref >90)
GFR, EST AFRICAN AMERICAN: 33 mL/min — AB (ref >90)
GLUCOSE: 83 mg/dL (ref 70–99)
POTASSIUM: 4.3 meq/L (ref 3.7–5.3)
Sodium: 139 mEq/L (ref 137–147)

## 2013-09-19 LAB — GLUCOSE, CAPILLARY
GLUCOSE-CAPILLARY: 79 mg/dL (ref 70–99)
GLUCOSE-CAPILLARY: 197 mg/dL — AB (ref 70–99)
Glucose-Capillary: 80 mg/dL (ref 70–99)
Glucose-Capillary: 126 mg/dL — ABNORMAL HIGH (ref 70–99)
Glucose-Capillary: 108 mg/dL — ABNORMAL HIGH (ref 70–99)

## 2013-09-19 LAB — LEGIONELLA ANTIGEN, URINE: Legionella Antigen, Urine: NEGATIVE

## 2013-09-19 SURGERY — EGD (ESOPHAGOGASTRODUODENOSCOPY)
Anesthesia: Moderate Sedation

## 2013-09-19 MED ORDER — LORAZEPAM 2 MG/ML IJ SOLN
1.0000 mg | INTRAMUSCULAR | Status: DC
  Administered 2013-09-19: 1 mg via INTRAVENOUS
  Filled 2013-09-19: qty 1

## 2013-09-19 MED ORDER — LORAZEPAM 2 MG/ML IJ SOLN
1.0000 mg | INTRAMUSCULAR | Status: DC | PRN
  Administered 2013-09-20 (×3): 1 mg via INTRAVENOUS
  Filled 2013-09-19 (×3): qty 1

## 2013-09-19 MED ORDER — STERILE WATER FOR IRRIGATION IR SOLN
Status: DC | PRN
  Administered 2013-09-19: 10:00:00

## 2013-09-19 MED ORDER — DEXTROSE-NACL 5-0.9 % IV SOLN
INTRAVENOUS | Status: DC
  Administered 2013-09-19: 75 mL/h via INTRAVENOUS

## 2013-09-19 MED ORDER — MIDAZOLAM HCL 5 MG/5ML IJ SOLN
INTRAMUSCULAR | Status: AC
  Filled 2013-09-19: qty 10

## 2013-09-19 MED ORDER — SODIUM CHLORIDE 0.9 % IV SOLN
INTRAVENOUS | Status: AC
  Administered 2013-09-19: 12:00:00 via INTRAVENOUS
  Administered 2013-09-20: 10 mL/h via INTRAVENOUS

## 2013-09-19 MED ORDER — SODIUM CHLORIDE 0.9 % IV SOLN: INTRAVENOUS | Status: DC

## 2013-09-19 MED ORDER — MINERAL OIL PO OIL
TOPICAL_OIL | ORAL | Status: AC
  Filled 2013-09-19: qty 30

## 2013-09-19 MED ORDER — LIDOCAINE VISCOUS 2 % MT SOLN
OROMUCOSAL | Status: AC
  Filled 2013-09-19: qty 15

## 2013-09-19 MED ORDER — MIDAZOLAM HCL 5 MG/5ML IJ SOLN
INTRAMUSCULAR | Status: DC | PRN
  Administered 2013-09-19 (×3): 1 mg via INTRAVENOUS

## 2013-09-19 MED ORDER — ENSURE PUDDING PO PUDG
1.0000 | Freq: Three times a day (TID) | ORAL | Status: DC
  Administered 2013-09-19 – 2013-09-21 (×4): 1 via ORAL

## 2013-09-19 MED ORDER — HEPARIN SODIUM (PORCINE) 5000 UNIT/ML IJ SOLN
5000.0000 [IU] | Freq: Three times a day (TID) | INTRAMUSCULAR | Status: DC
  Administered 2013-09-20 – 2013-09-21 (×4): 5000 [IU] via SUBCUTANEOUS
  Filled 2013-09-19 (×3): qty 1

## 2013-09-19 MED ORDER — MEPERIDINE HCL 100 MG/ML IJ SOLN
INTRAMUSCULAR | Status: DC | PRN
  Administered 2013-09-19 (×2): 25 mg via INTRAVENOUS

## 2013-09-19 MED ORDER — MEPERIDINE HCL 100 MG/ML IJ SOLN
INTRAMUSCULAR | Status: AC
  Filled 2013-09-19: qty 2

## 2013-09-19 MED ORDER — LIDOCAINE VISCOUS 2 % MT SOLN
OROMUCOSAL | Status: DC | PRN
  Administered 2013-09-19: 2 mL via OROMUCOSAL

## 2013-09-19 NOTE — Progress Notes (Signed)
Pt is in bed.Calmer than before. Attempts to get mitts off and hang legs off the bed. Much to tired to put up a good fight at this time. Will continue to monitor.

## 2013-09-19 NOTE — Interval H&P Note (Signed)
History and Physical Interval Note:  09/19/2013 8:09 AM  [name] [name]  has presented today for surgery, with the diagnosis of weight loss, anorexia  The various methods of treatment have been discussed with the patient and family. After consideration of risks, benefits and other options for treatment, the patient has consented to  Procedure(s): ESOPHAGOGASTRODUODENOSCOPY (EGD) (N/A) as a surgical intervention .  The patient's history has been reviewed, patient examined, no change in status, stable for surgery.  I have reviewed the patient's chart and labs.  Questions were answered to the patient's satisfaction.     Jewels Langone L Vencil Basnett

## 2013-09-19 NOTE — Progress Notes (Signed)
Pt has been given haldol 2 mg IV per order for agitation. She still is constantly yelling, trying to get out of bed, biting, kicking and is uncooperative. Ativan 1 mg IV given per new order from Dr. Everette Rank who is covering for Dr. Legrand Rams. Pt requires sitter at all times. Sitter is floating  According to unit 300 needs. Pt has mitts on and mat on floor for prevention. I and the NT sit with pt between seeing other patients. Will continue to monitor.

## 2013-09-19 NOTE — Progress Notes (Signed)
Subjective: Patient is alert, awake but confused and disoriented. She is requiring a sitter to keep her in her bed. She had EDG which showed distal gastritis and proximal esophageal web.  Objective: Vital signs in last 24 hours: Temp:  [97.6 F (36.4 C)-98.6 F (37 C)] 98.6 F (37 C) (04/26 0850) Pulse Rate:  [42-63] 59 (04/26 1610) Resp:  [14-20] 16 (04/26 1033) BP: (115-144)/(43-92) 115/55 mmHg (04/26 1610) SpO2:  [93 %-100 %] 95 % (04/26 1610) Weight change:  Last BM Date:  (unknown per daughter)  Intake/Output from previous day: 04/25 0701 - 04/26 0700 In: 2725 [P.O.:360; I.V.:2365] Out: 3350 [Urine:3350]  PHYSICAL EXAM General appearance: no distress and slowed mentation Resp: diminished breath sounds bilaterally and rhonchi bilaterally Cardio: S1, S2 normal GI: soft, non-tender; bowel sounds normal; no masses,  no organomegaly Extremities: extremities normal, atraumatic, no cyanosis or edema  Lab Results:    @labtest @ [facility] No results found for this basename: PHART, PCO2, PO2ART, TCO2, HCO3,  in the last 72 hours CULTURES Recent Results (from the past 240 hour(s))  CULTURE, BLOOD (ROUTINE X 2)     Status: None   Collection Time    09/17/13  7:01 PM      Result Value Ref Range Status   Specimen Description BLOOD BLOOD RIGHT ARM   Final   Special Requests BOTTLES DRAWN AEROBIC AND ANAEROBIC 6CC   Final   Culture NO GROWTH 1 DAY   Final   Report Status PENDING   Incomplete  CULTURE, BLOOD (ROUTINE X 2)     Status: None   Collection Time    09/18/13  4:53 AM      Result Value Ref Range Status   Specimen Description BLOOD LEFT ARM   Final   Special Requests     Final   Value: BOTTLES DRAWN AEROBIC AND ANAEROBIC 10CC EACH BOTTLE   Culture NO GROWTH 1 DAY   Final   Report Status PENDING   Incomplete   Studies/Results: Ct Abdomen Pelvis Wo Contrast  09/17/2013   CLINICAL DATA:  Right flank pain, known kidney stone, history anxiety, dementia, acute renal failure,  COPD, diabetes, hypertension  EXAM: CT ABDOMEN AND PELVIS WITHOUT CONTRAST  TECHNIQUE: Multidetector CT imaging of the abdomen and pelvis was performed following the standard protocol without intravenous contrast.  COMPARISON:  03/03/2013  FINDINGS: Small left pleural effusion and associated left lower lobe atelectasis.  Scattered atherosclerotic calcifications aorta and coronary arteries with minimal pericardial fluid.  Dependent 12 mm diameter gallstone in gallbladder.  IVC filter.  Nonobstructing calculus at inferior pole right kidney 9 mm diameter image 44.  Minimal dilatation of the right renal collecting system and proximal right ureter without identification of a ureteral calculus.  Within limits of a nonenhanced exam, no focal abnormalities of the liver, spleen, pancreas, left kidney, or adrenal glands.  Sigmoid diverticulosis without evidence of diverticulitis.  Stomach and bowel loops otherwise unremarkable for technique.  Uterus surgically absent with nonvisualization of the ovaries and appendix.  Low descent of ,bladder base in pelvis likely representing a cystocele.  No mass, adenopathy, free fluid or inflammatory process.  Bones demineralized with scattered degenerative disc disease changes of the thoracolumbar spine.  IMPRESSION: Nonobstructing 9 mm calculus at lower pole right kidney.  Minimal dilatation of the right renal collecting system and proximal right ureter though no ureteral calculus is evident.  Probable cystocele.  Sigmoid diverticulosis without evidence of diverticulitis.  Cholelithiasis.  Small left pleural effusion and basilar atelectasis.  Electronically Signed   By: Lavonia Dana M.D.   On: 09/17/2013 20:37   Dg Shoulder Left Port  09/18/2013   CLINICAL DATA:  Left shoulder pain.  EXAM: PORTABLE LEFT SHOULDER - 2+ VIEW  COMPARISON:  Plain films left shoulder 07/05/2011.  FINDINGS: There is no acute bony or joint abnormality. Degenerative change is seen about the shoulder. Imaged  left lung and ribs appear normal.  IMPRESSION: No acute finding.  Stable compared to prior exam.   Electronically Signed   By: Inge Rise M.D.   On: 09/18/2013 18:01   US Abdomen Limited Ruq  09/18/2013   CLINICAL DATA:  Abdominal pain, gallstones  EXAM: US ABDOMEN LIMITED - RIGHT UPPER QUADRANT  COMPARISON:  CT abdomen pelvis dated 09/17/2013  FINDINGS: Gallbladder:  Multiple gallstones measuring up to 1.5 cm. No gallbladder wall thickening or pericholecystic fluid. Negative sonographic Murphy's sign.  Common bile duct:  Diameter: 5 mm.  Liver:  No focal lesion identified. Within normal limits in parenchymal echogenicity.  IMPRESSION: Cholelithiasis, without associated sonographic findings to suggest acute cholecystitis.   Electronically Signed   By: Julian Hy M.D.   On: 09/18/2013 10:07    Medications: I have reviewed the patient's current medications.  Assesment: Principal Problem:   Abdominal pain Active Problems:   Dementia with behavioral disturbance   Dehydration   Pneumonia    Plan: Medications reviewed Will continue IV antibiotics Will repeat CBC/BMP Will GI consult appreciated    LOS: 2 days   Kimiyah Blick 09/19/2013, 8:19 PM

## 2013-09-19 NOTE — H&P (View-Only) (Signed)
Referring Provider: No ref. provider found Primary Care Physician:  FANTA,TESFAYE, MD Primary Gastroenterologist:  Vergene Marland L Shirlene Andaya  Reason for Consultation:  WEIGHT LOSS  HPI:  PT UNABLE TO PROVIDE DUE TO COGNITIVE DYSFUNCTION. MEDICAL RECORDS REVIEWED FROM OCT 2014 TO PRESENT. PT SEEN BY [facility] OCT 2014 FOR UNEXPLAINED WEIGHT LOSS(161 LBS). FAMILY DECLINED EGD. PT PRESENTS WITH VOMTIING AND CONTINUED WEIGHT LOSS. PT SEEN AND EVALUATED IN THE ED FOR  abdominal, back, shoulder, and chest pain for the last few days.  NOW WEIGHS 154 LBS. ATTEMPTED TO CONTACT POA: NORMA BLAKELY-LVM. SPOKE WITH SON WILLIAM. FAMILY AGREES TO EGD.  Past Medical History  Diagnosis Date  . Chronic kidney disease     acute renal failure  . COPD (chronic obstructive pulmonary disease)   . Diabetes mellitus   . Depression   . DEMENTIA   . Hypertension   . Shortness of breath   . GERD (gastroesophageal reflux disease)   . Gallstones   . Diverticulosis   . Concern about mental disorder without diagnosis   . Dehydration   . Anxiety   . Dysphagia   . Anemia   . Cholelithiases   . Renal stones   . Hyperlipidemia   . Hypokalemia   . Dementia     Past Surgical History  Procedure Laterality Date  . Abdominal hysterectomy    . Cystoscopy  2008    with stent placement  . Extracorporeal shock wave lithotripsy  03/13/2011    Procedure: EXTRACORPOREAL SHOCK WAVE LITHOTRIPSY (ESWL);  Surgeon: Mohammad I Javaid;  Location: [facility];  Service: Urology;  Laterality: Right;  Right Renal Calculus  . Cystoscopy with litholapaxy  03/28/2011    Procedure: CYSTOSCOPY WITH LITHOLAPAXY;  Surgeon: Mohammad I Javaid;  Location: [facility];  Service: Urology;  Laterality: Right;  . Cystoscopy w/ ureteral stent placement  03/28/2011    Procedure: CYSTOSCOPY WITH RETROGRADE PYELOGRAM/URETERAL STENT PLACEMENT;  Surgeon: Mohammad I Javaid;  Location: [facility];  Service: Urology;  Laterality: Right;  . Extracorporeal shock wave lithotripsy   09/25/2011    Procedure: EXTRACORPOREAL SHOCK WAVE LITHOTRIPSY (ESWL);  Surgeon: Mohammad I Javaid, MD;  Location: [facility];  Service: Urology;  Laterality: Right;  ESWL Right Renal Calculus  . Esophagogastroduodenoscopy  03/19/2005    NUR:Erosive antral gastritis, need to rule out H pylori infection/Pyloric channel ulcer without stigmata of bleed/Bulbar ulcer with duodenitis without stigmata of bleed    Prior to Admission medications   Medication Sig Start Date End Date Taking? Authorizing Provider  acetaminophen (TYLENOL) 500 MG tablet Take 500 mg by mouth 2 (two) times daily.   Yes Historical Provider, MD  ALPRAZolam (XANAX) 0.5 MG tablet Take 0.5 mg by mouth every 6 (six) hours as needed for anxiety.    Yes Historical Provider, MD  amLODipine (NORVASC) 5 MG tablet Take 5 mg by mouth every morning.    Yes Historical Provider, MD  atorvastatin (LIPITOR) 10 MG tablet Take 10 mg by mouth at bedtime.    Yes Historical Provider, MD  Calcium Carbonate-Vitamin D (CALCARB 600/D) 600-400 MG-UNIT per tablet Take 1 tablet by mouth 2 (two) times daily.   Yes Historical Provider, MD  cephALEXin (KEFLEX) 500 MG capsule Take 500 mg by mouth 2 (two) times daily. 7 day course starting on 09/09/2013   Yes Historical Provider, MD  escitalopram (LEXAPRO) 5 MG tablet Take 15 mg by mouth daily.    Yes Historical Provider, MD  ferrous fumarate (HEMOCYTE - 106 MG FE) 325 (106 FE) MG   TABS Take 1 tablet by mouth daily.    Yes Historical Provider, MD  fluticasone (FLONASE) 50 MCG/ACT nasal spray Place 2 sprays into the nose daily. allergies    Yes Historical Provider, MD  haloperidol lactate (HALDOL) 5 MG/ML injection Inject 1.5 mg into the muscle every 8 (eight) hours as needed (for agitation).   Yes Historical Provider, MD  HYDROcodone-acetaminophen (NORCO/VICODIN) 5-325 MG per tablet Take 1 tablet by mouth every 6 (six) hours as needed for moderate pain. pain   Yes Historical Provider, MD  ondansetron (ZOFRAN) 4 MG tablet  Take 4 mg by mouth every 8 (eight) hours as needed for nausea or vomiting.   Yes Historical Provider, MD  pantoprazole (PROTONIX) 40 MG tablet Take 1 tablet (40 mg total) by mouth daily before breakfast. 04/16/13  Yes Mahala Menghini, PA-C  QUEtiapine (SEROQUEL) 25 MG tablet Take 12.5-25 mg by mouth 2 (two) times daily. Takes one-half tablet at 10am and one tablet at bedtime   Yes Historical Provider, MD  QUEtiapine (SEROQUEL) 50 MG tablet Take 50 mg by mouth 3 (three) times daily.   Yes Historical Provider, MD  senna (SENOKOT) 8.6 MG TABS Take 3 tablets by mouth every morning.    Yes Historical Provider, MD  vitamin B-12 (CYANOCOBALAMIN) 1000 MCG tablet Take 1,000 mcg by mouth every morning.    Yes Historical Provider, MD    Current Facility-Administered Medications  Medication Dose Route Frequency Provider Last Rate Last Dose  . 0.9 %  sodium chloride infusion   Intravenous Continuous Bonnielee Haff, MD 100 mL/hr at 09/18/13 0121 1,000 mL at 09/18/13 0121  . acetaminophen (TYLENOL) tablet 650 mg  650 mg Oral Q6H PRN Bonnielee Haff, MD       Or  . acetaminophen (TYLENOL) suppository 650 mg  650 mg Rectal Q6H PRN Bonnielee Haff, MD      . acetaminophen (TYLENOL) tablet 500 mg  500 mg Oral BID Bonnielee Haff, MD   500 mg at 09/18/13 0232  . ALPRAZolam Duanne Moron) tablet 0.5 mg  0.5 mg Oral Q6H PRN Bonnielee Haff, MD   0.5 mg at 09/18/13 0232  . amLODipine (NORVASC) tablet 5 mg  5 mg Oral q morning - 10a Bonnielee Haff, MD      . atorvastatin (LIPITOR) tablet 10 mg  10 mg Oral QHS Bonnielee Haff, MD   10 mg at 09/18/13 0232  . docusate sodium (COLACE) capsule 100 mg  100 mg Oral BID Bonnielee Haff, MD   100 mg at 09/18/13 0232  . escitalopram (LEXAPRO) tablet 15 mg  15 mg Oral Daily Bonnielee Haff, MD      . haloperidol lactate (HALDOL) injection 2 mg  2 mg Intravenous Q6H PRN Bonnielee Haff, MD   2 mg at 09/18/13 0233  . heparin injection 5,000 Units  5,000 Units Subcutaneous 3 times per day Bonnielee Haff, MD   5,000 Units at 09/18/13 0233  . insulin aspart (novoLOG) injection 0-9 Units  0-9 Units Subcutaneous TID WC Rosita Fire, MD      . ondansetron (ZOFRAN) tablet 4 mg  4 mg Oral Q6H PRN Bonnielee Haff, MD       Or  . ondansetron (ZOFRAN) injection 4 mg  4 mg Intravenous Q6H PRN Bonnielee Haff, MD      . pantoprazole (PROTONIX) EC tablet 40 mg  40 mg Oral QAC breakfast Bonnielee Haff, MD      . QUEtiapine (SEROQUEL) tablet 50 mg  50 mg Oral TID Bonnielee Haff, MD      . [  START ON 09/19/2013] vancomycin (VANCOCIN) IVPB 750 mg/150 ml premix  750 mg Intravenous Q24H Tesfaye Fanta, MD        Allergies as of 09/17/2013  . (No Known Allergies)   Family History  Problem Relation Age of Onset  . Anesthesia problems Neg Hx   . Hypotension Neg Hx   . Malignant hyperthermia Neg Hx   . Pseudochol deficiency Neg Hx    History   Social History  . Marital Status: Widowed    Spouse Name: N/A    Number of Children: N/A  . Years of Education: N/A   Social History Main Topics  . Smoking status: Former Smoker -- 0.50 packs/day for 30 years    Types: Cigarettes    Quit date: 03/25/2003  . Smokeless tobacco: Not on file  . Alcohol Use: No  . Drug Use: No  . Sexual Activity: No     Review of Systems: LIMITED DUE TO PT COGNITIVE DYSFUNCTION  Vitals: Blood pressure 125/65, pulse 63, temperature 98 F (36.7 C), temperature source Oral, resp. rate 20, height 5' 3" (1.6 m), weight 154 lb 9.6 oz (70.126 kg), SpO2 93.00%.  Physical Exam: General:   AROUSABLEt,  pleasant and cooperative in NAD Head:  Normocephalic and atraumatic. Eyes:  Sclera clear, no icterus.   Conjunctiva pink. Mouth:  No deformity or lesions, dentition ABnormal. Lungs:  Clear throughout to auscultation.   No wheezes. No acute distress. Heart:  Regular rate and rhythm; no murmurs. Abdomen:  Soft, INITIALLY nontender, BUT C/O ABD PAIN WHEN ASKED DURING THE EXAM, nondistended. No masses noted. Normal bowel sounds,  without guarding, and without rebound.    Msk:  Symmetrical. Extremities:  Without edema. Neurologic:  Alert and  INTERACTIVE, NO  NEW FOCAL DEFICITS Psych:  Alert, AROUSABLE, FLAT affect.   Lab Results:  Recent Labs  09/17/13 1906 09/18/13 0453  WBC 11.7* 7.7  HGB 10.3* 8.7*  HCT 30.9* 26.1*  PLT 304 255   BMET  Recent Labs  09/17/13 1906 09/18/13 0453  NA 137 138  K 4.5 4.2  CL 98 103  CO2 26 25  GLUCOSE 134* 107*  BUN 30* 28*  CREATININE 1.82* 1.79*  CALCIUM 9.5 8.4   LFT  Recent Labs  09/18/13 0453  PROT 6.2  ALBUMIN 2.4*  AST 13  ALT 6  ALKPHOS 76  BILITOT 0.4     Studies/Results: CT ABD/PELVIS APR 25: NO ACUTE DISEASE, GALLSTONES, KIDNEY STONE  Impression: ADMITTED WITH GENERALIZED PAIN. EVALUATION UNREVEALING. HAS BEEN SEEN IN PAST FOR WEIGHT LOSS WHICH IS MOST LIKELY DUE TO ADVANCED DEMENTIA, LESS LIKELY GASTRIC CA OR H PYLORI GASTRITIS  Plan: 1. PLAN FOR EGD APR 26. DISCUSSED BENEFITS V. RISKS OF PROCEDURE WITH SON(WILLIAM)  2. SUPPORTIVE CARE 3. CONTINUE TO MONITOR SYMPTOMS. 4. DAILY PPI   LOS: 1 day   Hassen Bruun L Yazmen Briones  09/18/2013, 10:27 AM    

## 2013-09-19 NOTE — Op Note (Addendum)
[facility] [address] [place], [address]   ENDOSCOPY PROCEDURE REPORT  PATIENT: [name] [name], [name] [name]  MR#: [id number] BIRTHDATE: 05-08-1929 , 84  yrs. old GENDER: Female  ENDOSCOPIST: Barney Drain, MD REFERRED PI:RJJOACZY Fanta, M.D.  PROCEDURE DATE: 09/19/2013 PROCEDURE:   EGD w/ biopsy and SAVARY DILATION INDICATIONS:Weight loss. & ANOREXIA MEDICATIONS: Demerol 50 mg IV and Versed 3 mg IV TOPICAL ANESTHETIC:   Viscous Xylocaine  DESCRIPTION OF PROCEDURE:     Physical exam was performed.  Informed consent was obtained from the patient after explaining the benefits, risks, and alternatives to the procedure.  The patient was connected to the monitor and placed in the left lateral position.  Continuous oxygen was provided by nasal cannula and IV medicine administered through an indwelling cannula.  After administration of sedation, the patients esophagus was intubated and the EG-[id number] ([id number])  endoscope was advanced under direct visualization to the second portion of the duodenum.  The scope was removed slowly by carefully examining the color, texture, anatomy, and integrity of the mucosa on the way out.  The patient was recovered in endoscopy and discharged home in satisfactory condition.    ESOPHAGUS: POSSIBLE PROXIMAL ESOPHAGEAL WEB DILATED WITH SAVARY WFUXNATF(57-32 MM).  MILD TO MODERATE RESISTANCE.   STOMACH: Multiple small sessile polyps were found in the gastric body. Multiple biopsies was performed using cold forceps.   DUODENUM: The duodenal mucosa showed no abnormalities in the bulb and second portion of the duodenum. COMPLICATIONS:   None  ENDOSCOPIC IMPRESSION: 1.   Distal gastritis 2.   Gastric polyp(s) 3.   PROBABLE PROXIMAL ESOPHAGEAL WEB 4. ANOREXIA/WEIGHT LOSS MOST LIKELY DUE TO DEMENTIA BUT EXACERBATED BY GASTRITIS/PROBABLE ESOPHAGEAL WEB  RECOMMENDATIONS: ADVANCE DIET ENSURE PUDDING TID AWAIT BIOPSY RETSART HEPARIN SQ APR  27  REPEAT EXAM:   _______________________________ Lorrin MaisBarney Drain, MD 09/19/2013 10:16 AM Revised: 09/19/2013 10:16 AM

## 2013-09-20 DIAGNOSIS — R1084 Generalized abdominal pain: Secondary | ICD-10-CM

## 2013-09-20 DIAGNOSIS — G8929 Other chronic pain: Secondary | ICD-10-CM

## 2013-09-20 DIAGNOSIS — R109 Unspecified abdominal pain: Secondary | ICD-10-CM

## 2013-09-20 LAB — CBC
HEMATOCRIT: 30.8 % — AB (ref 36.0–46.0)
Hemoglobin: 9.9 g/dL — ABNORMAL LOW (ref 12.0–15.0)
MCH: 28.1 pg (ref 26.0–34.0)
MCHC: 32.1 g/dL (ref 30.0–36.0)
MCV: 87.5 fL (ref 78.0–100.0)
Platelets: 355 10*3/uL (ref 150–400)
RBC: 3.52 MIL/uL — ABNORMAL LOW (ref 3.87–5.11)
RDW: 13.1 % (ref 11.5–15.5)
WBC: 6.4 10*3/uL (ref 4.0–10.5)

## 2013-09-20 LAB — GLUCOSE, CAPILLARY
GLUCOSE-CAPILLARY: 98 mg/dL (ref 70–99)
GLUCOSE-CAPILLARY: 92 mg/dL (ref 70–99)
Glucose-Capillary: 198 mg/dL — ABNORMAL HIGH (ref 70–99)
Glucose-Capillary: 140 mg/dL — ABNORMAL HIGH (ref 70–99)

## 2013-09-20 LAB — BASIC METABOLIC PANEL
BUN: 19 mg/dL (ref 6–23)
CHLORIDE: 108 meq/L (ref 96–112)
CO2: 26 meq/L (ref 19–32)
Calcium: 9.1 mg/dL (ref 8.4–10.5)
Creatinine, Ser: 1.5 mg/dL — ABNORMAL HIGH (ref 0.50–1.10)
GFR calc Af Amer: 36 mL/min — ABNORMAL LOW (ref >90)
GFR calc non Af Amer: 31 mL/min — ABNORMAL LOW (ref >90)
GLUCOSE: 78 mg/dL (ref 70–99)
POTASSIUM: 4 meq/L (ref 3.7–5.3)
Sodium: 144 mEq/L (ref 137–147)

## 2013-09-20 NOTE — Clinical Social Work Psychosocial (Signed)
Clinical Social Work Department BRIEF PSYCHOSOCIAL ASSESSMENT 09/20/2013  Patient:  [name] [name], [name] [name]     Account Number:  [id number]     Admit date:  09/17/2013  Clinical Social Worker:  Daiva Huge  Date/Time:  09/20/2013 03:06 PM  Referred by:  Physician  Date Referred:  09/20/2013 Referred for  SNF Placement   Other Referral:   Interview type:  Other - See comment Other interview type:   Spoke with patient and family at bedside- [place] Living Status:  FACILITY Admitted from facility:  [facility] Level of care:  [place] Primary support name:  daughterConstance Holster Primary support relationship to patient:  FAMILY Degree of support available:   good    CURRENT CONCERNS Current Concerns  Post-Acute Placement   Other Concerns:    SOCIAL WORK ASSESSMENT / PLAN Patient admitted from [facility] where she has resided for about the last 9 years per daughter- plan is for return at d/c- confirmed this with SNF as well.   Assessment/plan status:  Other - See comment Other assessment/ plan:   FL2 update for SNF return   Information/referral to community resources:    PATIENT'S/FAMILY'S RESPONSE TO PLAN OF CARE: daughter reports her mother has had some abdominal pain and they are working her up for this- she is hopeful they can relieve her pain-  CSW will follow to assist with return to SNF.       Eduard Clos, MSW, [place]

## 2013-09-20 NOTE — Progress Notes (Signed)
ANTIBIOTIC CONSULT NOTE-follow up  Pharmacy Consult for Vancomycin Indication: Pneumonia   No Known Allergies  Patient Measurements: Height: 5\' 3"  (160 cm) Weight: 154 lb 9.6 oz (70.126 kg) IBW/kg (Calculated) : 52.4  Vital Signs: Temp: 97.2 F (36.2 C) (04/26 2319) Temp src: Oral (04/26 2319) BP: 110/45 mmHg (04/26 2319) Pulse Rate: 56 (04/26 2319)  Labs:  Recent Labs  09/17/13 1906 09/18/13 0453 09/19/13 0556 09/20/13 0508  WBC 11.7* 7.7  --  6.4  HGB 10.3* 8.7*  --  9.9*  PLT 304 255  --  355  CREATININE 1.82* 1.79* 1.62* 1.50*    Estimated Creatinine Clearance: 27.2 ml/min (by C-G formula based on Cr of 1.5).  No results found for this basename: VANCOTROUGH, VANCOPEAK, VANCORANDOM, GENTTROUGH, GENTPEAK, GENTRANDOM, TOBRATROUGH, TOBRAPEAK, TOBRARND, AMIKACINPEAK, AMIKACINTROU, AMIKACIN,  in the last 72 hours   Microbiology: Recent Results (from the past 720 hour(s))  CULTURE, BLOOD (ROUTINE X 2)     Status: None   Collection Time    09/17/13  7:01 PM      Result Value Ref Range Status   Specimen Description BLOOD BLOOD RIGHT ARM   Final   Special Requests BOTTLES DRAWN AEROBIC AND ANAEROBIC 6CC   Final   Culture NO GROWTH 2 DAYS   Final   Report Status PENDING   Incomplete  CULTURE, BLOOD (ROUTINE X 2)     Status: None   Collection Time    09/18/13  4:53 AM      Result Value Ref Range Status   Specimen Description BLOOD LEFT ARM   Final   Special Requests     Final   Value: BOTTLES DRAWN AEROBIC AND ANAEROBIC 10CC EACH BOTTLE   Culture NO GROWTH 2 DAYS   Final   Report Status PENDING   Incomplete   Medical History: Past Medical History  Diagnosis Date  . Chronic kidney disease     acute renal failure  . COPD (chronic obstructive pulmonary disease)   . Diabetes mellitus   . Depression   . DEMENTIA   . Hypertension   . Shortness of breath   . GERD (gastroesophageal reflux disease)   . Gallstones   . Diverticulosis   . Concern about mental  disorder without diagnosis   . Dehydration   . Anxiety   . Dysphagia   . Anemia   . Cholelithiases   . Renal stones   . Hyperlipidemia   . Hypokalemia   . Dementia    Medications:  Cefepime 1 Gm IV on admission  Assessment: 80 yo female nursing home resident admitted with abdominal pain, low-grade fever with abnormal chest x-ray showing possible left lower lobe infiltrate, recent UTI, and dehydration. Empiric antibiotics for HCAP. SCr slightly improved since admission.   Estimated Creatinine Clearance: 27.2 ml/min (by C-G formula based on Cr of 1.5).  Goal of Therapy:  Vancomycin troughs 15-20 mcg/ml  Plan:  Continue Vancomycin 750mg  IV q24hrs Check trough at steady state (before 5th dose) Monitor labs, renal fxn, and cultures  [employer], RPH 09/20/2013,11:18 AM .

## 2013-09-20 NOTE — Progress Notes (Signed)
    Subjective: Pleasantly confused. Resting in bed but easily awakens. Daughter at bedside. States she will eat, chew, and hold food in her mouth for a long time. Per daughter, no complaints of abdominal pain yesterday.   Objective: Vital signs in last 24 hours: Temp:  [97.2 F (36.2 C)-98.6 F (37 C)] 97.2 F (36.2 C) (04/26 2319) Pulse Rate:  [42-62] 56 (04/26 2319) Resp:  [14-20] 20 (04/26 2319) BP: (110-144)/(43-92) 110/45 mmHg (04/26 2319) SpO2:  [94 %-100 %] 94 % (04/26 2319) Last BM Date:  (unknown per daughter) General:   Resting with eyes closed but easily awakens. Confused.  Abdomen:  Bowel sounds present, soft, non-tender, non-distended. No HSM or hernias noted. No rebound or guarding.  Msk:  Symmetrical without gross deformities. Normal posture. Extremities:  Without clubbing or edema. Neuro: Confused  Intake/Output from previous day: 04/26 0701 - 04/27 0700 In: 639.2 [P.O.:440; I.V.:199.2] Out: 1650 [Urine:1650] Intake/Output this shift:    Lab Results:  Recent Labs  09/17/13 1906 09/18/13 0453 09/20/13 0508  WBC 11.7* 7.7 6.4  HGB 10.3* 8.7* 9.9*  HCT 30.9* 26.1* 30.8*  PLT 304 255 355   BMET  Recent Labs  09/18/13 0453 09/19/13 0556 09/20/13 0508  NA 138 139 144  K 4.2 4.3 4.0  CL 103 104 108  CO2 25 23 26   GLUCOSE 107* 83 78  BUN 28* 23 19  CREATININE 1.79* 1.62* 1.50*  CALCIUM 8.4 8.8 9.1   LFT  Recent Labs  09/17/13 1906 09/18/13 0453  PROT 7.7 6.2  ALBUMIN 3.1* 2.4*  AST 14 13  ALT 8 6  ALKPHOS 106 76  BILITOT 0.3 0.4     Studies/Results: Dg Shoulder Left Port  09/18/2013   CLINICAL DATA:  Left shoulder pain.  EXAM: PORTABLE LEFT SHOULDER - 2+ VIEW  COMPARISON:  Plain films left shoulder 07/05/2011.  FINDINGS: There is no acute bony or joint abnormality. Degenerative change is seen about the shoulder. Imaged left lung and ribs appear normal.  IMPRESSION: No acute finding.  Stable compared to prior exam.   Electronically  Signed   By: Inge Rise M.D.   On: 09/18/2013 18:01   US Abdomen Limited Ruq  09/18/2013   CLINICAL DATA:  Abdominal pain, gallstones  EXAM: US ABDOMEN LIMITED - RIGHT UPPER QUADRANT  COMPARISON:  CT abdomen pelvis dated 09/17/2013  FINDINGS: Gallbladder:  Multiple gallstones measuring up to 1.5 cm. No gallbladder wall thickening or pericholecystic fluid. Negative sonographic Murphy's sign.  Common bile duct:  Diameter: 5 mm.  Liver:  No focal lesion identified. Within normal limits in parenchymal echogenicity.  IMPRESSION: Cholelithiasis, without associated sonographic findings to suggest acute cholecystitis.   Electronically Signed   By: Julian Hy M.D.   On: 09/18/2013 10:07    Assessment: 80 year old female admitted with abdominal pain, back pain. EGD with possible proximal esophageal web s/p dilation, gastritis, gastric polyps. US abdomen with cholelithiasis but without acute findings. Likely dementia along with gastritis as culprit for anorexia and weight loss. Appears patient "holds" food in her mouth for lengths of time per daughter. Consider speech evaluation for dietary recommendations.    Plan: Follow-up on biopsies Ensure TID for supplementation Heparin subcutaneous may restart today Protonix daily Consider speech evaluation  Will sign off   Orvil Feil, ANP-BC [facility]     LOS: 3 days    09/20/2013, 8:01 AM

## 2013-09-20 NOTE — Progress Notes (Signed)
Pt pulled IV out. Threw Ativan syringe across the room. Hitting and uncooperative. New IV placed in Right Upper Arm. New vial of Ativan given per [facility]. Will continue to monitor.

## 2013-09-20 NOTE — Progress Notes (Signed)
REVIEWED.  

## 2013-09-20 NOTE — Progress Notes (Signed)
Subjective: Patient is resting. She remained confused and disoriented. No fever or chills.  Objective: Vital signs in last 24 hours: Temp:  [97.2 F (36.2 C)-98.6 F (37 C)] 97.2 F (36.2 C) (04/26 2319) Pulse Rate:  [42-62] 56 (04/26 2319) Resp:  [14-20] 20 (04/26 2319) BP: (110-144)/(43-92) 110/45 mmHg (04/26 2319) SpO2:  [94 %-100 %] 94 % (04/26 2319) Weight change:  Last BM Date:  (unknown per daughter)  Intake/Output from previous day: 04/26 0701 - 04/27 0700 In: 639.2 [P.O.:440; I.V.:199.2] Out: 1650 [Urine:1650]  PHYSICAL EXAM General appearance: no distress and slowed mentation Resp: diminished breath sounds bilaterally and rhonchi bilaterally Cardio: S1, S2 normal GI: soft, non-tender; bowel sounds normal; no masses,  no organomegaly Extremities: extremities normal, atraumatic, no cyanosis or edema  Lab Results:    @labtest @ [facility] No results found for this basename: PHART, PCO2, PO2ART, TCO2, HCO3,  in the last 72 hours CULTURES Recent Results (from the past 240 hour(s))  CULTURE, BLOOD (ROUTINE X 2)     Status: None   Collection Time    09/17/13  7:01 PM      Result Value Ref Range Status   Specimen Description BLOOD BLOOD RIGHT ARM   Final   Special Requests BOTTLES DRAWN AEROBIC AND ANAEROBIC 6CC   Final   Culture NO GROWTH 1 DAY   Final   Report Status PENDING   Incomplete  CULTURE, BLOOD (ROUTINE X 2)     Status: None   Collection Time    09/18/13  4:53 AM      Result Value Ref Range Status   Specimen Description BLOOD LEFT ARM   Final   Special Requests     Final   Value: BOTTLES DRAWN AEROBIC AND ANAEROBIC 10CC EACH BOTTLE   Culture NO GROWTH 1 DAY   Final   Report Status PENDING   Incomplete   Studies/Results: Dg Shoulder Left Port  09/18/2013   CLINICAL DATA:  Left shoulder pain.  EXAM: PORTABLE LEFT SHOULDER - 2+ VIEW  COMPARISON:  Plain films left shoulder 07/05/2011.  FINDINGS: There is no acute bony or joint abnormality. Degenerative  change is seen about the shoulder. Imaged left lung and ribs appear normal.  IMPRESSION: No acute finding.  Stable compared to prior exam.   Electronically Signed   By: Inge Rise M.D.   On: 09/18/2013 18:01   US Abdomen Limited Ruq  09/18/2013   CLINICAL DATA:  Abdominal pain, gallstones  EXAM: US ABDOMEN LIMITED - RIGHT UPPER QUADRANT  COMPARISON:  CT abdomen pelvis dated 09/17/2013  FINDINGS: Gallbladder:  Multiple gallstones measuring up to 1.5 cm. No gallbladder wall thickening or pericholecystic fluid. Negative sonographic Murphy's sign.  Common bile duct:  Diameter: 5 mm.  Liver:  No focal lesion identified. Within normal limits in parenchymal echogenicity.  IMPRESSION: Cholelithiasis, without associated sonographic findings to suggest acute cholecystitis.   Electronically Signed   By: Julian Hy M.D.   On: 09/18/2013 10:07    Medications: I have reviewed the patient's current medications.  Assesment: Principal Problem:   Abdominal pain Active Problems:   Dementia with behavioral disturbance   Dehydration   Pneumonia    Plan: Medications reviewed Will continue IV antibiotics Continue supportive care.   LOS: 3 days   Dore Oquin 09/20/2013, 7:18 AM

## 2013-09-20 NOTE — Care Management Note (Signed)
    Page 1 of 1   09/20/2013     4:07:41 PM CARE MANAGEMENT NOTE 09/20/2013  Patient:  [name] [name], [name] [name]   Account Number:  [id number]  Date Initiated:  09/20/2013  Documentation initiated by:  Vladimir Creeks  Subjective/Objective Assessment:   Admitted with abd pain low grade fever and probable pneumonia. Patient is from [place].     Action/Plan:   CSW aware.   Anticipated DC Date:  09/22/2013   Anticipated DC Plan:  SKILLED NURSING FACILITY  In-house referral  Clinical Social Worker      DC Planning Services  CM consult      Choice offered to / List presented to:             Status of service:  Completed, signed off Medicare Important Message given?   (If response is "NO", the following Medicare IM given date fields will be blank) Date Medicare IM given:   Date Additional Medicare IM given:    Discharge Disposition:  [place]  Per UR Regulation:  Reviewed for med. necessity/level of care/duration of stay  If discussed at [place] of Stay Meetings, dates discussed:    Comments:  09/20/13 [place] RN/CM

## 2013-09-21 ENCOUNTER — Encounter (HOSPITAL_COMMUNITY): Payer: Self-pay | Admitting: Gastroenterology

## 2013-09-21 LAB — GLUCOSE, CAPILLARY
Glucose-Capillary: 93 mg/dL (ref 70–99)
Glucose-Capillary: 145 mg/dL — ABNORMAL HIGH (ref 70–99)

## 2013-09-21 MED ORDER — LEVOFLOXACIN 500 MG PO TABS: 500.0000 mg | ORAL_TABLET | Freq: Every day | ORAL | Status: DC

## 2013-09-21 MED ORDER — BOOST / RESOURCE BREEZE PO LIQD: 1.0000 | Freq: Three times a day (TID) | ORAL | Status: AC

## 2013-09-21 NOTE — Clinical Social Work Note (Signed)
Patient ready for discharge and return to [facility] today, will transfer via [employer].  Daughter in room informed and agreeable.  Facility informed by VM, discharge summary faxed via [facility].  Discharge packet prepared and placed w shadow chart for transport.  EMS called and asked to transport approx 12:30 PM today.  CSW signing off as no further SW needs identified.  Edwyna Shell, LCSW Clinical Social Worker [phone])

## 2013-09-21 NOTE — Progress Notes (Signed)
Report called to [facility]. 

## 2013-09-21 NOTE — Discharge Summary (Signed)
Physician Discharge Summary  Patient ID: [name] [name] MRN: [id number] DOB/AGE: 78-09-1928 78 y.o. Primary Care Physician:Makaylynn Bonillas, MD Admit date: 09/17/2013 Discharge date: 09/21/2013    Discharge Diagnoses:   Principal Problem:   Abdominal pain Active Problems:   Dementia with behavioral disturbance   Dehydration   Pneumonia     Medication List         acetaminophen 500 MG tablet  Commonly known as:  TYLENOL  Take 500 mg by mouth 2 (two) times daily.     ALPRAZolam 0.5 MG tablet  Commonly known as:  XANAX  Take 0.5 mg by mouth every 6 (six) hours as needed for anxiety.     amLODipine 5 MG tablet  Commonly known as:  NORVASC  Take 5 mg by mouth every morning.     atorvastatin 10 MG tablet  Commonly known as:  LIPITOR  Take 10 mg by mouth at bedtime.     CALCARB 600/D 600-400 MG-UNIT per tablet  Generic drug:  Calcium Carbonate-Vitamin D  Take 1 tablet by mouth 2 (two) times daily.     cephALEXin 500 MG capsule  Commonly known as:  KEFLEX  Take 500 mg by mouth 2 (two) times daily. 7 day course starting on 09/09/2013     escitalopram 5 MG tablet  Commonly known as:  LEXAPRO  Take 15 mg by mouth daily.     feeding supplement (RESOURCE BREEZE) Liqd  Take 1 Container by mouth 3 (three) times daily between meals.     ferrous fumarate 325 (106 FE) MG Tabs tablet  Commonly known as:  HEMOCYTE - 106 mg FE  Take 1 tablet by mouth daily.     fluticasone 50 MCG/ACT nasal spray  Commonly known as:  FLONASE  Place 2 sprays into the nose daily. allergies     haloperidol lactate 5 MG/ML injection  Commonly known as:  HALDOL  Inject 1.5 mg into the muscle every 8 (eight) hours as needed (for agitation).     HYDROcodone-acetaminophen 5-325 MG per tablet  Commonly known as:  NORCO/VICODIN  Take 1 tablet by mouth every 6 (six) hours as needed for moderate pain. pain     levofloxacin 500 MG tablet  Commonly known as:  LEVAQUIN  Take 1 tablet (500 mg total) by  mouth daily.     ondansetron 4 MG tablet  Commonly known as:  ZOFRAN  Take 4 mg by mouth every 8 (eight) hours as needed for nausea or vomiting.     pantoprazole 40 MG tablet  Commonly known as:  PROTONIX  Take 1 tablet (40 mg total) by mouth daily before breakfast.     QUEtiapine 25 MG tablet  Commonly known as:  SEROQUEL  Take 12.5-25 mg by mouth 2 (two) times daily. Takes one-half tablet at 10am and one tablet at bedtime     QUEtiapine 50 MG tablet  Commonly known as:  SEROQUEL  Take 50 mg by mouth 3 (three) times daily.     senna 8.6 MG Tabs tablet  Commonly known as:  SENOKOT  Take 3 tablets by mouth every morning.     vitamin B-12 1000 MCG tablet  Commonly known as:  CYANOCOBALAMIN  Take 1,000 mcg by mouth every morning.        Discharged Condition: improved    Consults: GI  Significant Diagnostic Studies: Ct Abdomen Pelvis Wo Contrast  09/17/2013   CLINICAL DATA:  Right flank pain, known kidney stone, history anxiety, dementia, acute renal failure, COPD,  diabetes, hypertension  EXAM: CT ABDOMEN AND PELVIS WITHOUT CONTRAST  TECHNIQUE: Multidetector CT imaging of the abdomen and pelvis was performed following the standard protocol without intravenous contrast.  COMPARISON:  03/03/2013  FINDINGS: Small left pleural effusion and associated left lower lobe atelectasis.  Scattered atherosclerotic calcifications aorta and coronary arteries with minimal pericardial fluid.  Dependent 12 mm diameter gallstone in gallbladder.  IVC filter.  Nonobstructing calculus at inferior pole right kidney 9 mm diameter image 44.  Minimal dilatation of the right renal collecting system and proximal right ureter without identification of a ureteral calculus.  Within limits of a nonenhanced exam, no focal abnormalities of the liver, spleen, pancreas, left kidney, or adrenal glands.  Sigmoid diverticulosis without evidence of diverticulitis.  Stomach and bowel loops otherwise unremarkable for  technique.  Uterus surgically absent with nonvisualization of the ovaries and appendix.  Low descent of ,bladder base in pelvis likely representing a cystocele.  No mass, adenopathy, free fluid or inflammatory process.  Bones demineralized with scattered degenerative disc disease changes of the thoracolumbar spine.  IMPRESSION: Nonobstructing 9 mm calculus at lower pole right kidney.  Minimal dilatation of the right renal collecting system and proximal right ureter though no ureteral calculus is evident.  Probable cystocele.  Sigmoid diverticulosis without evidence of diverticulitis.  Cholelithiasis.  Small left pleural effusion and basilar atelectasis.   Electronically Signed   By: Lavonia Dana M.D.   On: 09/17/2013 20:37   Dg Chest Portable 1 View  09/17/2013   CLINICAL DATA:  Chest pain.  EXAM: PORTABLE CHEST - 1 VIEW  COMPARISON:  05/01/2013  FINDINGS: The patient is mildly rotated to the right. Cardiomediastinal silhouette is grossly unchanged. Lungs remain hypoinflated. There is new patchy opacity in the left lung base with partial obscuration of the left hemidiaphragm. Right lung remains grossly clear. No definite pleural effusion or pneumothorax is identified. No acute osseous abnormality is seen. Multilevel osteophytosis is present in the thoracic spine.  IMPRESSION: New infiltrate in the left lung base could reflect pneumonia.   Electronically Signed   By: Logan Bores   On: 09/17/2013 18:38   Dg Shoulder Left Port  09/18/2013   CLINICAL DATA:  Left shoulder pain.  EXAM: PORTABLE LEFT SHOULDER - 2+ VIEW  COMPARISON:  Plain films left shoulder 07/05/2011.  FINDINGS: There is no acute bony or joint abnormality. Degenerative change is seen about the shoulder. Imaged left lung and ribs appear normal.  IMPRESSION: No acute finding.  Stable compared to prior exam.   Electronically Signed   By: Inge Rise M.D.   On: 09/18/2013 18:01   US Abdomen Limited Ruq  09/18/2013   CLINICAL DATA:  Abdominal  pain, gallstones  EXAM: US ABDOMEN LIMITED - RIGHT UPPER QUADRANT  COMPARISON:  CT abdomen pelvis dated 09/17/2013  FINDINGS: Gallbladder:  Multiple gallstones measuring up to 1.5 cm. No gallbladder wall thickening or pericholecystic fluid. Negative sonographic Murphy's sign.  Common bile duct:  Diameter: 5 mm.  Liver:  No focal lesion identified. Within normal limits in parenchymal echogenicity.  IMPRESSION: Cholelithiasis, without associated sonographic findings to suggest acute cholecystitis.   Electronically Signed   By: Julian Hy M.D.   On: 09/18/2013 10:07    Lab Results: Basic Metabolic Panel:  Recent Labs  09/19/13 0556 09/20/13 0508  NA 139 144  K 4.3 4.0  CL 104 108  CO2 23 26  GLUCOSE 83 78  BUN 23 19  CREATININE 1.62* 1.50*  CALCIUM 8.8 9.1  Liver Function Tests: No results found for this basename: AST, ALT, ALKPHOS, BILITOT, PROT, ALBUMIN,  in the last 72 hours   CBC:  Recent Labs  09/20/13 0508  WBC 6.4  HGB 9.9*  HCT 30.8*  MCV 87.5  PLT 355    Recent Results (from the past 240 hour(s))  CULTURE, BLOOD (ROUTINE X 2)     Status: None   Collection Time    09/17/13  7:01 PM      Result Value Ref Range Status   Specimen Description BLOOD BLOOD RIGHT ARM   Final   Special Requests BOTTLES DRAWN AEROBIC AND ANAEROBIC 6CC   Final   Culture NO GROWTH 2 DAYS   Final   Report Status PENDING   Incomplete  CULTURE, BLOOD (ROUTINE X 2)     Status: None   Collection Time    09/18/13  4:53 AM      Result Value Ref Range Status   Specimen Description BLOOD LEFT ARM   Final   Special Requests     Final   Value: BOTTLES DRAWN AEROBIC AND ANAEROBIC 10CC EACH BOTTLE   Culture NO GROWTH 2 DAYS   Final   Report Status PENDING   Incomplete     Hospital Course:  This is an 80 years old female patient with history of multiple medical illnesses was admitted due to abdominal pain. Patient was seen by GI and under went EDG which showed esophageal web (s/p  dilatation) and gastritis. Her CT scan of the abdomen should gall bladder stone and non obstructing renal stne. Her chest x-ray showed left base infiltrate. Patient was treated for pneumonia. Over she improved.   Discharge Exam: Blood pressure 129/63, pulse 44, temperature 100.3 F (37.9 C), temperature source Oral, resp. rate 20, height 5\' 3"  (1.6 m), weight 70.126 kg (154 lb 9.6 oz), SpO2 95.00%.   Disposition:  [facility].     Signed: Rosita Fire   09/21/2013, 7:24 AM

## 2013-09-23 LAB — CULTURE, BLOOD (ROUTINE X 2)
Culture: NO GROWTH
Culture: NO GROWTH

## 2013-09-27 ENCOUNTER — Other Ambulatory Visit: Payer: Self-pay | Admitting: Urology

## 2013-09-27 DIAGNOSIS — N200 Calculus of kidney: Secondary | ICD-10-CM

## 2013-09-28 ENCOUNTER — Telehealth: Payer: Self-pay | Admitting: Gastroenterology

## 2013-09-28 NOTE — Telephone Encounter (Signed)
Please call pt's family. HER stomach Bx shows gastritis & benign stomach polyp. CONTINUE PROTONIX. TAKE 30 MINUTES PRIOR TO BREAKFAST.

## 2013-09-29 NOTE — Telephone Encounter (Signed)
Called and informed pt's nurse, Izora Gala.

## 2013-09-29 NOTE — Telephone Encounter (Signed)
Called and informed [name] [name].

## 2013-09-29 NOTE — Telephone Encounter (Signed)
Called and informed daughter, Murlean Hark. She said to call [facility] and let them know.  She also wants to know if pt's results are available on the H. Pylori.

## 2013-09-29 NOTE — Telephone Encounter (Signed)
PLEASE CALL PT'S DAUGHTER. SHE DOES NOT HAVE H PYLORI INFECTION.

## 2013-10-19 ENCOUNTER — Other Ambulatory Visit: Payer: Self-pay | Admitting: Urology

## 2013-10-19 DIAGNOSIS — N200 Calculus of kidney: Secondary | ICD-10-CM

## 2013-10-21 ENCOUNTER — Ambulatory Visit (HOSPITAL_COMMUNITY)
Admission: RE | Admit: 2013-10-21 | Discharge: 2013-10-21 | Disposition: A | Discharge status: Home / Self Care | Payer: PRIVATE HEALTH INSURANCE | Source: Ambulatory Visit | Attending: Urology | Admitting: Urology

## 2013-10-21 ENCOUNTER — Other Ambulatory Visit: Payer: Self-pay | Admitting: Urology

## 2013-10-21 DIAGNOSIS — N200 Calculus of kidney: Secondary | ICD-10-CM | POA: Insufficient documentation

## 2013-10-22 ENCOUNTER — Ambulatory Visit: Payer: PRIVATE HEALTH INSURANCE | Admitting: Urology

## 2013-10-24 ENCOUNTER — Inpatient Hospital Stay (HOSPITAL_COMMUNITY)
Admission: EM | Admit: 2013-10-24 | Discharge: 2013-11-01 | DRG: 689 | Disposition: A | Discharge status: Skilled Nursing Facility | Payer: PRIVATE HEALTH INSURANCE | Attending: Internal Medicine | Admitting: Internal Medicine

## 2013-10-24 ENCOUNTER — Emergency Department (HOSPITAL_COMMUNITY): Payer: PRIVATE HEALTH INSURANCE

## 2013-10-24 ENCOUNTER — Encounter (HOSPITAL_COMMUNITY): Payer: Self-pay | Admitting: Emergency Medicine

## 2013-10-24 DIAGNOSIS — R109 Unspecified abdominal pain: Secondary | ICD-10-CM

## 2013-10-24 DIAGNOSIS — D649 Anemia, unspecified: Secondary | ICD-10-CM | POA: Diagnosis present

## 2013-10-24 DIAGNOSIS — E876 Hypokalemia: Secondary | ICD-10-CM | POA: Diagnosis present

## 2013-10-24 DIAGNOSIS — R4182 Altered mental status, unspecified: Secondary | ICD-10-CM

## 2013-10-24 DIAGNOSIS — Z79899 Other long term (current) drug therapy: Secondary | ICD-10-CM

## 2013-10-24 DIAGNOSIS — R748 Abnormal levels of other serum enzymes: Secondary | ICD-10-CM | POA: Diagnosis present

## 2013-10-24 DIAGNOSIS — Z66 Do not resuscitate: Secondary | ICD-10-CM | POA: Diagnosis present

## 2013-10-24 DIAGNOSIS — N179 Acute kidney failure, unspecified: Secondary | ICD-10-CM | POA: Diagnosis present

## 2013-10-24 DIAGNOSIS — Z87442 Personal history of urinary calculi: Secondary | ICD-10-CM

## 2013-10-24 DIAGNOSIS — F0391 Unspecified dementia with behavioral disturbance: Secondary | ICD-10-CM | POA: Diagnosis present

## 2013-10-24 DIAGNOSIS — F411 Generalized anxiety disorder: Secondary | ICD-10-CM | POA: Diagnosis present

## 2013-10-24 DIAGNOSIS — J4489 Other specified chronic obstructive pulmonary disease: Secondary | ICD-10-CM | POA: Diagnosis present

## 2013-10-24 DIAGNOSIS — Z87891 Personal history of nicotine dependence: Secondary | ICD-10-CM

## 2013-10-24 DIAGNOSIS — E860 Dehydration: Secondary | ICD-10-CM

## 2013-10-24 DIAGNOSIS — E119 Type 2 diabetes mellitus without complications: Secondary | ICD-10-CM | POA: Diagnosis present

## 2013-10-24 DIAGNOSIS — K219 Gastro-esophageal reflux disease without esophagitis: Secondary | ICD-10-CM | POA: Diagnosis present

## 2013-10-24 DIAGNOSIS — N189 Chronic kidney disease, unspecified: Secondary | ICD-10-CM | POA: Diagnosis present

## 2013-10-24 DIAGNOSIS — J189 Pneumonia, unspecified organism: Secondary | ICD-10-CM | POA: Diagnosis present

## 2013-10-24 DIAGNOSIS — J449 Chronic obstructive pulmonary disease, unspecified: Secondary | ICD-10-CM | POA: Diagnosis present

## 2013-10-24 DIAGNOSIS — R059 Cough, unspecified: Secondary | ICD-10-CM

## 2013-10-24 DIAGNOSIS — R05 Cough: Secondary | ICD-10-CM

## 2013-10-24 DIAGNOSIS — F03918 Unspecified dementia, unspecified severity, with other behavioral disturbance: Secondary | ICD-10-CM | POA: Diagnosis present

## 2013-10-24 DIAGNOSIS — E785 Hyperlipidemia, unspecified: Secondary | ICD-10-CM | POA: Diagnosis present

## 2013-10-24 DIAGNOSIS — I129 Hypertensive chronic kidney disease with stage 1 through stage 4 chronic kidney disease, or unspecified chronic kidney disease: Secondary | ICD-10-CM | POA: Diagnosis present

## 2013-10-24 DIAGNOSIS — E870 Hyperosmolality and hypernatremia: Secondary | ICD-10-CM | POA: Diagnosis present

## 2013-10-24 DIAGNOSIS — N390 Urinary tract infection, site not specified: Principal | ICD-10-CM | POA: Diagnosis present

## 2013-10-24 DIAGNOSIS — K8689 Other specified diseases of pancreas: Secondary | ICD-10-CM | POA: Diagnosis present

## 2013-10-24 LAB — COMPREHENSIVE METABOLIC PANEL
ALBUMIN: 3.1 g/dL — AB (ref 3.5–5.2)
ALK PHOS: 120 U/L — AB (ref 39–117)
ALT: 5 U/L (ref 0–35)
AST: 11 U/L (ref 0–37)
BILIRUBIN TOTAL: 0.5 mg/dL (ref 0.3–1.2)
BUN: 57 mg/dL — ABNORMAL HIGH (ref 6–23)
CO2: 19 meq/L (ref 19–32)
CREATININE: 2.33 mg/dL — AB (ref 0.50–1.10)
Calcium: 9.6 mg/dL (ref 8.4–10.5)
Chloride: 113 mEq/L — ABNORMAL HIGH (ref 96–112)
GFR calc Af Amer: 21 mL/min — ABNORMAL LOW (ref >90)
GFR, EST NON AFRICAN AMERICAN: 18 mL/min — AB (ref >90)
Glucose, Bld: 181 mg/dL — ABNORMAL HIGH (ref 70–99)
POTASSIUM: 4.1 meq/L (ref 3.7–5.3)
SODIUM: 149 meq/L — AB (ref 137–147)
Total Protein: 8 g/dL (ref 6.0–8.3)

## 2013-10-24 LAB — URINALYSIS, ROUTINE W REFLEX MICROSCOPIC
Bilirubin Urine: NEGATIVE
GLUCOSE, UA: NEGATIVE mg/dL
KETONES UR: NEGATIVE mg/dL
Nitrite: NEGATIVE
PH: 5.5 (ref 5.0–8.0)
Protein, ur: 30 mg/dL — AB
Specific Gravity, Urine: 1.025 (ref 1.005–1.030)
Urobilinogen, UA: 0.2 mg/dL (ref 0.0–1.0)

## 2013-10-24 LAB — CBC WITH DIFFERENTIAL/PLATELET
BASOS PCT: 0 % (ref 0–1)
Basophils Absolute: 0 10*3/uL (ref 0.0–0.1)
EOS PCT: 0 % (ref 0–5)
Eosinophils Absolute: 0 10*3/uL (ref 0.0–0.7)
HEMATOCRIT: 28.9 % — AB (ref 36.0–46.0)
HEMOGLOBIN: 9.5 g/dL — AB (ref 12.0–15.0)
Lymphocytes Relative: 11 % — ABNORMAL LOW (ref 12–46)
Lymphs Abs: 1 10*3/uL (ref 0.7–4.0)
MCH: 28.4 pg (ref 26.0–34.0)
MCHC: 32.9 g/dL (ref 30.0–36.0)
MCV: 86.5 fL (ref 78.0–100.0)
MONO ABS: 1.4 10*3/uL — AB (ref 0.1–1.0)
Monocytes Relative: 15 % — ABNORMAL HIGH (ref 3–12)
NEUTROS ABS: 7 10*3/uL (ref 1.7–7.7)
Neutrophils Relative %: 74 % (ref 43–77)
Platelets: 265 10*3/uL (ref 150–400)
RBC: 3.34 MIL/uL — ABNORMAL LOW (ref 3.87–5.11)
RDW: 15.3 % (ref 11.5–15.5)
WBC: 9.4 10*3/uL (ref 4.0–10.5)

## 2013-10-24 LAB — URINE MICROSCOPIC-ADD ON

## 2013-10-24 LAB — LACTIC ACID, PLASMA: LACTIC ACID, VENOUS: 1.2 mmol/L (ref 0.5–2.2)

## 2013-10-24 LAB — LIPASE, BLOOD: Lipase: 463 U/L — ABNORMAL HIGH (ref 11–59)

## 2013-10-24 LAB — PRO B NATRIURETIC PEPTIDE: Pro B Natriuretic peptide (BNP): 686.3 pg/mL — ABNORMAL HIGH (ref 0–450)

## 2013-10-24 LAB — TROPONIN I: Troponin I: <0.3 ng/mL (ref <0.30)

## 2013-10-24 MED ORDER — SODIUM CHLORIDE 0.9 % IV BOLUS (SEPSIS)
250.0000 mL | Freq: Once | INTRAVENOUS | Status: AC
  Administered 2013-10-24: 250 mL via INTRAVENOUS

## 2013-10-24 MED ORDER — DEXTROSE 5 % IV SOLN
1.0000 g | Freq: Once | INTRAVENOUS | Status: DC
  Administered 2013-10-25: 1 g via INTRAVENOUS
  Filled 2013-10-24: qty 10

## 2013-10-24 MED ORDER — SODIUM CHLORIDE 0.9 % IV SOLN
INTRAVENOUS | Status: DC
  Administered 2013-10-25: 02:00:00 via INTRAVENOUS

## 2013-10-24 MED ORDER — SODIUM CHLORIDE 0.9 % IV SOLN: INTRAVENOUS | Status: DC

## 2013-10-24 MED ORDER — SODIUM CHLORIDE 0.9 % IV BOLUS (SEPSIS)
250.0000 mL | Freq: Once | INTRAVENOUS | Status: AC
  Administered 2013-10-25: 250 mL via INTRAVENOUS

## 2013-10-24 NOTE — ED Provider Notes (Signed)
CSN: [id number]     Arrival date & time 10/24/13  1958 History  This chart was scribed for [name] Sorrow, MD by Randa Evens, ED Scribe. This patient was seen in room APA04/[facility]04 and the patient's care was started at 11:53 PM.     Chief Complaint  Patient presents with  . Cough   Level 5 Caveat: Altered Mental Status/ Dementia  Patient is a 80 y.o. female presenting with cough. The history is provided by the patient and a relative. No language interpreter was used.  Cough Relieved by:  None tried Worsened by:  Nothing tried Ineffective treatments:  None tried Associated symptoms: fever    HPI Comments: [name] [name] is a 80 y.o. female who presents to the Emergency Department complaining of change of mental status worsened earlier today. Associated cough, and fever . No history of injury prior to arrival. Denies h/o ankle swelling  Past Medical History  Diagnosis Date  . Chronic kidney disease     acute renal failure  . COPD (chronic obstructive pulmonary disease)   . Diabetes mellitus   . Depression   . DEMENTIA   . Hypertension   . Shortness of breath   . GERD (gastroesophageal reflux disease)   . Gallstones   . Diverticulosis   . Concern about mental disorder without diagnosis   . Dehydration   . Anxiety   . Dysphagia   . Anemia   . Cholelithiases   . Renal stones   . Hyperlipidemia   . Hypokalemia   . Dementia    Past Surgical History  Procedure Laterality Date  . Abdominal hysterectomy    . Cystoscopy  2008    with stent placement  . Extracorporeal shock wave lithotripsy  03/13/2011    Procedure: EXTRACORPOREAL SHOCK WAVE LITHOTRIPSY (ESWL);  Surgeon: Marissa Nestle;  Location: [facility];  Service: Urology;  Laterality: Right;  Right Renal Calculus  . Cystoscopy with litholapaxy  03/28/2011    Procedure: CYSTOSCOPY WITH LITHOLAPAXY;  Surgeon: Marissa Nestle;  Location: [facility];  Service: Urology;  Laterality: Right;  . Cystoscopy w/ ureteral stent  placement  03/28/2011    Procedure: CYSTOSCOPY WITH RETROGRADE PYELOGRAM/URETERAL STENT PLACEMENT;  Surgeon: Marissa Nestle;  Location: [facility];  Service: Urology;  Laterality: Right;  . Extracorporeal shock wave lithotripsy  09/25/2011    Procedure: EXTRACORPOREAL SHOCK WAVE LITHOTRIPSY (ESWL);  Surgeon: Marissa Nestle, MD;  Location: [facility];  Service: Urology;  Laterality: Right;  ESWL Right Renal Calculus  . Esophagogastroduodenoscopy  03/19/2005    CNO:[id number] antral gastritis, need to rule out H pylori infection/Pyloric channel ulcer without stigmata of bleed/Bulbar ulcer with duodenitis without stigmata of bleed  . Esophagogastroduodenoscopy N/A 09/19/2013    Procedure: ESOPHAGOGASTRODUODENOSCOPY (EGD);  Surgeon: Danie Binder, MD;  Location: [facility];  Service: Endoscopy;  Laterality: N/A;  . Esophageal dilation  09/19/2013    Procedure: ESOPHAGEAL DILATION;  Surgeon: Danie Binder, MD;  Location: [facility];  Service: Endoscopy;;   Family History  Problem Relation Age of Onset  . Anesthesia problems Neg Hx   . Hypotension Neg Hx   . Malignant hyperthermia Neg Hx   . Pseudochol deficiency Neg Hx    History  Substance Use Topics  . Smoking status: Former Smoker -- 0.50 packs/day for 30 years    Types: Cigarettes    Quit date: 03/25/2003  . Smokeless tobacco: Not on file  . Alcohol Use: No   OB History  Grav Para Term Preterm Abortions TAB SAB Ect Mult Living                 Review of Systems  Constitutional: Positive for fever.  Respiratory: Positive for cough.   Psychiatric/Behavioral: Positive for confusion.      Allergies  Review of patient's allergies indicates no known allergies.  Home Medications   Prior to Admission medications   Medication Sig Start Date End Date Taking? Authorizing Provider  acetaminophen (TYLENOL) 500 MG tablet Take 500 mg by mouth 2 (two) times daily.   Yes Historical Provider, MD  ALPRAZolam Duanne Moron) 0.5 MG tablet Take 0.5 mg  by mouth every 6 (six) hours as needed for anxiety.    Yes Historical Provider, MD  amLODipine (NORVASC) 5 MG tablet Take 5 mg by mouth every morning.    Yes Historical Provider, MD  atorvastatin (LIPITOR) 10 MG tablet Take 10 mg by mouth at bedtime.    Yes Historical Provider, MD  Calcium Carbonate-Vitamin D (CALCARB 600/D) 600-400 MG-UNIT per tablet Take 1 tablet by mouth 2 (two) times daily.   Yes Historical Provider, MD  escitalopram (LEXAPRO) 5 MG tablet Take 15 mg by mouth daily.   Yes Historical Provider, MD  feeding supplement, RESOURCE BREEZE, (RESOURCE BREEZE) LIQD Take 1 Container by mouth 3 (three) times daily between meals. 09/21/13  Yes Rosita Fire, MD  ferrous sulfate 325 (65 FE) MG tablet Take 325 mg by mouth every morning.   Yes Historical Provider, MD  HYDROcodone-acetaminophen (NORCO/VICODIN) 5-325 MG per tablet Take 1 tablet by mouth every 6 (six) hours as needed for moderate pain. pain   Yes Historical Provider, MD  ondansetron (ZOFRAN) 4 MG tablet Take 4 mg by mouth every 8 (eight) hours as needed for nausea or vomiting.   Yes Historical Provider, MD  pantoprazole (PROTONIX) 40 MG tablet Take 1 tablet (40 mg total) by mouth daily before breakfast. 04/16/13  Yes Mahala Menghini, PA-C  QUEtiapine (SEROQUEL) 200 MG tablet Take 200 mg by mouth.   Yes Historical Provider, MD  sennosides-docusate sodium (SENOKOT-S) 8.6-50 MG tablet Take 2 tablets by mouth 2 (two) times daily.   Yes Historical Provider, MD  vitamin B-12 (CYANOCOBALAMIN) 1000 MCG tablet Take 1,000 mcg by mouth every morning.    Yes Historical Provider, MD  fluticasone (FLONASE) 50 MCG/ACT nasal spray Place 2 sprays into the nose daily. allergies     Historical Provider, MD  haloperidol lactate (HALDOL) 5 MG/ML injection Inject 1.5 mg into the muscle every 8 (eight) hours as needed (for agitation).    Historical Provider, MD   Triage Vitals: BP 148/88  Pulse 95  Resp 24  SpO2 97%  Physical Exam  Nursing note and  vitals reviewed. Constitutional: She is oriented to person, place, and time. She appears well-developed and well-nourished.  HENT:  Head: Normocephalic and atraumatic.  Eyes: EOM are normal.  Neck: Normal range of motion.  Cardiovascular: Normal rate.   Pulmonary/Chest: Effort normal. She has rhonchi.  bilateral rhonchi  Musculoskeletal: Normal range of motion.  Neurological: She is alert and oriented to person, place, and time. No cranial nerve deficit. She exhibits normal muscle tone. Coordination normal.  Skin: Skin is warm and dry.  Psychiatric: She has a normal mood and affect. Her behavior is normal.    ED Course  Procedures (including critical care time) DIAGNOSTIC STUDIES: Oxygen Saturation is 97% on RA, adequate by my interpretation.    COORDINATION OF CARE:   Results for orders  placed during the hospital encounter of 10/24/13  LACTIC ACID, PLASMA      Result Value Ref Range   Lactic Acid, Venous 1.2  0.5 - 2.2 mmol/L  COMPREHENSIVE METABOLIC PANEL      Result Value Ref Range   Sodium 149 (*) 137 - 147 mEq/L   Potassium 4.1  3.7 - 5.3 mEq/L   Chloride 113 (*) 96 - 112 mEq/L   CO2 19  19 - 32 mEq/L   Glucose, Bld 181 (*) 70 - 99 mg/dL   BUN 57 (*) 6 - 23 mg/dL   Creatinine, Ser 2.33 (*) 0.50 - 1.10 mg/dL   Calcium 9.6  8.4 - 10.5 mg/dL   Total Protein 8.0  6.0 - 8.3 g/dL   Albumin 3.1 (*) 3.5 - 5.2 g/dL   AST 11  0 - 37 U/L   ALT 5  0 - 35 U/L   Alkaline Phosphatase 120 (*) 39 - 117 U/L   Total Bilirubin 0.5  0.3 - 1.2 mg/dL   GFR calc non Af Amer 18 (*) >90 mL/min   GFR calc Af Amer 21 (*) >90 mL/min  LIPASE, BLOOD      Result Value Ref Range   Lipase 463 (*) 11 - 59 U/L  URINALYSIS, ROUTINE W REFLEX MICROSCOPIC      Result Value Ref Range   Color, Urine YELLOW  YELLOW   APPearance CLOUDY (*) CLEAR   Specific Gravity, Urine 1.025  1.005 - 1.030   pH 5.5  5.0 - 8.0   Glucose, UA NEGATIVE  NEGATIVE mg/dL   Hgb urine dipstick SMALL (*) NEGATIVE    Bilirubin Urine NEGATIVE  NEGATIVE   Ketones, ur NEGATIVE  NEGATIVE mg/dL   Protein, ur 30 (*) NEGATIVE mg/dL   Urobilinogen, UA 0.2  0.0 - 1.0 mg/dL   Nitrite NEGATIVE  NEGATIVE   Leukocytes, UA MODERATE (*) NEGATIVE  CBC WITH DIFFERENTIAL      Result Value Ref Range   WBC 9.4  4.0 - 10.5 K/uL   RBC 3.34 (*) 3.87 - 5.11 MIL/uL   Hemoglobin 9.5 (*) 12.0 - 15.0 g/dL   HCT 28.9 (*) 36.0 - 46.0 %   MCV 86.5  78.0 - 100.0 fL   MCH 28.4  26.0 - 34.0 pg   MCHC 32.9  30.0 - 36.0 g/dL   RDW 15.3  11.5 - 15.5 %   Platelets 265  150 - 400 K/uL   Neutrophils Relative % 74  43 - 77 %   Neutro Abs 7.0  1.7 - 7.7 K/uL   Lymphocytes Relative 11 (*) 12 - 46 %   Lymphs Abs 1.0  0.7 - 4.0 K/uL   Monocytes Relative 15 (*) 3 - 12 %   Monocytes Absolute 1.4 (*) 0.1 - 1.0 K/uL   Eosinophils Relative 0  0 - 5 %   Eosinophils Absolute 0.0  0.0 - 0.7 K/uL   Basophils Relative 0  0 - 1 %   Basophils Absolute 0.0  0.0 - 0.1 K/uL  PRO B NATRIURETIC PEPTIDE      Result Value Ref Range   Pro B Natriuretic peptide (BNP) 686.3 (*) 0 - 450 pg/mL  TROPONIN I      Result Value Ref Range   Troponin I <0.30  <0.30 ng/mL  URINE MICROSCOPIC-ADD ON      Result Value Ref Range   Squamous Epithelial / LPF FEW (*) RARE   WBC, UA 21-50  <3 WBC/hpf  RBC / HPF 3-6  <3 RBC/hpf   Bacteria, UA MANY (*) RARE   Dg Chest 1 View  10/24/2013   CLINICAL DATA:  Fever, cough and congestion  EXAM: CHEST - 1 VIEW  COMPARISON:  09/17/2013  FINDINGS: Cardiac silhouette is normal in size. The aorta is uncoiled. No mediastinal or hilar masses. No evidence of adenopathy.  Lungs are clear.  No pleural effusion.  No pneumothorax.  Bony thorax is demineralized but intact.  IMPRESSION: No acute cardiopulmonary disease.   Electronically Signed   By: Lajean Manes M.D.   On: 10/24/2013 20:25   Dg Abd 1 View  10/21/2013   CLINICAL DATA:  80 year old female with renal calculi.  EXAM: ABDOMEN - 1 VIEW  COMPARISON:  09/17/2013 CT.  12/30/2012  and prior radiographs  FINDINGS: The 9 mm right lower pole calculus identified on recent CT is difficult to visualize on this study, likely secondary to overlying bowel gas and stool, unless intervention has been performed.  No other suspicious calcifications are identified, specifically along the courses of the ureters.  The bowel gas pattern is unremarkable.  An IVC filter and degenerative changes within the lumbar spine or again noted.  IMPRESSION: 9 mm right lower pole calculus identified on recent CT is difficult to visualize on this study.  No other suspicious calcifications identified.  Unremarkable bowel gas pattern   Electronically Signed   By: Hassan Rowan M.D.   On: 10/21/2013 09:13   US Renal  10/21/2013   CLINICAL DATA:  Nephrolithiasis and possible hydronephrosis.  EXAM: RENAL/URINARY TRACT ULTRASOUND COMPLETE  COMPARISON:  09/17/2013  FINDINGS: Right Kidney:  Length: 8.3 cm in length. No hydronephrosis. Normal cortical echogenicity. Moderate cortical thinning. 9 mm hyperechoic focus within the medullary portion of the lower pole is nonspecific. No shadowing.  Left Kidney:  Length: 8.8 cm in length. Echogenicity within normal limits. No mass or hydronephrosis visualized. Moderate cortical thinning.  Bladder:  Decompressed.  IMPRESSION: 9 mm hyperechoic focus in the lower pole of the right kidney without shadowing likely corresponds to the patient's known calculus.  No hydronephrosis.  Chronic changes.   Electronically Signed   By: Maryclare Bean M.D.   On: 10/21/2013 09:39     Labs Review Labs Reviewed  COMPREHENSIVE METABOLIC PANEL - Abnormal; Notable for the following:    Sodium 149 (*)    Chloride 113 (*)    Glucose, Bld 181 (*)    BUN 57 (*)    Creatinine, Ser 2.33 (*)    Albumin 3.1 (*)    Alkaline Phosphatase 120 (*)    GFR calc non Af Amer 18 (*)    GFR calc Af Amer 21 (*)    All other components within normal limits  LIPASE, BLOOD - Abnormal; Notable for the following:    Lipase 463  (*)    All other components within normal limits  URINALYSIS, ROUTINE W REFLEX MICROSCOPIC - Abnormal; Notable for the following:    APPearance CLOUDY (*)    Hgb urine dipstick SMALL (*)    Protein, ur 30 (*)    Leukocytes, UA MODERATE (*)    All other components within normal limits  CBC WITH DIFFERENTIAL - Abnormal; Notable for the following:    RBC 3.34 (*)    Hemoglobin 9.5 (*)    HCT 28.9 (*)    Lymphocytes Relative 11 (*)    Monocytes Relative 15 (*)    Monocytes Absolute 1.4 (*)    All other components  within normal limits  PRO B NATRIURETIC PEPTIDE - Abnormal; Notable for the following:    Pro B Natriuretic peptide (BNP) 686.3 (*)    All other components within normal limits  URINE MICROSCOPIC-ADD ON - Abnormal; Notable for the following:    Squamous Epithelial / LPF FEW (*)    Bacteria, UA MANY (*)    All other components within normal limits  LACTIC ACID, PLASMA  TROPONIN I    Imaging Review Dg Chest 1 View  10/24/2013   CLINICAL DATA:  Fever, cough and congestion  EXAM: CHEST - 1 VIEW  COMPARISON:  09/17/2013  FINDINGS: Cardiac silhouette is normal in size. The aorta is uncoiled. No mediastinal or hilar masses. No evidence of adenopathy.  Lungs are clear.  No pleural effusion.  No pneumothorax.  Bony thorax is demineralized but intact.  IMPRESSION: No acute cardiopulmonary disease.   Electronically Signed   By: Lajean Manes M.D.   On: 10/24/2013 20:25   Ct Head Wo Contrast  10/24/2013   CLINICAL DATA:  Dementia and altered mental status.  Cough.  EXAM: CT HEAD WITHOUT CONTRAST  TECHNIQUE: Contiguous axial images were obtained from the base of the skull through the vertex without intravenous contrast.  COMPARISON:  05/01/2013  FINDINGS: Skull and Sinuses:Chronic anterior nasal septal defect. Debris or hemorrhage noted at the defect currently. There is inflammatory mucosal thickening scattered about the bilateral paranasal sinuses. No sinus effusion or osseous erosion.   Orbits: Left cataract resection.  Brain: No evidence of acute abnormality, such as acute infarction, hemorrhage, hydrocephalus, or mass lesion/mass effect. Stable pattern of chronic small vessel disease with ischemic gliosis confluent around the lateral ventricles. There is generalized cerebral volume loss with ex vacuo ventricular enlargement, pattern and severity stable from previous.  IMPRESSION: 1. No acute intracranial findings. 2. Chronic small vessel disease and brain atrophy. 3. Bilateral sinusitis.  Chronic anterior nasal septal perforation.   Electronically Signed   By: Jorje Guild M.D.   On: 10/24/2013 23:40     EKG Interpretation None      Date: 10/24/2013  Rate: 91  Rhythm: normal sinus rhythm  QRS Axis: normal  Intervals: PR prolonged  ST/T Wave abnormalities: nonspecific ST/T changes  Conduction Disutrbances:right bundle branch block  Narrative Interpretation:   Old EKG Reviewed: none available      MDM   Final diagnoses:  Dehydration  UTI (urinary tract infection)  Dementia with behavioral disturbance  Altered mental status  Cough    Patient clinically very dehydrated. The daughter did state that she has not been eating or drinking well. Patient does have evidence for urinary tract infection will treat with antibiotics IV here. Urine culture pending. Lactic acid is not elevated. Head CT negative chest x-ray negative for pneumonia however there has been a fairly significant cough. Patient with an isolated significant elevation of lipase abdomen was nontender to palpation. Liver function tests without  abnormalities other than some mild elevation in alkaline phosphatase but no elevation of bilirubin. Do not think patient has an acute gallbladder process ongoing. However when she improves ultrasound of the area could be done. Right now due to her behavior would not be able to get a CT scan completed. The patient's followed by Dr. Legrand Rams. We'll discuss with hospitalist  for admission. We'll treat urinary tract infection with antibiotics we'll hydrate her for the dehydration. Further evaluation an elevated lipase will need to be done.     I personally performed the services described in this  documentation, which was scribed in my presence. The recorded information has been reviewed and is accurate.       [name] Sorrow, MD 10/24/13 [phone]

## 2013-10-24 NOTE — ED Notes (Signed)
Pt from [facility], here by ems for daughter request of a repeat chest x-ray.  Pt apparently had a portable chest done at facility on Friday but daughter feels the portable chest was "not accurate" and that she would like it to be repeated.

## 2013-10-25 ENCOUNTER — Inpatient Hospital Stay (HOSPITAL_COMMUNITY): Payer: PRIVATE HEALTH INSURANCE

## 2013-10-25 ENCOUNTER — Encounter (HOSPITAL_COMMUNITY): Payer: Self-pay | Admitting: Gastroenterology

## 2013-10-25 DIAGNOSIS — I059 Rheumatic mitral valve disease, unspecified: Secondary | ICD-10-CM

## 2013-10-25 DIAGNOSIS — R4182 Altered mental status, unspecified: Secondary | ICD-10-CM

## 2013-10-25 DIAGNOSIS — R109 Unspecified abdominal pain: Secondary | ICD-10-CM

## 2013-10-25 DIAGNOSIS — N390 Urinary tract infection, site not specified: Principal | ICD-10-CM | POA: Diagnosis present

## 2013-10-25 DIAGNOSIS — E860 Dehydration: Secondary | ICD-10-CM

## 2013-10-25 DIAGNOSIS — N179 Acute kidney failure, unspecified: Secondary | ICD-10-CM

## 2013-10-25 LAB — CBC
HCT: 27.9 % — ABNORMAL LOW (ref 36.0–46.0)
HEMOGLOBIN: 9 g/dL — AB (ref 12.0–15.0)
MCH: 28.2 pg (ref 26.0–34.0)
MCHC: 32.3 g/dL (ref 30.0–36.0)
MCV: 87.5 fL (ref 78.0–100.0)
Platelets: 268 10*3/uL (ref 150–400)
RBC: 3.19 MIL/uL — ABNORMAL LOW (ref 3.87–5.11)
RDW: 15.4 % (ref 11.5–15.5)
WBC: 8.7 10*3/uL (ref 4.0–10.5)

## 2013-10-25 LAB — COMPREHENSIVE METABOLIC PANEL
ALBUMIN: 2.9 g/dL — AB (ref 3.5–5.2)
ALT: 5 U/L (ref 0–35)
AST: 11 U/L (ref 0–37)
Alkaline Phosphatase: 129 U/L — ABNORMAL HIGH (ref 39–117)
BUN: 60 mg/dL — ABNORMAL HIGH (ref 6–23)
CALCIUM: 9.3 mg/dL (ref 8.4–10.5)
CO2: 21 mEq/L (ref 19–32)
Chloride: 117 mEq/L — ABNORMAL HIGH (ref 96–112)
Creatinine, Ser: 2.31 mg/dL — ABNORMAL HIGH (ref 0.50–1.10)
GFR calc non Af Amer: 19 mL/min — ABNORMAL LOW (ref >90)
GFR, EST AFRICAN AMERICAN: 21 mL/min — AB (ref >90)
GLUCOSE: 122 mg/dL — AB (ref 70–99)
Potassium: 4.1 mEq/L (ref 3.7–5.3)
SODIUM: 153 meq/L — AB (ref 137–147)
TOTAL PROTEIN: 7.5 g/dL (ref 6.0–8.3)
Total Bilirubin: 0.4 mg/dL (ref 0.3–1.2)

## 2013-10-25 LAB — LIPASE, BLOOD: Lipase: 497 U/L — ABNORMAL HIGH (ref 11–59)

## 2013-10-25 MED ORDER — ESCITALOPRAM OXALATE 10 MG PO TABS
15.0000 mg | ORAL_TABLET | Freq: Every day | ORAL | Status: DC
  Administered 2013-10-25 – 2013-11-01 (×7): 15 mg via ORAL
  Filled 2013-10-25 (×10): qty 2

## 2013-10-25 MED ORDER — VITAMIN B-12 1000 MCG PO TABS
1000.0000 ug | ORAL_TABLET | Freq: Every morning | ORAL | Status: DC
  Administered 2013-10-25 – 2013-11-01 (×7): 1000 ug via ORAL
  Filled 2013-10-25 (×9): qty 1

## 2013-10-25 MED ORDER — ONDANSETRON HCL 4 MG PO TABS: 4.0000 mg | ORAL_TABLET | Freq: Three times a day (TID) | ORAL | Status: DC | PRN

## 2013-10-25 MED ORDER — QUETIAPINE FUMARATE 100 MG PO TABS
200.0000 mg | ORAL_TABLET | Freq: Every day | ORAL | Status: DC
  Administered 2013-10-25 – 2013-10-30 (×6): 200 mg via ORAL
  Filled 2013-10-25 (×7): qty 2

## 2013-10-25 MED ORDER — SODIUM CHLORIDE 0.45 % IV SOLN: INTRAVENOUS | Status: DC

## 2013-10-25 MED ORDER — AMLODIPINE BESYLATE 5 MG PO TABS
5.0000 mg | ORAL_TABLET | Freq: Every morning | ORAL | Status: DC
  Administered 2013-10-27 – 2013-11-01 (×6): 5 mg via ORAL
  Filled 2013-10-25 (×8): qty 1

## 2013-10-25 MED ORDER — SODIUM CHLORIDE 0.9 % IV SOLN: INTRAVENOUS | Status: DC

## 2013-10-25 MED ORDER — LORAZEPAM 2 MG/ML IJ SOLN
1.0000 mg | Freq: Once | INTRAMUSCULAR | Status: AC
  Administered 2013-10-25: 1 mg via INTRAVENOUS
  Filled 2013-10-25: qty 1

## 2013-10-25 MED ORDER — DEXTROSE 5 % IV SOLN
1.0000 g | INTRAVENOUS | Status: DC
  Administered 2013-10-25 – 2013-10-28 (×4): 1 g via INTRAVENOUS
  Filled 2013-10-25 (×4): qty 10

## 2013-10-25 MED ORDER — DEXTROSE 5 % IV SOLN
INTRAVENOUS | Status: DC
  Administered 2013-10-25 (×3): via INTRAVENOUS

## 2013-10-25 MED ORDER — ALPRAZOLAM 0.5 MG PO TABS
0.5000 mg | ORAL_TABLET | Freq: Four times a day (QID) | ORAL | Status: DC | PRN
  Administered 2013-10-25 – 2013-10-31 (×8): 0.5 mg via ORAL
  Filled 2013-10-25 (×9): qty 1

## 2013-10-25 MED ORDER — GUAIFENESIN-DM 100-10 MG/5ML PO SYRP
10.0000 mL | ORAL_SOLUTION | Freq: Three times a day (TID) | ORAL | Status: DC | PRN
  Administered 2013-10-25 – 2013-10-29 (×7): 10 mL via ORAL
  Filled 2013-10-25 (×9): qty 10

## 2013-10-25 MED ORDER — PANTOPRAZOLE SODIUM 40 MG PO TBEC
40.0000 mg | DELAYED_RELEASE_TABLET | Freq: Every day | ORAL | Status: DC
  Administered 2013-10-25 – 2013-10-26 (×2): 40 mg via ORAL
  Filled 2013-10-25 (×2): qty 1

## 2013-10-25 MED ORDER — IOHEXOL 300 MG/ML  SOLN
50.0000 mL | Freq: Once | INTRAMUSCULAR | Status: AC | PRN
  Administered 2013-10-25: 50 mL via ORAL

## 2013-10-25 MED ORDER — ACETAMINOPHEN 650 MG RE SUPP
650.0000 mg | Freq: Four times a day (QID) | RECTAL | Status: DC | PRN
  Administered 2013-10-30: 650 mg via RECTAL
  Filled 2013-10-25: qty 1

## 2013-10-25 MED ORDER — ACETAMINOPHEN 325 MG PO TABS
650.0000 mg | ORAL_TABLET | Freq: Four times a day (QID) | ORAL | Status: DC | PRN
  Administered 2013-10-29 (×2): 650 mg via ORAL
  Filled 2013-10-25 (×2): qty 2

## 2013-10-25 MED ORDER — ATORVASTATIN CALCIUM 10 MG PO TABS
10.0000 mg | ORAL_TABLET | Freq: Every day | ORAL | Status: DC
  Administered 2013-10-25 – 2013-10-30 (×6): 10 mg via ORAL
  Filled 2013-10-25 (×7): qty 1

## 2013-10-25 MED ORDER — ACETAMINOPHEN 650 MG RE SUPP
650.0000 mg | Freq: Once | RECTAL | Status: AC
  Administered 2013-10-25: 650 mg via RECTAL
  Filled 2013-10-25: qty 1

## 2013-10-25 MED ORDER — FERROUS SULFATE 325 (65 FE) MG PO TABS
325.0000 mg | ORAL_TABLET | Freq: Every morning | ORAL | Status: DC
  Administered 2013-10-25 – 2013-11-01 (×7): 325 mg via ORAL
  Filled 2013-10-25 (×9): qty 1

## 2013-10-25 MED ORDER — BIOTENE DRY MOUTH MT LIQD
15.0000 mL | Freq: Two times a day (BID) | OROMUCOSAL | Status: DC
  Administered 2013-10-25 – 2013-11-01 (×11): 15 mL via OROMUCOSAL

## 2013-10-25 MED ORDER — BOOST / RESOURCE BREEZE PO LIQD
1.0000 | Freq: Three times a day (TID) | ORAL | Status: DC
  Administered 2013-10-26 – 2013-11-01 (×7): 1 via ORAL

## 2013-10-25 MED ORDER — IPRATROPIUM-ALBUTEROL 0.5-2.5 (3) MG/3ML IN SOLN
3.0000 mL | Freq: Four times a day (QID) | RESPIRATORY_TRACT | Status: DC | PRN
  Administered 2013-10-25: 3 mL via RESPIRATORY_TRACT
  Filled 2013-10-25: qty 3

## 2013-10-25 MED ORDER — ENOXAPARIN SODIUM 30 MG/0.3ML ~~LOC~~ SOLN
30.0000 mg | SUBCUTANEOUS | Status: DC
  Administered 2013-10-26 – 2013-11-01 (×7): 30 mg via SUBCUTANEOUS
  Filled 2013-10-25 (×9): qty 0.3

## 2013-10-25 MED ORDER — HALOPERIDOL LACTATE 5 MG/ML IJ SOLN
1.5000 mg | Freq: Three times a day (TID) | INTRAMUSCULAR | Status: DC | PRN
  Administered 2013-10-26 – 2013-11-01 (×4): 1.5 mg via INTRAMUSCULAR
  Filled 2013-10-25 (×5): qty 1

## 2013-10-25 NOTE — Care Management Note (Addendum)
    Page 1 of 1   11/01/2013     12:31:17 PM CARE MANAGEMENT NOTE 11/01/2013  Patient:  [name] [name], [name] [name]   Account Number:  [id number]  Date Initiated:  10/25/2013  Documentation initiated by:  Claretha Cooper  Subjective/Objective Assessment:   Pt admitted from [facility]. Getting a GI workup and plans to return to [facility] with CSW's assistance     Action/Plan:   Anticipated DC Date:     Anticipated DC Plan:  SKILLED NURSING FACILITY  In-house referral  Clinical Social Worker      DC Planning Services  CM consult      Choice offered to / List presented to:             Status of service:  Completed, signed off Medicare Important Message given?  YES (If response is "NO", the following Medicare IM given date fields will be blank) Date Medicare IM given:  10/29/2013 Date Additional Medicare IM given:  11/01/2013  Discharge Disposition:  [facility]  Per UR Regulation:    If discussed at Long Length of Stay Meetings, dates discussed:    Comments:  10/25/13 Claretha Cooper RN BSN CM

## 2013-10-25 NOTE — Progress Notes (Signed)
CT results are back. Dr. Legrand Rams paged.

## 2013-10-25 NOTE — Consult Note (Signed)
Referring Provider: Dr. Legrand Rams Primary Care Physician:  Rosita Fire, MD Primary Gastroenterologist:  Dr. Oneida Alar  Date of Admission: 10/25/13 Date of Consultation: 10/25/13  Reason for Consultation:  Elevated lipase   HPI:  80 year old female well known to our practice from a prior admission secondary to abdominal pain. Underwent EGD at that time which revealed possible proximal esophageal web s/p dilation, gastritis, gastric polyps. US abdomen with cholelithiasis but without acute findings. Likely dementia along with gastritis as culprit for anorexia and weight loss. Readmitted with UTI, dehydration, lipase 463. CT without contrast ordered, revealing cholelithiasis without definite evidence of cholecystitis, borderline cardiomegaly with suspect calcifications within aortic valve leaflets, atrophic pancreas without definitive peripancreatic stranding.   Constance Holster, daughter present. Patient extremely confused, agitated, uncooperative. Unable to obtain personal history from patient. Daughter provided history; no N/V. Drank a lot of fluids today. Does not do well feeding herself. Will eat if she is given food. Complains of diffuse pain all over her body. Does not routinely complain of abdominal pain, but when she is asked, she will say "yes".   Past Medical History  Diagnosis Date  . Chronic kidney disease     acute renal failure  . COPD (chronic obstructive pulmonary disease)   . Diabetes mellitus   . Depression   . DEMENTIA   . Hypertension   . Shortness of breath   . GERD (gastroesophageal reflux disease)   . Gallstones   . Diverticulosis   . Concern about mental disorder without diagnosis   . Dehydration   . Anxiety   . Dysphagia   . Anemia   . Cholelithiases   . Renal stones   . Hyperlipidemia   . Hypokalemia   . Dementia     Past Surgical History  Procedure Laterality Date  . Abdominal hysterectomy    . Cystoscopy  2008    with stent placement  . Extracorporeal shock wave  lithotripsy  03/13/2011    Procedure: EXTRACORPOREAL SHOCK WAVE LITHOTRIPSY (ESWL);  Surgeon: Marissa Nestle;  Location: [facility];  Service: Urology;  Laterality: Right;  Right Renal Calculus  . Cystoscopy with litholapaxy  03/28/2011    Procedure: CYSTOSCOPY WITH LITHOLAPAXY;  Surgeon: Marissa Nestle;  Location: [facility];  Service: Urology;  Laterality: Right;  . Cystoscopy w/ ureteral stent placement  03/28/2011    Procedure: CYSTOSCOPY WITH RETROGRADE PYELOGRAM/URETERAL STENT PLACEMENT;  Surgeon: Marissa Nestle;  Location: [facility];  Service: Urology;  Laterality: Right;  . Extracorporeal shock wave lithotripsy  09/25/2011    Procedure: EXTRACORPOREAL SHOCK WAVE LITHOTRIPSY (ESWL);  Surgeon: Marissa Nestle, MD;  Location: [facility];  Service: Urology;  Laterality: Right;  ESWL Right Renal Calculus  . Esophagogastroduodenoscopy  03/19/2005    GDJ:MEQASTM antral gastritis, need to rule out H pylori infection/Pyloric channel ulcer without stigmata of bleed/Bulbar ulcer with duodenitis without stigmata of bleed  . Esophagogastroduodenoscopy N/A 09/19/2013    Dr. Oneida Alar: distal gastritis, probable proximal esophageal web s/p Savary dilation , gastric polyp, chronic gastritis  . Esophageal dilation  09/19/2013    Procedure: ESOPHAGEAL DILATION;  Surgeon: Danie Binder, MD;  Location: [facility];  Service: Endoscopy;;    Prior to Admission medications   Medication Sig Start Date End Date Taking? Authorizing Provider  acetaminophen (TYLENOL) 500 MG tablet Take 500 mg by mouth 2 (two) times daily.   Yes Historical Provider, MD  ALPRAZolam Duanne Moron) 0.5 MG tablet Take 0.5 mg by mouth every 6 (six) hours as needed for  anxiety.    Yes Historical Provider, MD  amLODipine (NORVASC) 5 MG tablet Take 5 mg by mouth every morning.    Yes Historical Provider, MD  atorvastatin (LIPITOR) 10 MG tablet Take 10 mg by mouth at bedtime.    Yes Historical Provider, MD  Calcium Carbonate-Vitamin D (CALCARB 600/D)  600-400 MG-UNIT per tablet Take 1 tablet by mouth 2 (two) times daily.   Yes Historical Provider, MD  escitalopram (LEXAPRO) 5 MG tablet Take 15 mg by mouth daily.   Yes Historical Provider, MD  feeding supplement, RESOURCE BREEZE, (RESOURCE BREEZE) LIQD Take 1 Container by mouth 3 (three) times daily between meals. 09/21/13  Yes Rosita Fire, MD  ferrous sulfate 325 (65 FE) MG tablet Take 325 mg by mouth every morning.   Yes Historical Provider, MD  HYDROcodone-acetaminophen (NORCO/VICODIN) 5-325 MG per tablet Take 1 tablet by mouth every 6 (six) hours as needed for moderate pain. pain   Yes Historical Provider, MD  ondansetron (ZOFRAN) 4 MG tablet Take 4 mg by mouth every 8 (eight) hours as needed for nausea or vomiting.   Yes Historical Provider, MD  pantoprazole (PROTONIX) 40 MG tablet Take 1 tablet (40 mg total) by mouth daily before breakfast. 04/16/13  Yes Mahala Menghini, PA-C  QUEtiapine (SEROQUEL) 200 MG tablet Take 200 mg by mouth.   Yes Historical Provider, MD  sennosides-docusate sodium (SENOKOT-S) 8.6-50 MG tablet Take 2 tablets by mouth 2 (two) times daily.   Yes Historical Provider, MD  vitamin B-12 (CYANOCOBALAMIN) 1000 MCG tablet Take 1,000 mcg by mouth every morning.    Yes Historical Provider, MD  fluticasone (FLONASE) 50 MCG/ACT nasal spray Place 2 sprays into the nose daily. allergies     Historical Provider, MD  haloperidol lactate (HALDOL) 5 MG/ML injection Inject 1.5 mg into the muscle every 8 (eight) hours as needed (for agitation).    Historical Provider, MD    Current Facility-Administered Medications  Medication Dose Route Frequency Provider Last Rate Last Dose  . acetaminophen (TYLENOL) tablet 650 mg  650 mg Oral Q6H PRN Oswald Hillock, MD       Or  . acetaminophen (TYLENOL) suppository 650 mg  650 mg Rectal Q6H PRN Oswald Hillock, MD      . ALPRAZolam Duanne Moron) tablet 0.5 mg  0.5 mg Oral Q6H PRN Oswald Hillock, MD      . amLODipine (NORVASC) tablet 5 mg  5 mg Oral q morning  - 10a Oswald Hillock, MD      . antiseptic oral rinse (BIOTENE) solution 15 mL  15 mL Mouth Rinse BID Rosita Fire, MD      . atorvastatin (LIPITOR) tablet 10 mg  10 mg Oral QHS Oswald Hillock, MD      . cefTRIAXone (ROCEPHIN) 1 g in dextrose 5 % 50 mL IVPB  1 g Intravenous Q24H Oswald Hillock, MD      . dextrose 5 % solution   Intravenous Continuous Rosita Fire, MD 100 mL/hr at 10/25/13 0913    . enoxaparin (LOVENOX) injection 30 mg  30 mg Subcutaneous Q24H Oswald Hillock, MD      . escitalopram (LEXAPRO) tablet 15 mg  15 mg Oral Daily Oswald Hillock, MD      . feeding supplement (RESOURCE BREEZE) (RESOURCE BREEZE) liquid 1 Container  1 Container Oral TID BM Oswald Hillock, MD      . ferrous sulfate tablet 325 mg  325 mg Oral q morning - 10a  Oswald Hillock, MD      . guaiFENesin-dextromethorphan (ROBITUSSIN DM) 100-10 MG/5ML syrup 10 mL  10 mL Oral TID PRN Rosita Fire, MD   10 mL at 10/25/13 1135  . haloperidol lactate (HALDOL) injection 1.5 mg  1.5 mg Intramuscular Q8H PRN Oswald Hillock, MD      . ipratropium-albuterol (DUONEB) 0.5-2.5 (3) MG/3ML nebulizer solution 3 mL  3 mL Nebulization QID PRN Rosita Fire, MD      . ondansetron (ZOFRAN) tablet 4 mg  4 mg Oral Q8H PRN Oswald Hillock, MD      . pantoprazole (PROTONIX) EC tablet 40 mg  40 mg Oral QAC breakfast Oswald Hillock, MD      . QUEtiapine (SEROQUEL) tablet 200 mg  200 mg Oral QHS Oswald Hillock, MD      . vitamin B-12 (CYANOCOBALAMIN) tablet 1,000 mcg  1,000 mcg Oral q morning - 10a Oswald Hillock, MD        Allergies as of 10/24/2013  . (No Known Allergies)    Family History  Problem Relation Age of Onset  . Anesthesia problems Neg Hx   . Hypotension Neg Hx   . Malignant hyperthermia Neg Hx   . Pseudochol deficiency Neg Hx     History   Social History  . Marital Status: Widowed    Spouse Name: N/A    Number of Children: N/A  . Years of Education: N/A   Occupational History  . Not on file.   Social History Main Topics  . Smoking  status: Former Smoker -- 0.50 packs/day for 30 years    Types: Cigarettes    Quit date: 03/25/2003  . Smokeless tobacco: Not on file  . Alcohol Use: No  . Drug Use: No  . Sexual Activity: No   Other Topics Concern  . Not on file   Social History Narrative  . No narrative on file    Review of Systems: Unable to obtain.   Physical Exam: Vital signs in last 24 hours: Temp:  [98.3 F (36.8 C)-101 F (38.3 C)] 98.7 F (37.1 C) (06/01 0555) Pulse Rate:  [78-95] 85 (06/01 0135) Resp:  [16-27] 16 (06/01 0555) BP: (112-148)/(48-94) 141/94 mmHg (06/01 0555) SpO2:  [90 %-97 %] 95 % (06/01 0555) Weight:  [147 lb 7.8 oz (66.9 kg)] 147 lb 7.8 oz (66.9 kg) (06/01 0135)   General:   Combative, disoriented, agitated.  Head:  Normocephalic and atraumatic. Eyes:  Sclera clear, no icterus.   Ears:  Normal auditory acuity. Nose:  No deformity, discharge,  or lesions. Mouth:  edentulous Lungs:  Clear throughout to auscultation.   Heart:  S1 S2 present, no appreciable murmur Abdomen:  Soft, nontender and nondistended. query umbilical hernia. No HSM Rectal:  Deferred  Extremities:  Without edema. Neurologic:  Alert to person only. Agitated.  Skin:  Intact without significant lesions or rashes. Psych:  Alert and agitated.   Intake/Output from previous day: 05/31 0701 - 06/01 0700 In: 320 [I.V.:320] Out: -  Intake/Output this shift:    Lab Results:  Recent Labs  10/24/13 2220 10/25/13 0605  WBC 9.4 8.7  HGB 9.5* 9.0*  HCT 28.9* 27.9*  PLT 265 268   BMET  Recent Labs  10/24/13 2220 10/25/13 0605  NA 149* 153*  K 4.1 4.1  CL 113* 117*  CO2 19 21  GLUCOSE 181* 122*  BUN 57* 60*  CREATININE 2.33* 2.31*  CALCIUM 9.6 9.3   LFT  Recent  Labs  10/24/13 2220 10/25/13 0605  PROT 8.0 7.5  ALBUMIN 3.1* 2.9*  AST 11 11  ALT 5 5  ALKPHOS 120* 129*  BILITOT 0.5 0.4   Lab Results  Component Value Date   LIPASE 497* 10/25/2013     Studies/Results: Dg Chest 1  View  10/24/2013   CLINICAL DATA:  Fever, cough and congestion  EXAM: CHEST - 1 VIEW  COMPARISON:  09/17/2013  FINDINGS: Cardiac silhouette is normal in size. The aorta is uncoiled. No mediastinal or hilar masses. No evidence of adenopathy.  Lungs are clear.  No pleural effusion.  No pneumothorax.  Bony thorax is demineralized but intact.  IMPRESSION: No acute cardiopulmonary disease.   Electronically Signed   By: Lajean Manes M.D.   On: 10/24/2013 20:25   Ct Head Wo Contrast  10/24/2013   CLINICAL DATA:  Dementia and altered mental status.  Cough.  EXAM: CT HEAD WITHOUT CONTRAST  TECHNIQUE: Contiguous axial images were obtained from the base of the skull through the vertex without intravenous contrast.  COMPARISON:  05/01/2013  FINDINGS: Skull and Sinuses:Chronic anterior nasal septal defect. Debris or hemorrhage noted at the defect currently. There is inflammatory mucosal thickening scattered about the bilateral paranasal sinuses. No sinus effusion or osseous erosion.  Orbits: Left cataract resection.  Brain: No evidence of acute abnormality, such as acute infarction, hemorrhage, hydrocephalus, or mass lesion/mass effect. Stable pattern of chronic small vessel disease with ischemic gliosis confluent around the lateral ventricles. There is generalized cerebral volume loss with ex vacuo ventricular enlargement, pattern and severity stable from previous.  IMPRESSION: 1. No acute intracranial findings. 2. Chronic small vessel disease and brain atrophy. 3. Bilateral sinusitis.  Chronic anterior nasal septal perforation.   Electronically Signed   By: Jorje Guild M.D.   On: 10/24/2013 23:40    Impression: 80 year old female with history of dementia, presenting with UTI, dehydration, and elevated lipase in the setting of acute renal failure. CT today without obvious changes of pancreatitis. Pancreatic atrophy noted. Unable to obtain adequate history from patient due to dementia, but daughter, Constance Holster, is at  bedside and offers all pertinent information. In the setting of acute renal failure, elevated lipase non-specific. Could better evaluate pancreas with MRI/MRCP; however, it is unlikely she would remain still for this procedure due to significant agitation and combativeness. Would recommend supportive care at this point, recheck lipase in the morning. Renal function without significant improvement since admission. CXR on file from 5/31 without acute issues. Patient would benefit from increased IV hydration. Currently on D5W at 100 (previously changed from normal saline due to hypernatremia).   Plan: Recheck lipase in am Increase IVFs to 125 Supportive measures Protonix each morning  Orvil Feil, ANP-BC [facility]     LOS: 1 day    10/25/2013, 11:56 AM

## 2013-10-25 NOTE — Progress Notes (Signed)
Subjective: Patient was admitted from [facility] due to abdominal pain. Patient was found to have UTI, hypernatremia and acute renal injury. She is more awake and alert. Complains of abdominal pain. No nausea and vomiting. Her lipase is elevated. Patient has history of cholelithiasis   Objective: Vital signs in last 24 hours: Temp:  [98.3 F (36.8 C)-101 F (38.3 C)] 98.7 F (37.1 C) (06/01 0555) Pulse Rate:  [78-95] 85 (06/01 0135) Resp:  [16-27] 16 (06/01 0555) BP: (112-148)/(48-94) 141/94 mmHg (06/01 0555) SpO2:  [90 %-97 %] 95 % (06/01 0555) Weight:  [66.9 kg (147 lb 7.8 oz)] 66.9 kg (147 lb 7.8 oz) (06/01 0135) Weight change:     Intake/Output from previous day: 05/31 0701 - 06/01 0700 In: 320 [I.V.:320] Out: -   PHYSICAL EXAM General appearance: alert and slowed mentation Resp: clear to auscultation bilaterally Cardio: S1, S2 normal GI: abdomen is soft and lax, tenderness of the lower quadrant. Bowel sound is positive Extremities: extremities normal, atraumatic, no cyanosis or edema  Lab Results:    @labtest @ [facility] No results found for this basename: PHART, PCO2, PO2ART, TCO2, HCO3,  in the last 72 hours CULTURES No results found for this or any previous visit (from the past 240 hour(s)). Studies/Results: Dg Chest 1 View  10/24/2013   CLINICAL DATA:  Fever, cough and congestion  EXAM: CHEST - 1 VIEW  COMPARISON:  09/17/2013  FINDINGS: Cardiac silhouette is normal in size. The aorta is uncoiled. No mediastinal or hilar masses. No evidence of adenopathy.  Lungs are clear.  No pleural effusion.  No pneumothorax.  Bony thorax is demineralized but intact.  IMPRESSION: No acute cardiopulmonary disease.   Electronically Signed   By: Lajean Manes M.D.   On: 10/24/2013 20:25   Ct Head Wo Contrast  10/24/2013   CLINICAL DATA:  Dementia and altered mental status.  Cough.  EXAM: CT HEAD WITHOUT CONTRAST  TECHNIQUE: Contiguous axial images were obtained from the base of  the skull through the vertex without intravenous contrast.  COMPARISON:  05/01/2013  FINDINGS: Skull and Sinuses:Chronic anterior nasal septal defect. Debris or hemorrhage noted at the defect currently. There is inflammatory mucosal thickening scattered about the bilateral paranasal sinuses. No sinus effusion or osseous erosion.  Orbits: Left cataract resection.  Brain: No evidence of acute abnormality, such as acute infarction, hemorrhage, hydrocephalus, or mass lesion/mass effect. Stable pattern of chronic small vessel disease with ischemic gliosis confluent around the lateral ventricles. There is generalized cerebral volume loss with ex vacuo ventricular enlargement, pattern and severity stable from previous.  IMPRESSION: 1. No acute intracranial findings. 2. Chronic small vessel disease and brain atrophy. 3. Bilateral sinusitis.  Chronic anterior nasal septal perforation.   Electronically Signed   By: Jorje Guild M.D.   On: 10/24/2013 23:40    Medications: I have reviewed the patient's current medications.  Assesment: Active Problems:   Dementia with behavioral disturbance   UTI (lower urinary tract infection)   AKI (acute kidney injury) hypernatremia elevated lipase Abdominal pain H/O cholelithiasis R/O pancreatitis    Plan: Medications reviewed Will change IV fluid to D5W at 100 cc/hr Will do CT of the abdomen Will do GI consult Continue IV antibiotics    LOS: 1 day   [name] [name] 10/25/2013, 8:10 AM

## 2013-10-25 NOTE — Care Management Utilization Note (Signed)
UR completed 

## 2013-10-25 NOTE — H&P (Addendum)
PCP:   FANTA,TESFAYE, MD   Chief Complaint:  Altered mental status  HPI:  80 year old female who  has a past medical history of Chronic kidney disease; COPD (chronic obstructive pulmonary disease); Diabetes mellitus; Depression; DEMENTIA; Hypertension; Shortness of breath; GERD (gastroesophageal reflux disease); Gallstones; Diverticulosis; Concern about mental disorder without diagnosis; Dehydration; Anxiety; Dysphagia; Anemia; Cholelithiases; Renal stones; Hyperlipidemia; Hypokalemia; and Dementia. Patient resides at skilled nursing facility, and was brought to the hospital for altered mental status and cough he had patient's daughter who provides the history, patient has dementia and is limited in communication he had posture this patient has not been eating and drinking well, and today the nursing home staff called the daughter and told her that she was confused. She also has been coughing, and fever, but no nausea vomiting or diarrhea. In the ED patient found to have UTI with abnormal urine, dehydration with elevated sodium and elevated BUN creatinine. She also has elevated lipase 463. Patient denies pain at this time.  Allergies:  No Known Allergies    Past Medical History  Diagnosis Date  . Chronic kidney disease     acute renal failure  . COPD (chronic obstructive pulmonary disease)   . Diabetes mellitus   . Depression   . DEMENTIA   . Hypertension   . Shortness of breath   . GERD (gastroesophageal reflux disease)   . Gallstones   . Diverticulosis   . Concern about mental disorder without diagnosis   . Dehydration   . Anxiety   . Dysphagia   . Anemia   . Cholelithiases   . Renal stones   . Hyperlipidemia   . Hypokalemia   . Dementia     Past Surgical History  Procedure Laterality Date  . Abdominal hysterectomy    . Cystoscopy  2008    with stent placement  . Extracorporeal shock wave lithotripsy  03/13/2011    Procedure: EXTRACORPOREAL SHOCK WAVE LITHOTRIPSY  (ESWL);  Surgeon: Marissa Nestle;  Location: [facility];  Service: Urology;  Laterality: Right;  Right Renal Calculus  . Cystoscopy with litholapaxy  03/28/2011    Procedure: CYSTOSCOPY WITH LITHOLAPAXY;  Surgeon: Marissa Nestle;  Location: [facility];  Service: Urology;  Laterality: Right;  . Cystoscopy w/ ureteral stent placement  03/28/2011    Procedure: CYSTOSCOPY WITH RETROGRADE PYELOGRAM/URETERAL STENT PLACEMENT;  Surgeon: Marissa Nestle;  Location: [facility];  Service: Urology;  Laterality: Right;  . Extracorporeal shock wave lithotripsy  09/25/2011    Procedure: EXTRACORPOREAL SHOCK WAVE LITHOTRIPSY (ESWL);  Surgeon: Marissa Nestle, MD;  Location: [facility];  Service: Urology;  Laterality: Right;  ESWL Right Renal Calculus  . Esophagogastroduodenoscopy  03/19/2005    YSA:[id number] antral gastritis, need to rule out H pylori infection/Pyloric channel ulcer without stigmata of bleed/Bulbar ulcer with duodenitis without stigmata of bleed  . Esophagogastroduodenoscopy N/A 09/19/2013    Procedure: ESOPHAGOGASTRODUODENOSCOPY (EGD);  Surgeon: Danie Binder, MD;  Location: [facility];  Service: Endoscopy;  Laterality: N/A;  . Esophageal dilation  09/19/2013    Procedure: ESOPHAGEAL DILATION;  Surgeon: Danie Binder, MD;  Location: [facility];  Service: Endoscopy;;    Prior to Admission medications   Medication Sig Start Date End Date Taking? Authorizing Provider  acetaminophen (TYLENOL) 500 MG tablet Take 500 mg by mouth 2 (two) times daily.   Yes Historical Provider, MD  ALPRAZolam Duanne Moron) 0.5 MG tablet Take 0.5 mg by mouth every 6 (six) hours as needed for anxiety.  Yes Historical Provider, MD  amLODipine (NORVASC) 5 MG tablet Take 5 mg by mouth every morning.    Yes Historical Provider, MD  atorvastatin (LIPITOR) 10 MG tablet Take 10 mg by mouth at bedtime.    Yes Historical Provider, MD  Calcium Carbonate-Vitamin D (CALCARB 600/D) 600-400 MG-UNIT per tablet Take 1 tablet by mouth 2 (two)  times daily.   Yes Historical Provider, MD  escitalopram (LEXAPRO) 5 MG tablet Take 15 mg by mouth daily.   Yes Historical Provider, MD  feeding supplement, RESOURCE BREEZE, (RESOURCE BREEZE) LIQD Take 1 Container by mouth 3 (three) times daily between meals. 09/21/13  Yes Rosita Fire, MD  ferrous sulfate 325 (65 FE) MG tablet Take 325 mg by mouth every morning.   Yes Historical Provider, MD  HYDROcodone-acetaminophen (NORCO/VICODIN) 5-325 MG per tablet Take 1 tablet by mouth every 6 (six) hours as needed for moderate pain. pain   Yes Historical Provider, MD  ondansetron (ZOFRAN) 4 MG tablet Take 4 mg by mouth every 8 (eight) hours as needed for nausea or vomiting.   Yes Historical Provider, MD  pantoprazole (PROTONIX) 40 MG tablet Take 1 tablet (40 mg total) by mouth daily before breakfast. 04/16/13  Yes Mahala Menghini, PA-C  QUEtiapine (SEROQUEL) 200 MG tablet Take 200 mg by mouth.   Yes Historical Provider, MD  sennosides-docusate sodium (SENOKOT-S) 8.6-50 MG tablet Take 2 tablets by mouth 2 (two) times daily.   Yes Historical Provider, MD  vitamin B-12 (CYANOCOBALAMIN) 1000 MCG tablet Take 1,000 mcg by mouth every morning.    Yes Historical Provider, MD  fluticasone (FLONASE) 50 MCG/ACT nasal spray Place 2 sprays into the nose daily. allergies     Historical Provider, MD  haloperidol lactate (HALDOL) 5 MG/ML injection Inject 1.5 mg into the muscle every 8 (eight) hours as needed (for agitation).    Historical Provider, MD    Social History:  reports that she quit smoking about 10 years ago. Her smoking use included Cigarettes. She has a 15 pack-year smoking history. She does not have any smokeless tobacco history on file. She reports that she does not drink alcohol or use illicit drugs.  Family History  Problem Relation Age of Onset  . Anesthesia problems Neg Hx   . Hypotension Neg Hx   . Malignant hyperthermia Neg Hx   . Pseudochol deficiency Neg Hx        Review of Systems:  As in  the history of present illness, the rest of the review of systems is unobtainable due to patient's altered mental status the  Physical Exam: Blood pressure 112/48, pulse 78, temperature 101 F (38.3 C), temperature source Rectal, resp. rate 26, SpO2 90.00%. Constitutional:   Patient is a demented female in no acute distress and cooperative with exam. Head: Normocephalic and atraumatic Mouth: Mucus membranes dry Eyes: PERRL, EOMI, conjunctivae normal Neck: Supple, No Thyromegaly Cardiovascular: RRR, S1 normal, S2 normal Pulmonary/Chest: CTAB, no wheezes, rales, or rhonchi Abdominal: Soft. Non-tender, non-distended, bowel sounds are normal, no masses, organomegaly, or guarding present.  Neurological: Alert but not oriented x3 moving all extremities Extremities : No Cyanosis, Clubbing or Edema  Labs on Admission:  Basic Metabolic Panel:  Recent Labs Lab 10/24/13 2220  NA 149*  K 4.1  CL 113*  CO2 19  GLUCOSE 181*  BUN 57*  CREATININE 2.33*  CALCIUM 9.6   Liver Function Tests:  Recent Labs Lab 10/24/13 2220  AST 11  ALT 5  ALKPHOS 120*  BILITOT 0.5  PROT 8.0  ALBUMIN 3.1*    Recent Labs Lab 10/24/13 2220  LIPASE 463*   No results found for this basename: AMMONIA,  in the last 168 hours CBC:  Recent Labs Lab 10/24/13 2220  WBC 9.4  NEUTROABS 7.0  HGB 9.5*  HCT 28.9*  MCV 86.5  PLT 265   Cardiac Enzymes:  Recent Labs Lab 10/24/13 2220  TROPONINI <0.30    BNP (last 3 results)  Recent Labs  10/24/13 2220  PROBNP 686.3*   CBG: No results found for this basename: GLUCAP,  in the last 168 hours  Radiological Exams on Admission: Dg Chest 1 View  10/24/2013   CLINICAL DATA:  Fever, cough and congestion  EXAM: CHEST - 1 VIEW  COMPARISON:  09/17/2013  FINDINGS: Cardiac silhouette is normal in size. The aorta is uncoiled. No mediastinal or hilar masses. No evidence of adenopathy.  Lungs are clear.  No pleural effusion.  No pneumothorax.  Bony thorax  is demineralized but intact.  IMPRESSION: No acute cardiopulmonary disease.   Electronically Signed   By: Lajean Manes M.D.   On: 10/24/2013 20:25   Ct Head Wo Contrast  10/24/2013   CLINICAL DATA:  Dementia and altered mental status.  Cough.  EXAM: CT HEAD WITHOUT CONTRAST  TECHNIQUE: Contiguous axial images were obtained from the base of the skull through the vertex without intravenous contrast.  COMPARISON:  05/01/2013  FINDINGS: Skull and Sinuses:Chronic anterior nasal septal defect. Debris or hemorrhage noted at the defect currently. There is inflammatory mucosal thickening scattered about the bilateral paranasal sinuses. No sinus effusion or osseous erosion.  Orbits: Left cataract resection.  Brain: No evidence of acute abnormality, such as acute infarction, hemorrhage, hydrocephalus, or mass lesion/mass effect. Stable pattern of chronic small vessel disease with ischemic gliosis confluent around the lateral ventricles. There is generalized cerebral volume loss with ex vacuo ventricular enlargement, pattern and severity stable from previous.  IMPRESSION: 1. No acute intracranial findings. 2. Chronic small vessel disease and brain atrophy. 3. Bilateral sinusitis.  Chronic anterior nasal septal perforation.   Electronically Signed   By: Jorje Guild M.D.   On: 10/24/2013 23:40       Assessment/Plan Active Problems:   Dementia with behavioral disturbance   UTI (lower urinary tract infection)   AKI (acute kidney injury)  hypernatremia  Altered mental status Patient has altered mental status due to UTI and dementia. Patient will be continued on the Rocephin 1 g IV every 24 hours. Follow the urine culture and blood cultures results.  Cough Patient had cough and denies the facility. Chest x-ray does not show infiltrate. Patient does have elevated BNP, we'll obtain an echocardiogram to rule out underlying cardiomyopathy  Elevated lipase, Patient has mild elevated lipase, and keep her n.p.o.  and treat with IV fluids. Recheck lipase in a.m.  Acute kidney injury Patient has acute kidney injury due to dehydration. We'll start IV normal saline at 75 mL per hour. Follow BMP in the morning.  Hypernatremia Secondary to dehydration, but stopped positive at 75 mL per hour. If sodium continues to be elevated but BUN creatinine improves. Consider starting D5 half-normal versus D5 1/4 normal saline in the morning.  UTI Patient has abnormal UA, has been started on IV Rocephin. Follow urine culture results.  Dementia Continue when necessary Haldol, seroquel  Hypertension Continue to amlodipine  DVT prophylaxis Lovenox  Code status: Patient is DO NOT RESUSCITATE  Family discussion: Discussed with patient's daughter at bedside   Time  Spent on Admission: 60 minutes  [facility] Pager: [phone] 10/25/2013, 12:54 AM  If 7PM-7AM, please contact night-coverage  [email or web address]  Password [email or web address]

## 2013-10-25 NOTE — Clinical Social Work Psychosocial (Signed)
Clinical Social Work Department BRIEF PSYCHOSOCIAL ASSESSMENT 10/25/2013  Patient:  [name] [name], [name] [name]     Account Number:  [id number]     Admit date:  10/24/2013  Clinical Social Worker:  Wyatt Haste  Date/Time:  10/25/2013 10:50 AM  Referred by:  CSW  Date Referred:  10/25/2013 Referred for  SNF Placement   Other Referral:   Interview type:  Family Other interview type:   [name] [name]- daughter    PSYCHOSOCIAL DATA Living Status:  FACILITY Admitted from facility:  [facility] Level of care:  [place] Primary support name:  [name] [name] Primary support relationship to patient:  CHILD, ADULT Degree of support available:   supportive    CURRENT CONCERNS Current Concerns  Post-Acute Placement   Other Concerns:    SOCIAL WORK ASSESSMENT / PLAN CSW met with pt's daughter, [name] [name] at bedside. Pt alert, but disoriented. She has been a resident at [facility] for about 8 years. Pt has 3 children who are all actively involved in care. [name] [name] reports they usually make decisions together. Pt had a bad cough and a fever yesterday and was brought to ED. She has dementia and is oriented to self only at baseline per Jackelyn Poling at [facility]. Pt is nursing level of care and okay to return. She requires assist with all ADLs and daughter said that she can sit in a wheelchair some.   Assessment/plan status:  Psychosocial Support/Ongoing Assessment of Needs Other assessment/ plan:   Information/referral to community resources:   [facility]    PATIENT'S/FAMILY'S RESPONSE TO PLAN OF CARE: Pt unable to discuss plan of care. Daughter reports positive feelings regarding return to [facility] when medically stable.      [name], [place]

## 2013-10-25 NOTE — Progress Notes (Addendum)
Dr. Legrand Rams notified of CT results and that patient had refused Lovenox and Norvasc earlier today.  No new orders at this time.

## 2013-10-25 NOTE — Progress Notes (Addendum)
Patient received Xanax po at 1558.  Continues to yell out and attempts to hit staff several times.  Daughter at bedside.  Dr. Anastasio Champion (on-call for Dr. Legrand Rams notified).  Sitter in place.  Gave order for one dose of Ativan 1 mg IV.

## 2013-10-25 NOTE — Progress Notes (Signed)
Patient pulled IV out.  Yelling and attempting to hit staff when trying to insert IV.

## 2013-10-25 NOTE — Progress Notes (Signed)
Patient has a strong, congested cough.  Patient's daughter asked about breathing treatments.  Dr. Legrand Rams paged.  Dr. Legrand Rams gave order for Duoneb and Robitussin prn.

## 2013-10-25 NOTE — Progress Notes (Signed)
  Echocardiogram 2D Echocardiogram has been performed.  [name] [name] 10/25/2013, 9:45 AM

## 2013-10-26 LAB — CBC
HEMATOCRIT: 25.8 % — AB (ref 36.0–46.0)
Hemoglobin: 8.4 g/dL — ABNORMAL LOW (ref 12.0–15.0)
MCH: 28.2 pg (ref 26.0–34.0)
MCHC: 32.6 g/dL (ref 30.0–36.0)
MCV: 86.6 fL (ref 78.0–100.0)
Platelets: 263 10*3/uL (ref 150–400)
RBC: 2.98 MIL/uL — ABNORMAL LOW (ref 3.87–5.11)
RDW: 14.9 % (ref 11.5–15.5)
WBC: 9 10*3/uL (ref 4.0–10.5)

## 2013-10-26 LAB — IRON AND TIBC
Iron: 32 ug/dL — ABNORMAL LOW (ref 42–135)
Saturation Ratios: 14 % — ABNORMAL LOW (ref 20–55)
TIBC: 233 ug/dL — ABNORMAL LOW (ref 250–470)
UIBC: 201 ug/dL (ref 125–400)

## 2013-10-26 LAB — BASIC METABOLIC PANEL
BUN: 44 mg/dL — ABNORMAL HIGH (ref 6–23)
CHLORIDE: 106 meq/L (ref 96–112)
CO2: 20 meq/L (ref 19–32)
Calcium: 8.6 mg/dL (ref 8.4–10.5)
Creatinine, Ser: 1.8 mg/dL — ABNORMAL HIGH (ref 0.50–1.10)
GFR calc Af Amer: 29 mL/min — ABNORMAL LOW (ref >90)
GFR calc non Af Amer: 25 mL/min — ABNORMAL LOW (ref >90)
Glucose, Bld: 123 mg/dL — ABNORMAL HIGH (ref 70–99)
Potassium: 3.5 mEq/L — ABNORMAL LOW (ref 3.7–5.3)
Sodium: 141 mEq/L (ref 137–147)

## 2013-10-26 LAB — FOLATE: Folate: 12.4 ng/mL

## 2013-10-26 LAB — RETICULOCYTES
RBC.: 3.09 MIL/uL — ABNORMAL LOW (ref 3.87–5.11)
RETIC CT PCT: 1.3 % (ref 0.4–3.1)
Retic Count, Absolute: 40.2 10*3/uL (ref 19.0–186.0)

## 2013-10-26 LAB — FERRITIN: Ferritin: 573 ng/mL — ABNORMAL HIGH (ref 10–291)

## 2013-10-26 LAB — GLUCOSE, CAPILLARY: Glucose-Capillary: 112 mg/dL — ABNORMAL HIGH (ref 70–99)

## 2013-10-26 LAB — VITAMIN B12: Vitamin B-12: 773 pg/mL (ref 211–911)

## 2013-10-26 LAB — LIPASE, BLOOD: Lipase: 274 U/L — ABNORMAL HIGH (ref 11–59)

## 2013-10-26 MED ORDER — DEXTROSE-NACL 5-0.45 % IV SOLN
INTRAVENOUS | Status: DC
  Administered 2013-10-26 – 2013-10-31 (×7): via INTRAVENOUS

## 2013-10-26 NOTE — Progress Notes (Signed)
Subjective: Patient is confused, disoriented and agitated. She had CT Scan of the abdomen which didn't any acute intra-abdominal lesion. No fever, nausea or vomiting. Her hypernatremia and renal function is improving.    Objective: Vital signs in last 24 hours: Temp:  [98.1 F (36.7 C)-98.8 F (37.1 C)] 98.8 F (37.1 C) (06/02 0529) Pulse Rate:  [54-88] 54 (06/02 0529) Resp:  [20] 20 (06/02 0529) BP: (109-144)/(52-84) 144/84 mmHg (06/02 0529) SpO2:  [93 %-97 %] 97 % (06/02 0529) Weight change:  Last BM Date:  (unknown; patient confused.)  Intake/Output from previous day: 06/01 0701 - 06/02 0700 In: 2276.3 [I.V.:2276.3] Out: -   PHYSICAL EXAM General appearance: alert and slowed mentation Resp: clear to auscultation bilaterally Cardio: S1, S2 normal GI: abdomen is soft and lax, tenderness of the lower quadrant. Bowel sound is positive Extremities: extremities normal, atraumatic, no cyanosis or edema  Lab Results:    @labtest @ [facility] No results found for this basename: PHART, PCO2, PO2ART, TCO2, HCO3,  in the last 72 hours CULTURES No results found for this or any previous visit (from the past 240 hour(s)). Studies/Results: Ct Abdomen Wo Contrast  10/25/2013   CLINICAL DATA:  Acute renal injury and recent urinary tract infection, abdominal pain, elevated lipase  EXAM: CT ABDOMEN WITHOUT CONTRAST  TECHNIQUE: Multidetector CT imaging of the abdomen was performed following the standard protocol without IV contrast.  COMPARISON:  CT abdomen pelvis - 09/17/2013  FINDINGS: The lack of intravenous contrast limits the ability to evaluate solid abdominal organs. The examination is further degraded secondary to patient motion artifact necessitating the acquisition of additional images.  There is an ill-defined punctate (approximately 6 mm) stone within the inferior pole the right kidney (coronal image 51, series 9). Calcifications about the left spine hilum are favored to be vascular in  etiology. No definite left-sided nephrolithiasis. Note is again made of a small right-sided extrarenal pelvis with mild ureterectasis of the imaged superior mid aspect of the right ureter, unchanged. No discrete stones are seen within the imaged course of either ureter. There is grossly unchanged bilateral grossly symmetric minimally age-related perinephric stranding.  Normal hepatic contour. There is an approximately 1.5 x 0.9 cm laminated gallstone within an otherwise normal-appearing gallbladder (image 42, series 9). No definite gallbladder wall thickening or pericholecystic fluid on this degraded noncontrast examination. No ascites.  Normal noncontrast appearance of the bilateral adrenal glands and spleen. The pancreas remains atrophic but without definitive peripancreatic stranding.  Scattered atherosclerotic plaque within a normal caliber abdominal aorta. Post IVC filter placement. No definite bulky retroperitoneal or mesenteric adenopathy within the imaged abdomen.  Ingested enteric contrast extends to the level of the distal small bowel. No definite evidence of enteric obstruction. No pneumoperitoneum, pneumatosis or portal venous gas.  Limited visualization of the lower thorax demonstrates consolidative airspace opacities with air bronchograms within the imaged medial basilar segment of the right lower lobe. There is minimal subsegmental atelectasis within the left lower lobe. No pleural effusion.  Borderline cardiomegaly. Suspected calcifications within the aortic valve leaflets. No pericardial effusion.  No acute or aggressive osseus abnormalities. Six non rib-bearing lumbar type vertebral bodies were noted on prior abdominal CT. Mild to moderate multilevel lumbar spine DDD, worse at L3-L4 and L6-S1 with disk space height loss, endplate irregularity and sclerosis there is minimal (approximately 7 mm) anterolisthesis of L5 upon L6, grossly unchanged.  There is mild diffuse body wall edema. Regional soft  tissues appear otherwise normal.  IMPRESSION: 1. No definite acute  findings on this motion degraded noncontrast examination. 2. Airspace opacities within the imaged right lower lobe worrisome for pneumonia. 3. Grossly unchanged punctate (approximately 6 mm) nonobstructing stone within this inferior pole of the right kidney. No evidence of left-sided nephrolithiasis. 4. Cholelithiasis without definite evidence of cholecystitis on this degraded noncontrast examination. Further evaluation with abdominal ultrasound could be performed as clinically indicated. 5. Borderline cardiomegaly with suspect calcifications within the aortic valve leaflets. Further evaluation cardiac echo could be performed as clinically indicated   Electronically Signed   By: Sandi Mariscal M.D.   On: 10/25/2013 12:48   Dg Chest 1 View  10/24/2013   CLINICAL DATA:  Fever, cough and congestion  EXAM: CHEST - 1 VIEW  COMPARISON:  09/17/2013  FINDINGS: Cardiac silhouette is normal in size. The aorta is uncoiled. No mediastinal or hilar masses. No evidence of adenopathy.  Lungs are clear.  No pleural effusion.  No pneumothorax.  Bony thorax is demineralized but intact.  IMPRESSION: No acute cardiopulmonary disease.   Electronically Signed   By: Lajean Manes M.D.   On: 10/24/2013 20:25   Ct Head Wo Contrast  10/24/2013   CLINICAL DATA:  Dementia and altered mental status.  Cough.  EXAM: CT HEAD WITHOUT CONTRAST  TECHNIQUE: Contiguous axial images were obtained from the base of the skull through the vertex without intravenous contrast.  COMPARISON:  05/01/2013  FINDINGS: Skull and Sinuses:Chronic anterior nasal septal defect. Debris or hemorrhage noted at the defect currently. There is inflammatory mucosal thickening scattered about the bilateral paranasal sinuses. No sinus effusion or osseous erosion.  Orbits: Left cataract resection.  Brain: No evidence of acute abnormality, such as acute infarction, hemorrhage, hydrocephalus, or mass lesion/mass  effect. Stable pattern of chronic small vessel disease with ischemic gliosis confluent around the lateral ventricles. There is generalized cerebral volume loss with ex vacuo ventricular enlargement, pattern and severity stable from previous.  IMPRESSION: 1. No acute intracranial findings. 2. Chronic small vessel disease and brain atrophy. 3. Bilateral sinusitis.  Chronic anterior nasal septal perforation.   Electronically Signed   By: Jorje Guild M.D.   On: 10/24/2013 23:40    Medications: I have reviewed the patient's current medications.  Assesment: Active Problems:   Dementia with behavioral disturbance   UTI (lower urinary tract infection)   AKI (acute kidney injury) hypernatremia elevated lipase Abdominal pain H/O cholelithiasis Anemia Elevated lipase    Plan: Medications reviewed Will continue iv antibiotics Will adjust iv fluid Will monitor cbc/bmp Anemia profile GI consult appreciated     LOS: 2 days   Jaden Abreu 10/26/2013, 8:09 AM

## 2013-10-26 NOTE — Progress Notes (Signed)
Subjective: Confused. NPO. No evidence of overt bleeding. Hgb appears to be in 8 range, just mildly decreased since admission.   Objective: Vital signs in last 24 hours: Temp:  [98.1 F (36.7 C)-98.8 F (37.1 C)] 98.8 F (37.1 C) (06/02 0529) Pulse Rate:  [54-88] 54 (06/02 0529) Resp:  [20] 20 (06/02 0529) BP: (109-144)/(52-84) 144/84 mmHg (06/02 0529) SpO2:  [93 %-97 %] 97 % (06/02 0529) Last BM Date:  (unknown; patient confused.) General:   Disoriented but less agitated today.  Abdomen:  Bowel sounds present, soft, non-tender, non-distended. Possible umbilical hernia. Neurologic:  Disoriented.   Intake/Output from previous day: 06/01 0701 - 06/02 0700 In: 2276.3 [I.V.:2276.3] Out: -  Intake/Output this shift:    Lab Results:  Recent Labs  10/24/13 2220 10/25/13 0605 10/26/13 0613  WBC 9.4 8.7 9.0  HGB 9.5* 9.0* 8.4*  HCT 28.9* 27.9* 25.8*  PLT 265 268 263   BMET  Recent Labs  10/24/13 2220 10/25/13 0605 10/26/13 0613  NA 149* 153* 141  K 4.1 4.1 3.5*  CL 113* 117* 106  CO2 19 21 20   GLUCOSE 181* 122* 123*  BUN 57* 60* 44*  CREATININE 2.33* 2.31* 1.80*  CALCIUM 9.6 9.3 8.6   LFT  Recent Labs  10/24/13 2220 10/25/13 0605  PROT 8.0 7.5  ALBUMIN 3.1* 2.9*  AST 11 11  ALT 5 5  ALKPHOS 120* 129*  BILITOT 0.5 0.4   Lab Results  Component Value Date   LIPASE 274* 10/26/2013     Studies/Results: Ct Abdomen Wo Contrast  10/25/2013   CLINICAL DATA:  Acute renal injury and recent urinary tract infection, abdominal pain, elevated lipase  EXAM: CT ABDOMEN WITHOUT CONTRAST  TECHNIQUE: Multidetector CT imaging of the abdomen was performed following the standard protocol without IV contrast.  COMPARISON:  CT abdomen pelvis - 09/17/2013  FINDINGS: The lack of intravenous contrast limits the ability to evaluate solid abdominal organs. The examination is further degraded secondary to patient motion artifact necessitating the acquisition of additional  images.  There is an ill-defined punctate (approximately 6 mm) stone within the inferior pole the right kidney (coronal image 51, series 9). Calcifications about the left spine hilum are favored to be vascular in etiology. No definite left-sided nephrolithiasis. Note is again made of a small right-sided extrarenal pelvis with mild ureterectasis of the imaged superior mid aspect of the right ureter, unchanged. No discrete stones are seen within the imaged course of either ureter. There is grossly unchanged bilateral grossly symmetric minimally age-related perinephric stranding.  Normal hepatic contour. There is an approximately 1.5 x 0.9 cm laminated gallstone within an otherwise normal-appearing gallbladder (image 42, series 9). No definite gallbladder wall thickening or pericholecystic fluid on this degraded noncontrast examination. No ascites.  Normal noncontrast appearance of the bilateral adrenal glands and spleen. The pancreas remains atrophic but without definitive peripancreatic stranding.  Scattered atherosclerotic plaque within a normal caliber abdominal aorta. Post IVC filter placement. No definite bulky retroperitoneal or mesenteric adenopathy within the imaged abdomen.  Ingested enteric contrast extends to the level of the distal small bowel. No definite evidence of enteric obstruction. No pneumoperitoneum, pneumatosis or portal venous gas.  Limited visualization of the lower thorax demonstrates consolidative airspace opacities with air bronchograms within the imaged medial basilar segment of the right lower lobe. There is minimal subsegmental atelectasis within the left lower lobe. No pleural effusion.  Borderline cardiomegaly. Suspected calcifications within the aortic valve leaflets. No pericardial effusion.  No acute  or aggressive osseus abnormalities. Six non rib-bearing lumbar type vertebral bodies were noted on prior abdominal CT. Mild to moderate multilevel lumbar spine DDD, worse at L3-L4 and  L6-S1 with disk space height loss, endplate irregularity and sclerosis there is minimal (approximately 7 mm) anterolisthesis of L5 upon L6, grossly unchanged.  There is mild diffuse body wall edema. Regional soft tissues appear otherwise normal.  IMPRESSION: 1. No definite acute findings on this motion degraded noncontrast examination. 2. Airspace opacities within the imaged right lower lobe worrisome for pneumonia. 3. Grossly unchanged punctate (approximately 6 mm) nonobstructing stone within this inferior pole of the right kidney. No evidence of left-sided nephrolithiasis. 4. Cholelithiasis without definite evidence of cholecystitis on this degraded noncontrast examination. Further evaluation with abdominal ultrasound could be performed as clinically indicated. 5. Borderline cardiomegaly with suspect calcifications within the aortic valve leaflets. Further evaluation cardiac echo could be performed as clinically indicated   Electronically Signed   By: Sandi Mariscal M.D.   On: 10/25/2013 12:48   Dg Chest 1 View  10/24/2013   CLINICAL DATA:  Fever, cough and congestion  EXAM: CHEST - 1 VIEW  COMPARISON:  09/17/2013  FINDINGS: Cardiac silhouette is normal in size. The aorta is uncoiled. No mediastinal or hilar masses. No evidence of adenopathy.  Lungs are clear.  No pleural effusion.  No pneumothorax.  Bony thorax is demineralized but intact.  IMPRESSION: No acute cardiopulmonary disease.   Electronically Signed   By: Lajean Manes M.D.   On: 10/24/2013 20:25   Ct Head Wo Contrast  10/24/2013   CLINICAL DATA:  Dementia and altered mental status.  Cough.  EXAM: CT HEAD WITHOUT CONTRAST  TECHNIQUE: Contiguous axial images were obtained from the base of the skull through the vertex without intravenous contrast.  COMPARISON:  05/01/2013  FINDINGS: Skull and Sinuses:Chronic anterior nasal septal defect. Debris or hemorrhage noted at the defect currently. There is inflammatory mucosal thickening scattered about the  bilateral paranasal sinuses. No sinus effusion or osseous erosion.  Orbits: Left cataract resection.  Brain: No evidence of acute abnormality, such as acute infarction, hemorrhage, hydrocephalus, or mass lesion/mass effect. Stable pattern of chronic small vessel disease with ischemic gliosis confluent around the lateral ventricles. There is generalized cerebral volume loss with ex vacuo ventricular enlargement, pattern and severity stable from previous.  IMPRESSION: 1. No acute intracranial findings. 2. Chronic small vessel disease and brain atrophy. 3. Bilateral sinusitis.  Chronic anterior nasal septal perforation.   Electronically Signed   By: Jorje Guild M.D.   On: 10/24/2013 23:40    Assessment: 80 year old female with history of dementia, presenting with UTI, dehydration, and elevated lipase in the setting of acute renal failure. CT without obvious changes of pancreatitis. Pancreatic atrophy noted. In the setting of acute renal failure, elevated lipase non-specific. Lipase improved this morning, renal function improved.  Would recommend supportive care at this point, increase to clear liquids and advance as tolerated.   Anemia: chronic, small drop in Hgb likely multifactorial, dilutional. No signs of overt GI bleeding. Anemia panel pending.   Plan: Clear liquids Protonix each morning Follow-up on pending anemia panel Lipase in am   Orvil Feil, ANP-BC [facility]    LOS: 2 days    10/26/2013, 12:01 PM

## 2013-10-27 LAB — BASIC METABOLIC PANEL
BUN: 30 mg/dL — ABNORMAL HIGH (ref 6–23)
CHLORIDE: 107 meq/L (ref 96–112)
CO2: 20 mEq/L (ref 19–32)
Calcium: 8.7 mg/dL (ref 8.4–10.5)
Creatinine, Ser: 1.56 mg/dL — ABNORMAL HIGH (ref 0.50–1.10)
GFR calc non Af Amer: 30 mL/min — ABNORMAL LOW (ref >90)
GFR, EST AFRICAN AMERICAN: 35 mL/min — AB (ref >90)
Glucose, Bld: 110 mg/dL — ABNORMAL HIGH (ref 70–99)
Potassium: 3.6 mEq/L — ABNORMAL LOW (ref 3.7–5.3)
Sodium: 142 mEq/L (ref 137–147)

## 2013-10-27 LAB — CBC
HCT: 26.3 % — ABNORMAL LOW (ref 36.0–46.0)
Hemoglobin: 8.7 g/dL — ABNORMAL LOW (ref 12.0–15.0)
MCH: 28.2 pg (ref 26.0–34.0)
MCHC: 33.1 g/dL (ref 30.0–36.0)
MCV: 85.1 fL (ref 78.0–100.0)
PLATELETS: 268 10*3/uL (ref 150–400)
RBC: 3.09 MIL/uL — ABNORMAL LOW (ref 3.87–5.11)
RDW: 14.5 % (ref 11.5–15.5)
WBC: 9.6 10*3/uL (ref 4.0–10.5)

## 2013-10-27 LAB — LIPASE, BLOOD: LIPASE: 294 U/L — AB (ref 11–59)

## 2013-10-27 LAB — HEPATIC FUNCTION PANEL
ALT: 9 U/L (ref 0–35)
AST: 21 U/L (ref 0–37)
Albumin: 2.6 g/dL — ABNORMAL LOW (ref 3.5–5.2)
Alkaline Phosphatase: 151 U/L — ABNORMAL HIGH (ref 39–117)
Total Bilirubin: 0.4 mg/dL (ref 0.3–1.2)
Total Protein: 7 g/dL (ref 6.0–8.3)

## 2013-10-27 MED ORDER — PANTOPRAZOLE SODIUM 40 MG PO PACK
40.0000 mg | PACK | Freq: Every day | ORAL | Status: DC
  Administered 2013-10-27 – 2013-11-01 (×5): 40 mg via ORAL
  Filled 2013-10-27 (×8): qty 20

## 2013-10-27 NOTE — Progress Notes (Signed)
Subjective: Patient is resting. She is confused and disoriented but less agitated. She is tolerating her diet. Her renal function is improving. Objective: Vital signs in last 24 hours: Temp:  [98.1 F (36.7 C)-98.7 F (37.1 C)] 98.1 F (36.7 C) (06/03 0555) Pulse Rate:  [60-80] 60 (06/02 1950) Resp:  [20] 20 (06/03 0555) BP: (122-153)/(60-66) 129/60 mmHg (06/03 0555) SpO2:  [96 %-98 %] 97 % (06/03 0555) Weight change:  Last BM Date:  (unknown; patient confused.)  Intake/Output from previous day: 06/02 0701 - 06/03 0700 In: 1031.7 [P.O.:240; I.V.:791.7] Out: -   PHYSICAL EXAM General appearance: alert and slowed mentation Resp: clear to auscultation bilaterally Cardio: S1, S2 normal GI: abdomen is soft and lax, tenderness of the lower quadrant. Bowel sound is positive Extremities: extremities normal, atraumatic, no cyanosis or edema  Lab Results:    @labtest @ [facility] No results found for this basename: PHART, PCO2, PO2ART, TCO2, HCO3,  in the last 72 hours CULTURES Recent Results (from the past 240 hour(s))  URINE CULTURE     Status: None   Collection Time    10/24/13 10:35 PM      Result Value Ref Range Status   Specimen Description URINE, CATHETERIZED   Final   Special Requests NONE   Final   Culture  Setup Time     Final   Value: 10/25/2013 14:25     Performed at [facility] PENDING   Incomplete   Culture     Final   Value: Culture reincubated for better growth     Performed at [facility]   Report Status PENDING   Incomplete   Studies/Results: Ct Abdomen Wo Contrast  10/25/2013   CLINICAL DATA:  Acute renal injury and recent urinary tract infection, abdominal pain, elevated lipase  EXAM: CT ABDOMEN WITHOUT CONTRAST  TECHNIQUE: Multidetector CT imaging of the abdomen was performed following the standard protocol without IV contrast.  COMPARISON:  CT abdomen pelvis - 09/17/2013  FINDINGS: The lack of intravenous contrast limits the  ability to evaluate solid abdominal organs. The examination is further degraded secondary to patient motion artifact necessitating the acquisition of additional images.  There is an ill-defined punctate (approximately 6 mm) stone within the inferior pole the right kidney (coronal image 51, series 9). Calcifications about the left spine hilum are favored to be vascular in etiology. No definite left-sided nephrolithiasis. Note is again made of a small right-sided extrarenal pelvis with mild ureterectasis of the imaged superior mid aspect of the right ureter, unchanged. No discrete stones are seen within the imaged course of either ureter. There is grossly unchanged bilateral grossly symmetric minimally age-related perinephric stranding.  Normal hepatic contour. There is an approximately 1.5 x 0.9 cm laminated gallstone within an otherwise normal-appearing gallbladder (image 42, series 9). No definite gallbladder wall thickening or pericholecystic fluid on this degraded noncontrast examination. No ascites.  Normal noncontrast appearance of the bilateral adrenal glands and spleen. The pancreas remains atrophic but without definitive peripancreatic stranding.  Scattered atherosclerotic plaque within a normal caliber abdominal aorta. Post IVC filter placement. No definite bulky retroperitoneal or mesenteric adenopathy within the imaged abdomen.  Ingested enteric contrast extends to the level of the distal small bowel. No definite evidence of enteric obstruction. No pneumoperitoneum, pneumatosis or portal venous gas.  Limited visualization of the lower thorax demonstrates consolidative airspace opacities with air bronchograms within the imaged medial basilar segment of the right lower lobe. There is minimal subsegmental atelectasis within  the left lower lobe. No pleural effusion.  Borderline cardiomegaly. Suspected calcifications within the aortic valve leaflets. No pericardial effusion.  No acute or aggressive osseus  abnormalities. Six non rib-bearing lumbar type vertebral bodies were noted on prior abdominal CT. Mild to moderate multilevel lumbar spine DDD, worse at L3-L4 and L6-S1 with disk space height loss, endplate irregularity and sclerosis there is minimal (approximately 7 mm) anterolisthesis of L5 upon L6, grossly unchanged.  There is mild diffuse body wall edema. Regional soft tissues appear otherwise normal.  IMPRESSION: 1. No definite acute findings on this motion degraded noncontrast examination. 2. Airspace opacities within the imaged right lower lobe worrisome for pneumonia. 3. Grossly unchanged punctate (approximately 6 mm) nonobstructing stone within this inferior pole of the right kidney. No evidence of left-sided nephrolithiasis. 4. Cholelithiasis without definite evidence of cholecystitis on this degraded noncontrast examination. Further evaluation with abdominal ultrasound could be performed as clinically indicated. 5. Borderline cardiomegaly with suspect calcifications within the aortic valve leaflets. Further evaluation cardiac echo could be performed as clinically indicated   Electronically Signed   By: Sandi Mariscal M.D.   On: 10/25/2013 12:48    Medications: I have reviewed the patient's current medications.  Assesment: Active Problems:   Dementia with behavioral disturbance   UTI (lower urinary tract infection)   AKI (acute kidney injury) hypernatremia elevated lipase Abdominal pain H/O cholelithiasis Anemia Elevated lipase    Plan: Medications reviewed Will continue iv antibiotics Will continue to monitor CBC/BMP Will continue GI recommendation    LOS: 3 days   Maritsa Hunsucker 10/27/2013, 7:58 AM

## 2013-10-27 NOTE — Progress Notes (Addendum)
Subjective: Singing hymns. Unable to obtain information from patient. Appears she ate all breakfast assisted.   Objective: Vital signs in last 24 hours: Temp:  [98.1 F (36.7 C)-98.7 F (37.1 C)] 98.1 F (36.7 C) (06/03 0555) Pulse Rate:  [60-80] 60 (06/02 1950) Resp:  [20] 20 (06/03 0555) BP: (122-153)/(60-66) 129/60 mmHg (06/03 0555) SpO2:  [96 %-98 %] 97 % (06/03 0555) Last BM Date:  (unknown; patient confused.) General:   Confused, singing hymns.  Abdomen:  Bowel sounds present, soft, non-tender, non-distended. No HSM or hernias noted. No rebound or guarding. No masses appreciated  Extremities:  Without  Edema. Mitt restraints on hands.  Neurologic:  Confused, disoriented, calmer today.    Intake/Output from previous day: 06/02 0701 - 06/03 0700 In: 1031.7 [P.O.:240; I.V.:791.7] Out: -  Intake/Output this shift: Total I/O In: 120 [P.O.:120] Out: -   Lab Results:  Recent Labs  10/25/13 0605 10/26/13 0613 10/27/13 0557  WBC 8.7 9.0 9.6  HGB 9.0* 8.4* 8.7*  HCT 27.9* 25.8* 26.3*  PLT 268 263 268   BMET  Recent Labs  10/25/13 0605 10/26/13 0613 10/27/13 0557  NA 153* 141 142  K 4.1 3.5* 3.6*  CL 117* 106 107  CO2 '21 20 20  ' GLUCOSE 122* 123* 110*  BUN 60* 44* 30*  CREATININE 2.31* 1.80* 1.56*  CALCIUM 9.3 8.6 8.7   LFT  Recent Labs  10/24/13 2220 10/25/13 0605  PROT 8.0 7.5  ALBUMIN 3.1* 2.9*  AST 11 11  ALT 5 5  ALKPHOS 120* 129*  BILITOT 0.5 0.4   Lab Results  Component Value Date   IRON 32* 10/26/2013   TIBC 233* 10/26/2013   FERRITIN 573* 10/26/2013    Studies/Results: Ct Abdomen Wo Contrast  10/25/2013   CLINICAL DATA:  Acute renal injury and recent urinary tract infection, abdominal pain, elevated lipase  EXAM: CT ABDOMEN WITHOUT CONTRAST  TECHNIQUE: Multidetector CT imaging of the abdomen was performed following the standard protocol without IV contrast.  COMPARISON:  CT abdomen pelvis - 09/17/2013  FINDINGS: The lack of intravenous  contrast limits the ability to evaluate solid abdominal organs. The examination is further degraded secondary to patient motion artifact necessitating the acquisition of additional images.  There is an ill-defined punctate (approximately 6 mm) stone within the inferior pole the right kidney (coronal image 51, series 9). Calcifications about the left spine hilum are favored to be vascular in etiology. No definite left-sided nephrolithiasis. Note is again made of a small right-sided extrarenal pelvis with mild ureterectasis of the imaged superior mid aspect of the right ureter, unchanged. No discrete stones are seen within the imaged course of either ureter. There is grossly unchanged bilateral grossly symmetric minimally age-related perinephric stranding.  Normal hepatic contour. There is an approximately 1.5 x 0.9 cm laminated gallstone within an otherwise normal-appearing gallbladder (image 42, series 9). No definite gallbladder wall thickening or pericholecystic fluid on this degraded noncontrast examination. No ascites.  Normal noncontrast appearance of the bilateral adrenal glands and spleen. The pancreas remains atrophic but without definitive peripancreatic stranding.  Scattered atherosclerotic plaque within a normal caliber abdominal aorta. Post IVC filter placement. No definite bulky retroperitoneal or mesenteric adenopathy within the imaged abdomen.  Ingested enteric contrast extends to the level of the distal small bowel. No definite evidence of enteric obstruction. No pneumoperitoneum, pneumatosis or portal venous gas.  Limited visualization of the lower thorax demonstrates consolidative airspace opacities with air bronchograms within the imaged medial basilar segment of  the right lower lobe. There is minimal subsegmental atelectasis within the left lower lobe. No pleural effusion.  Borderline cardiomegaly. Suspected calcifications within the aortic valve leaflets. No pericardial effusion.  No acute or  aggressive osseus abnormalities. Six non rib-bearing lumbar type vertebral bodies were noted on prior abdominal CT. Mild to moderate multilevel lumbar spine DDD, worse at L3-L4 and L6-S1 with disk space height loss, endplate irregularity and sclerosis there is minimal (approximately 7 mm) anterolisthesis of L5 upon L6, grossly unchanged.  There is mild diffuse body wall edema. Regional soft tissues appear otherwise normal.  IMPRESSION: 1. No definite acute findings on this motion degraded noncontrast examination. 2. Airspace opacities within the imaged right lower lobe worrisome for pneumonia. 3. Grossly unchanged punctate (approximately 6 mm) nonobstructing stone within this inferior pole of the right kidney. No evidence of left-sided nephrolithiasis. 4. Cholelithiasis without definite evidence of cholecystitis on this degraded noncontrast examination. Further evaluation with abdominal ultrasound could be performed as clinically indicated. 5. Borderline cardiomegaly with suspect calcifications within the aortic valve leaflets. Further evaluation cardiac echo could be performed as clinically indicated   Electronically Signed   By: Sandi Mariscal M.D.   On: 10/25/2013 12:48    Assessment: 80 year old female with history of dementia, presenting with UTI, dehydration, and non-specific elevated lipase in the setting of acute renal failure. CT without obvious changes of pancreatitis. Pancreatic atrophy noted. In the setting of acute renal failure, elevated lipase non-specific. Would recommend supportive care at this point. Clinically stable from a GI standpoint.   Anemia: chronic, small drop in Hgb likely multifactorial, dilutional. No signs of overt GI bleeding. Ferritin elevated, iron and TIBC low. Mixed anemia likely. EGD on file from April 2015 with chronic gastritis. Doubt she would tolerate prep for a colonoscopy.   Mild elevation Alk Phos: non-specific. Update LFTs today.    Plan: Advance diet Protonix  each morning Follow Hgb; likely multifactorial Recommend cardiac echo due to findings from CT; will defer to attending. HFP today  Orvil Feil, ANP-BC [facility]    LOS: 3 days    10/27/2013, 10:50 AM    Addendum: LFTs okay except for an alkaline phosphatase 151/albumin 2.6

## 2013-10-28 DIAGNOSIS — D649 Anemia, unspecified: Secondary | ICD-10-CM

## 2013-10-28 DIAGNOSIS — R748 Abnormal levels of other serum enzymes: Secondary | ICD-10-CM

## 2013-10-28 LAB — CBC
HCT: 26.5 % — ABNORMAL LOW (ref 36.0–46.0)
HEMOGLOBIN: 8.7 g/dL — AB (ref 12.0–15.0)
MCH: 27.9 pg (ref 26.0–34.0)
MCHC: 32.8 g/dL (ref 30.0–36.0)
MCV: 84.9 fL (ref 78.0–100.0)
Platelets: 289 10*3/uL (ref 150–400)
RBC: 3.12 MIL/uL — AB (ref 3.87–5.11)
RDW: 14.3 % (ref 11.5–15.5)
WBC: 11.7 10*3/uL — ABNORMAL HIGH (ref 4.0–10.5)

## 2013-10-28 LAB — URINE CULTURE

## 2013-10-28 LAB — BASIC METABOLIC PANEL
BUN: 21 mg/dL (ref 6–23)
CALCIUM: 9 mg/dL (ref 8.4–10.5)
CHLORIDE: 107 meq/L (ref 96–112)
CO2: 20 meq/L (ref 19–32)
Creatinine, Ser: 1.38 mg/dL — ABNORMAL HIGH (ref 0.50–1.10)
GFR calc Af Amer: 40 mL/min — ABNORMAL LOW (ref >90)
GFR calc non Af Amer: 35 mL/min — ABNORMAL LOW (ref >90)
GLUCOSE: 128 mg/dL — AB (ref 70–99)
Potassium: 3.3 mEq/L — ABNORMAL LOW (ref 3.7–5.3)
SODIUM: 144 meq/L (ref 137–147)

## 2013-10-28 MED ORDER — POTASSIUM CHLORIDE 10 MEQ/100ML IV SOLN
10.0000 meq | INTRAVENOUS | Status: AC
  Administered 2013-10-28 (×3): 10 meq via INTRAVENOUS
  Filled 2013-10-28 (×3): qty 100

## 2013-10-28 NOTE — Clinical Social Work Note (Signed)
CSW updated [facility] on pt. Probable d/c tomorrow per CM who discussed with MD this morning.   [name] [name], [place]

## 2013-10-28 NOTE — Progress Notes (Signed)
Subjective: Patient is resting. She remained confused and disoriented. No fever or chills. She is receiving iv antibiotics. Her renal function has improved. Objective: Vital signs in last 24 hours: Temp:  [97.6 F (36.4 C)-98.6 F (37 C)] 97.9 F (36.6 C) (06/04 0611) Pulse Rate:  [60-65] 65 (06/04 0611) Resp:  [20] 20 (06/04 0611) BP: (116-159)/(51-109) 132/51 mmHg (06/04 0611) SpO2:  [95 %-97 %] 95 % (06/04 0611) Weight change:  Last BM Date:  (pt unable to tell)  Intake/Output from previous day: 06/03 0701 - 06/04 0700 In: 880 [P.O.:230; I.V.:600; IV Piggyback:50] Out: -   PHYSICAL EXAM General appearance: alert and slowed mentation Resp: clear to auscultation bilaterally Cardio: S1, S2 normal GI: abdomen is soft and lax, tenderness of the lower quadrant. Bowel sound is positive Extremities: extremities normal, atraumatic, no cyanosis or edema  Lab Results:    @labtest @ [facility] No results found for this basename: PHART, PCO2, PO2ART, TCO2, HCO3,  in the last 72 hours CULTURES Recent Results (from the past 240 hour(s))  URINE CULTURE     Status: None   Collection Time    10/24/13 10:35 PM      Result Value Ref Range Status   Specimen Description URINE, CATHETERIZED   Final   Special Requests NONE   Final   Culture  Setup Time     Final   Value: 10/25/2013 14:25     Performed at [facility]     Final   Value: 15,000 COLONIES/ML     Performed at [facility]   Culture     Final   Value: STAPHYLOCOCCUS SPECIES (COAGULASE NEGATIVE)     Note: RIFAMPIN AND GENTAMICIN SHOULD NOT BE USED AS SINGLE DRUGS FOR TREATMENT OF STAPH INFECTIONS.     Performed at [facility]   Report Status PENDING   Incomplete   Studies/Results: No results found.  Medications: I have reviewed the patient's current medications.  Assesment: Active Problems:   Dementia with behavioral disturbance   UTI (lower urinary tract infection)   AKI (acute  kidney injury) hypernatremia elevated lipase Abdominal pain H/O cholelithiasis Anemia Elevated lipase hypokalemia   Plan: Medications reviewed Will continue iv antibiotics Will supplement KCL Will monitor CBC/BMP    LOS: 4 days   Aidian Salomon 10/28/2013, 7:52 AM

## 2013-10-28 NOTE — Progress Notes (Signed)
Subjective:  Resting comfortably. Daughter at bedside. Patient confused.   Objective: Vital signs in last 24 hours: Temp:  [97.6 F (36.4 C)-98.6 F (37 C)] 97.9 F (36.6 C) (06/04 0611) Pulse Rate:  [60-65] 65 (06/04 0611) Resp:  [20] 20 (06/04 0611) BP: (116-159)/(51-109) 132/51 mmHg (06/04 0611) SpO2:  [95 %-97 %] 95 % (06/04 0611) Last BM Date:  (pt unable to tell) General:   Alert,  Well-developed, well-nourished, pleasant and cooperative in NAD Head:  Normocephalic and atraumatic. Abdomen:  Soft, nontender and nondistended. No masses, hepatosplenomegaly or hernias noted. Normal bowel sounds, without guarding, and without rebound.   Extremities:  Without clubbing, deformity or edema. Neurologic:  Alert and  oriented x4;  grossly normal neurologically. Skin:  Intact without significant lesions or rashes. Psych:  Alert and cooperative. Normal mood and affect.  Intake/Output from previous day: 06/03 0701 - 06/04 0700 In: 880 [P.O.:230; I.V.:600; IV Piggyback:50] Out: -  Intake/Output this shift:    Lab Results: CBC  Recent Labs  10/26/13 0613 10/27/13 0557 10/28/13 0528  WBC 9.0 9.6 11.7*  HGB 8.4* 8.7* 8.7*  HCT 25.8* 26.3* 26.5*  MCV 86.6 85.1 84.9  PLT 263 268 289   BMET  Recent Labs  10/26/13 0613 10/27/13 0557 10/28/13 0528  NA 141 142 144  K 3.5* 3.6* 3.3*  CL 106 107 107  CO2 '20 20 20  ' GLUCOSE 123* 110* 128*  BUN 44* 30* 21  CREATININE 1.80* 1.56* 1.38*  CALCIUM 8.6 8.7 9.0   LFTs  Recent Labs  10/27/13 0557  BILITOT 0.4  BILIDIR <0.2  IBILI NOT CALCULATED  ALKPHOS 151*  AST 21  ALT 9  PROT 7.0  ALBUMIN 2.6*    Recent Labs  10/26/13 6004 10/27/13 0557  LIPASE 274* 294*   PT/INR No results found for this basename: LABPROT, INR,  in the last 72 hours    Imaging Studies: Ct Abdomen Wo Contrast  Nov 02, 2013   CLINICAL DATA:  Acute renal injury and recent urinary tract infection, abdominal pain, elevated lipase  EXAM: CT ABDOMEN  WITHOUT CONTRAST  TECHNIQUE: Multidetector CT imaging of the abdomen was performed following the standard protocol without IV contrast.  COMPARISON:  CT abdomen pelvis - 09/17/2013  FINDINGS: The lack of intravenous contrast limits the ability to evaluate solid abdominal organs. The examination is further degraded secondary to patient motion artifact necessitating the acquisition of additional images.  There is an ill-defined punctate (approximately 6 mm) stone within the inferior pole the right kidney (coronal image 51, series 9). Calcifications about the left spine hilum are favored to be vascular in etiology. No definite left-sided nephrolithiasis. Note is again made of a small right-sided extrarenal pelvis with mild ureterectasis of the imaged superior mid aspect of the right ureter, unchanged. No discrete stones are seen within the imaged course of either ureter. There is grossly unchanged bilateral grossly symmetric minimally age-related perinephric stranding.  Normal hepatic contour. There is an approximately 1.5 x 0.9 cm laminated gallstone within an otherwise normal-appearing gallbladder (image 42, series 9). No definite gallbladder wall thickening or pericholecystic fluid on this degraded noncontrast examination. No ascites.  Normal noncontrast appearance of the bilateral adrenal glands and spleen. The pancreas remains atrophic but without definitive peripancreatic stranding.  Scattered atherosclerotic plaque within a normal caliber abdominal aorta. Post IVC filter placement. No definite bulky retroperitoneal or mesenteric adenopathy within the imaged abdomen.  Ingested enteric contrast extends to the level of the distal small bowel. No definite evidence  of enteric obstruction. No pneumoperitoneum, pneumatosis or portal venous gas.  Limited visualization of the lower thorax demonstrates consolidative airspace opacities with air bronchograms within the imaged medial basilar segment of the right lower lobe.  There is minimal subsegmental atelectasis within the left lower lobe. No pleural effusion.  Borderline cardiomegaly. Suspected calcifications within the aortic valve leaflets. No pericardial effusion.  No acute or aggressive osseus abnormalities. Six non rib-bearing lumbar type vertebral bodies were noted on prior abdominal CT. Mild to moderate multilevel lumbar spine DDD, worse at L3-L4 and L6-S1 with disk space height loss, endplate irregularity and sclerosis there is minimal (approximately 7 mm) anterolisthesis of L5 upon L6, grossly unchanged.  There is mild diffuse body wall edema. Regional soft tissues appear otherwise normal.  IMPRESSION: 1. No definite acute findings on this motion degraded noncontrast examination. 2. Airspace opacities within the imaged right lower lobe worrisome for pneumonia. 3. Grossly unchanged punctate (approximately 6 mm) nonobstructing stone within this inferior pole of the right kidney. No evidence of left-sided nephrolithiasis. 4. Cholelithiasis without definite evidence of cholecystitis on this degraded noncontrast examination. Further evaluation with abdominal ultrasound could be performed as clinically indicated. 5. Borderline cardiomegaly with suspect calcifications within the aortic valve leaflets. Further evaluation cardiac echo could be performed as clinically indicated   Electronically Signed   By: Sandi Mariscal M.D.   On: 10/25/2013 12:48       Assessment: 80 year old female with history of dementia, presenting with UTI, dehydration, and non-specific elevated lipase in the setting of acute renal failure. CT without obvious changes of pancreatitis. Pancreatic atrophy noted. In the setting of acute renal failure, elevated lipase non-specific. Would recommend supportive care at this point. Clinically stable from a GI standpoint. H/o cholelithiasis and daughter reports evaluation by Dr. Arnoldo Morale previously and surgery not advised at that time. Cannot exclude biliary  pancreatitis but CT is reassuring.   Anemia: chronic, small drop in Hgb likely multifactorial, dilutional. H/H stable. No signs of overt GI bleeding. Ferritin elevated, iron and TIBC low. Mixed anemia likely. EGD on file from April 2015 with chronic gastritis, no h.pylori. Doubt she would tolerate prep for a colonoscopy.   Mild elevation Alk Phos: non-specific. Previously normal one month ago.   Plan: 1. Daily PPI. 2. Will follow peripherally.  3. Discussed findings and plan with daughter who is agreeable.   LOS: 4 days   Mahala Menghini  10/28/2013, 8:31 AM  Attending note:  Patient seen and examined this morning. Discussed with daughter who was in attendance.  Daughter notes patient has more abdominal pain when patient is asked about abdominal pain. Her abdominal exam currently is benign. The daughter wishes a conservative approach. I feel in this setting, that is very reasonable. We'll see back on an as-needed basis.

## 2013-10-29 ENCOUNTER — Inpatient Hospital Stay (HOSPITAL_COMMUNITY): Payer: PRIVATE HEALTH INSURANCE

## 2013-10-29 MED ORDER — DEXTROSE 5 % IV SOLN
1.0000 g | Freq: Two times a day (BID) | INTRAVENOUS | Status: DC
  Administered 2013-10-29 – 2013-10-31 (×5): 1 g via INTRAVENOUS
  Filled 2013-10-29 (×7): qty 1

## 2013-10-29 MED ORDER — SODIUM CHLORIDE 0.9 % IV SOLN
1250.0000 mg | Freq: Once | INTRAVENOUS | Status: AC
  Administered 2013-10-29: 1250 mg via INTRAVENOUS
  Filled 2013-10-29: qty 1250

## 2013-10-29 MED ORDER — VANCOMYCIN HCL IN DEXTROSE 1-5 GM/200ML-% IV SOLN
1000.0000 mg | INTRAVENOUS | Status: DC
  Filled 2013-10-29 (×3): qty 200

## 2013-10-29 NOTE — Progress Notes (Signed)
MD notified of patients temp of 102.6. Patient received 650mg  PO tylenol @0630 . Rechecked patients temp @ 0725 patients temp was 101.5. MD was notified and new orders were placed at this time. Will continue to monitor patient.

## 2013-10-29 NOTE — Progress Notes (Signed)
ANTIBIOTIC CONSULT NOTE - INITIAL  Pharmacy Consult for Vancomycin Indication: rule out pneumonia, fever  No Known Allergies  Patient Measurements: Height: 5' 2.99" (160 cm) Weight: 147 lb 7.8 oz (66.9 kg) IBW/kg (Calculated) : 52.38  Vital Signs: Temp: 101.5 F (38.6 C) (06/05 0723) Temp src: Axillary (06/05 0723) BP: 116/58 mmHg (06/05 0620) Pulse Rate: 67 (06/05 0620) Intake/Output from previous day: 06/04 0701 - 06/05 0700 In: 792.5 [P.O.:120; I.V.:372.5; IV Piggyback:300] Out: -  Intake/Output from this shift:    Labs:  Recent Labs  10/27/13 0557 10/28/13 0528  WBC 9.6 11.7*  HGB 8.7* 8.7*  PLT 268 289  CREATININE 1.56* 1.38*   Estimated Creatinine Clearance: 28.9 ml/min (by C-G formula based on Cr of 1.38). No results found for this basename: Letta Median, VANCORANDOM, GENTTROUGH, GENTPEAK, GENTRANDOM, TOBRATROUGH, TOBRAPEAK, TOBRARND, AMIKACINPEAK, AMIKACINTROU, AMIKACIN,  in the last 72 hours   Microbiology: Recent Results (from the past 720 hour(s))  URINE CULTURE     Status: None   Collection Time    10/24/13 10:35 PM      Result Value Ref Range Status   Specimen Description URINE, CATHETERIZED   Final   Special Requests NONE   Final   Culture  Setup Time     Final   Value: 10/25/2013 14:25     Performed at [place]     Final   Value: 15,000 COLONIES/ML     Performed at [facility]   Culture     Final   Value: STAPHYLOCOCCUS SPECIES (COAGULASE NEGATIVE)     Note: RIFAMPIN AND GENTAMICIN SHOULD NOT BE USED AS SINGLE DRUGS FOR TREATMENT OF STAPH INFECTIONS.     Performed at [facility]   Report Status 10/28/2013 FINAL   Final   Organism ID, Bacteria STAPHYLOCOCCUS SPECIES (COAGULASE NEGATIVE)   Final   Medical History: Past Medical History  Diagnosis Date  . Chronic kidney disease     acute renal failure  . COPD (chronic obstructive pulmonary disease)   . Diabetes mellitus   . Depression    . DEMENTIA   . Hypertension   . Shortness of breath   . GERD (gastroesophageal reflux disease)   . Gallstones   . Diverticulosis   . Concern about mental disorder without diagnosis   . Dehydration   . Anxiety   . Dysphagia   . Anemia   . Cholelithiases   . Renal stones   . Hyperlipidemia   . Hypokalemia   . Dementia    Rocephin 6/1 >> 6/5 Cefepime 6/5 >> Vancomycin 6/5 >>  Assessment: 80yo female who was on Rocephin for UTI.  Pt spiked temp last pm and abx changed to Cefepime (per MD) and Vancomycin. Estimated Creatinine Clearance: 28.9 ml/min (by C-G formula based on Cr of 1.38). Urine culture with 15K colonies CONS (SENS to Vanc)  Goal of Therapy:  Vancomycin trough level 15-20 mcg/ml Eradicate infection.  Plan:  Continue Cefepime 1gm IV q12hrs per MD  F/U SCr tomorrow, adjust dosing if needed Vancomycin 1250mg  IV today x 1 (loading dose) then Vancomycin 1000mg  IV q24hrs Check trough at steady state Monitor labs, renal fxn, and cultures  Mahathi Pokorney A Annalaya Wile 10/29/2013,7:53 AM

## 2013-10-29 NOTE — Clinical Social Work Note (Signed)
CSW updated Larraine at [facility] re patient status, do not anticipate discharge today.  Daughter in room wanted to talk w CSW re bed hold at [place].  CSW discussed possibility that bed would not be available at [facility] at discharge unless bed hold is paid.  Sister will confer w siblings and decide course of action.    Edwyna Shell, LCSW Clinical Social Worker [phone])

## 2013-10-29 NOTE — Progress Notes (Signed)
Subjective: Patient started spiking since last night. Her CBC also slightly increased. She alert, awake but confused and disoriented. Her CT of the abdomen showed questionable pneumonia. Patient was on Rocephin for possible UTI . Objective: Vital signs in last 24 hours: Temp:  [99.2 F (37.3 C)-102.6 F (39.2 C)] 101.5 F (38.6 C) (06/05 0723) Pulse Rate:  [47-67] 67 (06/05 0620) Resp:  [14-20] 14 (06/05 0620) BP: (116-157)/(56-58) 116/58 mmHg (06/05 0620) SpO2:  [93 %-96 %] 93 % (06/05 0620) Weight change:  Last BM Date: 10/28/13  Intake/Output from previous day: 06/04 0701 - 06/05 0700 In: 792.5 [P.O.:120; I.V.:372.5; IV Piggyback:300] Out: -   PHYSICAL EXAM General appearance: alert and slowed mentation Resp: clear to auscultation bilaterally Cardio: S1, S2 normal GI: abdomen is soft and lax, tenderness of the lower quadrant. Bowel sound is positive Extremities: extremities normal, atraumatic, no cyanosis or edema  Lab Results:    @labtest @ [facility] No results found for this basename: PHART, PCO2, PO2ART, TCO2, HCO3,  in the last 72 hours CULTURES Recent Results (from the past 240 hour(s))  URINE CULTURE     Status: None   Collection Time    10/24/13 10:35 PM      Result Value Ref Range Status   Specimen Description URINE, CATHETERIZED   Final   Special Requests NONE   Final   Culture  Setup Time     Final   Value: 10/25/2013 14:25     Performed at [place]     Final   Value: 15,000 COLONIES/ML     Performed at [facility]   Culture     Final   Value: STAPHYLOCOCCUS SPECIES (COAGULASE NEGATIVE)     Note: RIFAMPIN AND GENTAMICIN SHOULD NOT BE USED AS SINGLE DRUGS FOR TREATMENT OF STAPH INFECTIONS.     Performed at [facility]   Report Status 10/28/2013 FINAL   Final   Organism ID, Bacteria STAPHYLOCOCCUS SPECIES (COAGULASE NEGATIVE)   Final   Studies/Results: No results found.  Medications: I have reviewed the  patient's current medications.  Assesment: Active Problems:   Dementia with behavioral disturbance   UTI (lower urinary tract infection)   AKI (acute kidney injury) hypernatremia elevated lipase Abdominal pain H/O cholelithiasis Anemia Elevated lipase hypokalemia R/O HCAP  Plan: Medications reviewed Will do blood culture and chest x-ray Will change antibiotics to cefepime and vancomycin Will continue supportive care    LOS: 5 days   Axton Cihlar 10/29/2013, 7:46 AM

## 2013-10-30 LAB — BASIC METABOLIC PANEL
BUN: 21 mg/dL (ref 6–23)
CO2: 22 mEq/L (ref 19–32)
CREATININE: 1.76 mg/dL — AB (ref 0.50–1.10)
Calcium: 8.9 mg/dL (ref 8.4–10.5)
Chloride: 106 mEq/L (ref 96–112)
GFR calc Af Amer: 30 mL/min — ABNORMAL LOW (ref >90)
GFR, EST NON AFRICAN AMERICAN: 26 mL/min — AB (ref >90)
Glucose, Bld: 121 mg/dL — ABNORMAL HIGH (ref 70–99)
Potassium: 3.6 mEq/L — ABNORMAL LOW (ref 3.7–5.3)
Sodium: 141 mEq/L (ref 137–147)

## 2013-10-30 LAB — OCCULT BLOOD X 1 CARD TO LAB, STOOL: Fecal Occult Bld: NEGATIVE

## 2013-10-30 MED ORDER — VANCOMYCIN HCL IN DEXTROSE 750-5 MG/150ML-% IV SOLN
750.0000 mg | INTRAVENOUS | Status: DC
  Filled 2013-10-30 (×2): qty 150

## 2013-10-30 MED ORDER — VANCOMYCIN HCL IN DEXTROSE 750-5 MG/150ML-% IV SOLN
750.0000 mg | INTRAVENOUS | Status: DC
  Administered 2013-10-30 – 2013-10-31 (×2): 750 mg via INTRAVENOUS
  Filled 2013-10-30 (×4): qty 150

## 2013-10-30 NOTE — Progress Notes (Signed)
10/30/13 1739 Patient had small dark stool, hemoccult obtained per order. Donavan Foil, RN

## 2013-10-30 NOTE — Progress Notes (Signed)
Subjective: Patient is more alert and awake today. Her fever is subsiding. No nausea or vomiting. Objective: Vital signs in last 24 hours: Temp:  [97.4 F (36.3 C)-100.2 F (37.9 C)] 97.4 F (36.3 C) (06/06 0550) Pulse Rate:  [63-69] 69 (06/06 0550) Resp:  [16-18] 18 (06/06 0550) BP: (107-146)/(37-62) 107/44 mmHg (06/06 0550) SpO2:  [93 %-100 %] 94 % (06/06 0550) Weight change:  Last BM Date: 10/28/13  Intake/Output from previous day: 06/05 0701 - 06/06 0700 In: 718.3 [P.O.:90; I.V.:328.3; IV Piggyback:300] Out: -   PHYSICAL EXAM General appearance: alert and slowed mentation Resp: clear to auscultation bilaterally Cardio: S1, S2 normal GI: abdomen is soft and lax, tenderness of the lower quadrant. Bowel sound is positive Extremities: extremities normal, atraumatic, no cyanosis or edema  Lab Results:    @labtest @ [facility] No results found for this basename: PHART, PCO2, PO2ART, TCO2, HCO3,  in the last 72 hours CULTURES Recent Results (from the past 240 hour(s))  URINE CULTURE     Status: None   Collection Time    10/24/13 10:35 PM      Result Value Ref Range Status   Specimen Description URINE, CATHETERIZED   Final   Special Requests NONE   Final   Culture  Setup Time     Final   Value: 10/25/2013 14:25     Performed at [place]     Final   Value: 15,000 COLONIES/ML     Performed at [facility]   Culture     Final   Value: STAPHYLOCOCCUS SPECIES (COAGULASE NEGATIVE)     Note: RIFAMPIN AND GENTAMICIN SHOULD NOT BE USED AS SINGLE DRUGS FOR TREATMENT OF STAPH INFECTIONS.     Performed at [facility]   Report Status 10/28/2013 FINAL   Final   Organism ID, Bacteria STAPHYLOCOCCUS SPECIES (COAGULASE NEGATIVE)   Final  CULTURE, BLOOD (ROUTINE X 2)     Status: None   Collection Time    10/29/13  7:58 AM      Result Value Ref Range Status   Specimen Description Blood   Final   Special Requests Normal   Final   Culture NO  GROWTH 1 DAY   Final   Report Status PENDING   Incomplete  CULTURE, BLOOD (ROUTINE X 2)     Status: None   Collection Time    10/29/13  8:06 AM      Result Value Ref Range Status   Specimen Description Blood   Final   Special Requests Normal   Final   Culture NO GROWTH 1 DAY   Final   Report Status PENDING   Incomplete   Studies/Results: Dg Chest Port 1 View  10/29/2013   CLINICAL DATA:  Fever.  EXAM: PORTABLE CHEST - 1 VIEW  COMPARISON:  10/24/2013  FINDINGS: Cardiac silhouette is upper limits of normal in size. Calcification and tortuosity originated of the thoracic aorta. The lungs are less well inflated than on the prior study with mild prominence of the interstitial markings and central pulmonary vasculature. No confluent airspace opacification, pleural effusion, or pneumothorax is identified. No acute osseous abnormality is identified.  IMPRESSION: Mild accentuation of the pulmonary markings bilaterally, which may be secondary to shallower lung inflation, although mild interstitial pneumonitis is not excluded. No confluent airspace opacity.   Electronically Signed   By: Logan Bores   On: 10/29/2013 10:20    Medications: I have reviewed the patient's current medications.  Assesment:  Active Problems:   Dementia with behavioral disturbance   UTI (lower urinary tract infection)   AKI (acute kidney injury) hypernatremia elevated lipase Abdominal pain H/O cholelithiasis Anemia Elevated lipase hypokalemia R/O HCAP  Plan: Medications reviewed Continue cefepime and vancomycin Will continue supportive care    LOS: 6 days   Zamia Tyminski 10/30/2013, 8:34 AM

## 2013-10-30 NOTE — Progress Notes (Signed)
ANTIBIOTIC CONSULT NOTE  Pharmacy Consult for Vancomycin Indication: rule out pneumonia, fever  No Known Allergies  Patient Measurements: Height: 5' 2.99" (160 cm) Weight: 147 lb 7.8 oz (66.9 kg) IBW/kg (Calculated) : 52.38  Vital Signs: Temp: 97.4 F (36.3 C) (06/06 0550) Temp src: Axillary (06/06 0550) BP: 107/44 mmHg (06/06 0550) Pulse Rate: 69 (06/06 0550) Intake/Output from previous day: 06/05 0701 - 06/06 0700 In: 718.3 [P.O.:90; I.V.:328.3; IV Piggyback:300] Out: -  Intake/Output from this shift: Total I/O In: 360 [P.O.:360] Out: -   Labs:  Recent Labs  10/28/13 0528 10/30/13 0505  WBC 11.7*  --   HGB 8.7*  --   PLT 289  --   CREATININE 1.38* 1.76*   Estimated Creatinine Clearance: 22.6 ml/min (by C-G formula based on Cr of 1.76). No results found for this basename: Letta Median, VANCORANDOM, GENTTROUGH, GENTPEAK, GENTRANDOM, TOBRATROUGH, TOBRAPEAK, TOBRARND, AMIKACINPEAK, AMIKACINTROU, AMIKACIN,  in the last 72 hours   Microbiology: Recent Results (from the past 720 hour(s))  URINE CULTURE     Status: None   Collection Time    10/24/13 10:35 PM      Result Value Ref Range Status   Specimen Description URINE, CATHETERIZED   Final   Special Requests NONE   Final   Culture  Setup Time     Final   Value: 10/25/2013 14:25     Performed at [place]     Final   Value: 15,000 COLONIES/ML     Performed at [facility]   Culture     Final   Value: STAPHYLOCOCCUS SPECIES (COAGULASE NEGATIVE)     Note: RIFAMPIN AND GENTAMICIN SHOULD NOT BE USED AS SINGLE DRUGS FOR TREATMENT OF STAPH INFECTIONS.     Performed at [facility]   Report Status 10/28/2013 FINAL   Final   Organism ID, Bacteria STAPHYLOCOCCUS SPECIES (COAGULASE NEGATIVE)   Final  CULTURE, BLOOD (ROUTINE X 2)     Status: None   Collection Time    10/29/13  7:58 AM      Result Value Ref Range Status   Specimen Description Blood   Final   Special  Requests Normal   Final   Culture NO GROWTH 1 DAY   Final   Report Status PENDING   Incomplete  CULTURE, BLOOD (ROUTINE X 2)     Status: None   Collection Time    10/29/13  8:06 AM      Result Value Ref Range Status   Specimen Description Blood   Final   Special Requests Normal   Final   Culture NO GROWTH 1 DAY   Final   Report Status PENDING   Incomplete   Medical History: Past Medical History  Diagnosis Date  . Chronic kidney disease     acute renal failure  . COPD (chronic obstructive pulmonary disease)   . Diabetes mellitus   . Depression   . DEMENTIA   . Hypertension   . Shortness of breath   . GERD (gastroesophageal reflux disease)   . Gallstones   . Diverticulosis   . Concern about mental disorder without diagnosis   . Dehydration   . Anxiety   . Dysphagia   . Anemia   . Cholelithiases   . Renal stones   . Hyperlipidemia   . Hypokalemia   . Dementia    Rocephin 6/1 >> 6/5 Cefepime 6/5 >> Vancomycin 6/5 >>  Assessment: 80yo female who was on Rocephin  for UTI.  Pt spiked temp last pm and abx changed to Cefepime (per MD) and Vancomycin. Worsening renal function with Estimated Creatinine Clearance: 22.6 ml/min (by C-G formula based on Cr of 1.76). Urine culture with only 15K colonies CONS (SENS to Vanc)  Goal of Therapy:  Vancomycin trough level 15-20 mcg/ml Eradicate infection.  Plan:  Continue Cefepime 1gm IV q12hrs per MD  Adjust Vancomycin slightly to 750mg  IV every 24 hours. Check trough at steady state Monitor labs, renal fxn, and cultures  Pricilla Larsson 10/30/2013,10:11 AM

## 2013-10-30 NOTE — Progress Notes (Signed)
10/30/13 1612 Patient had small green colored stool this afternoon, incontinent. Unable to obtain sample for hemoccult. Nursing to monitor. Donavan Foil, RN

## 2013-10-31 LAB — BASIC METABOLIC PANEL
BUN: 16 mg/dL (ref 6–23)
CO2: 21 mEq/L (ref 19–32)
CREATININE: 1.41 mg/dL — AB (ref 0.50–1.10)
Calcium: 8.8 mg/dL (ref 8.4–10.5)
Chloride: 106 mEq/L (ref 96–112)
GFR calc non Af Amer: 34 mL/min — ABNORMAL LOW (ref >90)
GFR, EST AFRICAN AMERICAN: 39 mL/min — AB (ref >90)
GLUCOSE: 100 mg/dL — AB (ref 70–99)
Potassium: 3.3 mEq/L — ABNORMAL LOW (ref 3.7–5.3)
Sodium: 140 mEq/L (ref 137–147)

## 2013-10-31 NOTE — Progress Notes (Signed)
Patient yelling.  Trying to pull gown off.  Pulled IV out of arm.  Daughter at bedside.  Sitter at bedside.

## 2013-10-31 NOTE — Progress Notes (Signed)
Subjective: Patient is resting.  She is less agitated but remained confused and disoriented. No fever or chills. Objective: Vital signs in last 24 hours: Temp:  [98.3 F (36.8 C)-99.3 F (37.4 C)] 98.3 F (36.8 C) (06/07 0640) Pulse Rate:  [51-75] 54 (06/07 0640) Resp:  [16] 16 (06/07 0640) BP: (101-136)/(48-66) 136/60 mmHg (06/07 0640) SpO2:  [88 %-97 %] 94 % (06/07 0640) Weight change:  Last BM Date: 10/30/13  Intake/Output from previous day: 06/06 0701 - 06/07 0700 In: 693.3 [P.O.:360; I.V.:283.3; IV Piggyback:50] Out: -   PHYSICAL EXAM General appearance: alert and slowed mentation Resp: clear to auscultation bilaterally Cardio: S1, S2 normal GI: abdomen is soft and lax, tenderness of the lower quadrant. Bowel sound is positive Extremities: extremities normal, atraumatic, no cyanosis or edema  Lab Results:    @labtest @ [facility] No results found for this basename: PHART, PCO2, PO2ART, TCO2, HCO3,  in the last 72 hours CULTURES Recent Results (from the past 240 hour(s))  URINE CULTURE     Status: None   Collection Time    10/24/13 10:35 PM      Result Value Ref Range Status   Specimen Description URINE, CATHETERIZED   Final   Special Requests NONE   Final   Culture  Setup Time     Final   Value: 10/25/2013 14:25     Performed at [place]     Final   Value: 15,000 COLONIES/ML     Performed at [facility]   Culture     Final   Value: STAPHYLOCOCCUS SPECIES (COAGULASE NEGATIVE)     Note: RIFAMPIN AND GENTAMICIN SHOULD NOT BE USED AS SINGLE DRUGS FOR TREATMENT OF STAPH INFECTIONS.     Performed at [facility]   Report Status 10/28/2013 FINAL   Final   Organism ID, Bacteria STAPHYLOCOCCUS SPECIES (COAGULASE NEGATIVE)   Final  CULTURE, BLOOD (ROUTINE X 2)     Status: None   Collection Time    10/29/13  7:58 AM      Result Value Ref Range Status   Specimen Description Blood   Final   Special Requests Normal   Final   Culture NO GROWTH 1 DAY   Final   Report Status PENDING   Incomplete  CULTURE, BLOOD (ROUTINE X 2)     Status: None   Collection Time    10/29/13  8:06 AM      Result Value Ref Range Status   Specimen Description Blood   Final   Special Requests Normal   Final   Culture NO GROWTH 1 DAY   Final   Report Status PENDING   Incomplete   Studies/Results: Dg Chest Port 1 View  10/29/2013   CLINICAL DATA:  Fever.  EXAM: PORTABLE CHEST - 1 VIEW  COMPARISON:  10/24/2013  FINDINGS: Cardiac silhouette is upper limits of normal in size. Calcification and tortuosity originated of the thoracic aorta. The lungs are less well inflated than on the prior study with mild prominence of the interstitial markings and central pulmonary vasculature. No confluent airspace opacification, pleural effusion, or pneumothorax is identified. No acute osseous abnormality is identified.  IMPRESSION: Mild accentuation of the pulmonary markings bilaterally, which may be secondary to shallower lung inflation, although mild interstitial pneumonitis is not excluded. No confluent airspace opacity.   Electronically Signed   By: Logan Bores   On: 10/29/2013 10:20    Medications: I have reviewed the patient's current medications.  Assesment: Active Problems:   Dementia with behavioral disturbance   UTI (lower urinary tract infection)   AKI (acute kidney injury) hypernatremia elevated lipase Abdominal pain H/O cholelithiasis Anemia Elevated lipase hypokalemia R/O HCAP  Plan: Medications reviewed Continue cefepime and vancomycin Will continue supportive care    LOS: 7 days   Allysson Rinehimer 10/31/2013, 8:18 AM

## 2013-10-31 NOTE — Progress Notes (Signed)
Dr. Everette Rank notified of patient pulling IV out.  Stated to leave IV out for anticipated discharge on Monday.

## 2013-10-31 NOTE — Progress Notes (Addendum)
Attempted to give meds. Patient combative and hitting staff. Not time for IM Haldol. Spit meds out. Kicking at staff while trying to change her sheets. Yelling out. Sitter at bedside.

## 2013-11-01 LAB — BASIC METABOLIC PANEL
BUN: 13 mg/dL (ref 6–23)
CALCIUM: 9 mg/dL (ref 8.4–10.5)
CO2: 23 mEq/L (ref 19–32)
Chloride: 106 mEq/L (ref 96–112)
Creatinine, Ser: 1.3 mg/dL — ABNORMAL HIGH (ref 0.50–1.10)
GFR calc non Af Amer: 37 mL/min — ABNORMAL LOW (ref >90)
GFR, EST AFRICAN AMERICAN: 43 mL/min — AB (ref >90)
Glucose, Bld: 96 mg/dL (ref 70–99)
Potassium: 3.7 mEq/L (ref 3.7–5.3)
Sodium: 143 mEq/L (ref 137–147)

## 2013-11-01 MED ORDER — LEVOFLOXACIN 500 MG PO TABS: 500.0000 mg | ORAL_TABLET | Freq: Every day | ORAL | Status: DC

## 2013-11-01 MED ORDER — HYDROCODONE-ACETAMINOPHEN 5-325 MG PO TABS
1.0000 | ORAL_TABLET | Freq: Four times a day (QID) | ORAL | Status: AC | PRN
Start: 2013-11-01 — End: ?
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
## 2013-11-01 NOTE — Discharge Summary (Signed)
Physician Discharge Summary  Patient ID: [name] [name] MRN: [id number] DOB/AGE: [birth year] 78 y.o. Primary Care Physician:Leigh Blas, MD Admit date: 10/24/2013 Discharge date: 11/01/2013    Discharge Diagnoses:   Active Problems:   Dementia with behavioral disturbance   UTI (lower urinary tract infection)   AKI (acute kidney injury) Health Care associated pneumonia   Medication List         acetaminophen 500 MG tablet  Commonly known as:  TYLENOL  Take 500 mg by mouth 2 (two) times daily.     ALPRAZolam 0.5 MG tablet  Commonly known as:  XANAX  Take 0.5 mg by mouth every 6 (six) hours as needed for anxiety.     amLODipine 5 MG tablet  Commonly known as:  NORVASC  Take 5 mg by mouth every morning.     atorvastatin 10 MG tablet  Commonly known as:  LIPITOR  Take 10 mg by mouth at bedtime.     CALCARB 600/D 600-400 MG-UNIT per tablet  Generic drug:  Calcium Carbonate-Vitamin D  Take 1 tablet by mouth 2 (two) times daily.     escitalopram 5 MG tablet  Commonly known as:  LEXAPRO  Take 15 mg by mouth daily.     feeding supplement (RESOURCE BREEZE) Liqd  Take 1 Container by mouth 3 (three) times daily between meals.     ferrous sulfate 325 (65 FE) MG tablet  Take 325 mg by mouth every morning.     fluticasone 50 MCG/ACT nasal spray  Commonly known as:  FLONASE  Place 2 sprays into the nose daily. allergies     haloperidol lactate 5 MG/ML injection  Commonly known as:  HALDOL  Inject 1.5 mg into the muscle every 8 (eight) hours as needed (for agitation).     HYDROcodone-acetaminophen 5-325 MG per tablet  Commonly known as:  NORCO/VICODIN  Take 1 tablet by mouth every 6 (six) hours as needed for moderate pain. pain     ondansetron 4 MG tablet  Commonly known as:  ZOFRAN  Take 4 mg by mouth every 8 (eight) hours as needed for nausea or vomiting.     pantoprazole 40 MG tablet  Commonly known as:  PROTONIX  Take 1 tablet (40 mg total) by mouth daily before  breakfast.     QUEtiapine 200 MG tablet  Commonly known as:  SEROQUEL  Take 200 mg by mouth.     sennosides-docusate sodium 8.6-50 MG tablet  Commonly known as:  SENOKOT-S  Take 2 tablets by mouth 2 (two) times daily.     vitamin B-12 1000 MCG tablet  Commonly known as:  CYANOCOBALAMIN  Take 1,000 mcg by mouth every morning.        Discharged Condition: improved    Consults: GI  Significant Diagnostic Studies: Ct Abdomen Wo Contrast  2013/10/29   CLINICAL DATA:  Acute renal injury and recent urinary tract infection, abdominal pain, elevated lipase  EXAM: CT ABDOMEN WITHOUT CONTRAST  TECHNIQUE: Multidetector CT imaging of the abdomen was performed following the standard protocol without IV contrast.  COMPARISON:  CT abdomen pelvis - 09/17/2013  FINDINGS: The lack of intravenous contrast limits the ability to evaluate solid abdominal organs. The examination is further degraded secondary to patient motion artifact necessitating the acquisition of additional images.  There is an ill-defined punctate (approximately 6 mm) stone within the inferior pole the right kidney (coronal image 51, series 9). Calcifications about the left spine hilum are favored to be vascular in etiology. No definite  left-sided nephrolithiasis. Note is again made of a small right-sided extrarenal pelvis with mild ureterectasis of the imaged superior mid aspect of the right ureter, unchanged. No discrete stones are seen within the imaged course of either ureter. There is grossly unchanged bilateral grossly symmetric minimally age-related perinephric stranding.  Normal hepatic contour. There is an approximately 1.5 x 0.9 cm laminated gallstone within an otherwise normal-appearing gallbladder (image 42, series 9). No definite gallbladder wall thickening or pericholecystic fluid on this degraded noncontrast examination. No ascites.  Normal noncontrast appearance of the bilateral adrenal glands and spleen. The pancreas remains  atrophic but without definitive peripancreatic stranding.  Scattered atherosclerotic plaque within a normal caliber abdominal aorta. Post IVC filter placement. No definite bulky retroperitoneal or mesenteric adenopathy within the imaged abdomen.  Ingested enteric contrast extends to the level of the distal small bowel. No definite evidence of enteric obstruction. No pneumoperitoneum, pneumatosis or portal venous gas.  Limited visualization of the lower thorax demonstrates consolidative airspace opacities with air bronchograms within the imaged medial basilar segment of the right lower lobe. There is minimal subsegmental atelectasis within the left lower lobe. No pleural effusion.  Borderline cardiomegaly. Suspected calcifications within the aortic valve leaflets. No pericardial effusion.  No acute or aggressive osseus abnormalities. Six non rib-bearing lumbar type vertebral bodies were noted on prior abdominal CT. Mild to moderate multilevel lumbar spine DDD, worse at L3-L4 and L6-S1 with disk space height loss, endplate irregularity and sclerosis there is minimal (approximately 7 mm) anterolisthesis of L5 upon L6, grossly unchanged.  There is mild diffuse body wall edema. Regional soft tissues appear otherwise normal.  IMPRESSION: 1. No definite acute findings on this motion degraded noncontrast examination. 2. Airspace opacities within the imaged right lower lobe worrisome for pneumonia. 3. Grossly unchanged punctate (approximately 6 mm) nonobstructing stone within this inferior pole of the right kidney. No evidence of left-sided nephrolithiasis. 4. Cholelithiasis without definite evidence of cholecystitis on this degraded noncontrast examination. Further evaluation with abdominal ultrasound could be performed as clinically indicated. 5. Borderline cardiomegaly with suspect calcifications within the aortic valve leaflets. Further evaluation cardiac echo could be performed as clinically indicated   Electronically  Signed   By: Sandi Mariscal M.D.   On: 10/25/2013 12:48   Dg Chest 1 View  10/24/2013   CLINICAL DATA:  Fever, cough and congestion  EXAM: CHEST - 1 VIEW  COMPARISON:  09/17/2013  FINDINGS: Cardiac silhouette is normal in size. The aorta is uncoiled. No mediastinal or hilar masses. No evidence of adenopathy.  Lungs are clear.  No pleural effusion.  No pneumothorax.  Bony thorax is demineralized but intact.  IMPRESSION: No acute cardiopulmonary disease.   Electronically Signed   By: Lajean Manes M.D.   On: 10/24/2013 20:25   Dg Abd 1 View  10/21/2013   CLINICAL DATA:  80 year old female with renal calculi.  EXAM: ABDOMEN - 1 VIEW  COMPARISON:  09/17/2013 CT.  12/30/2012 and prior radiographs  FINDINGS: The 9 mm right lower pole calculus identified on recent CT is difficult to visualize on this study, likely secondary to overlying bowel gas and stool, unless intervention has been performed.  No other suspicious calcifications are identified, specifically along the courses of the ureters.  The bowel gas pattern is unremarkable.  An IVC filter and degenerative changes within the lumbar spine or again noted.  IMPRESSION: 9 mm right lower pole calculus identified on recent CT is difficult to visualize on this study.  No other suspicious calcifications  identified.  Unremarkable bowel gas pattern   Electronically Signed   By: Hassan Rowan M.D.   On: 10/21/2013 09:13   Ct Head Wo Contrast  10/24/2013   CLINICAL DATA:  Dementia and altered mental status.  Cough.  EXAM: CT HEAD WITHOUT CONTRAST  TECHNIQUE: Contiguous axial images were obtained from the base of the skull through the vertex without intravenous contrast.  COMPARISON:  05/01/2013  FINDINGS: Skull and Sinuses:Chronic anterior nasal septal defect. Debris or hemorrhage noted at the defect currently. There is inflammatory mucosal thickening scattered about the bilateral paranasal sinuses. No sinus effusion or osseous erosion.  Orbits: Left cataract resection.   Brain: No evidence of acute abnormality, such as acute infarction, hemorrhage, hydrocephalus, or mass lesion/mass effect. Stable pattern of chronic small vessel disease with ischemic gliosis confluent around the lateral ventricles. There is generalized cerebral volume loss with ex vacuo ventricular enlargement, pattern and severity stable from previous.  IMPRESSION: 1. No acute intracranial findings. 2. Chronic small vessel disease and brain atrophy. 3. Bilateral sinusitis.  Chronic anterior nasal septal perforation.   Electronically Signed   By: Jorje Guild M.D.   On: 10/24/2013 23:40   US Renal  10/21/2013   CLINICAL DATA:  Nephrolithiasis and possible hydronephrosis.  EXAM: RENAL/URINARY TRACT ULTRASOUND COMPLETE  COMPARISON:  09/17/2013  FINDINGS: Right Kidney:  Length: 8.3 cm in length. No hydronephrosis. Normal cortical echogenicity. Moderate cortical thinning. 9 mm hyperechoic focus within the medullary portion of the lower pole is nonspecific. No shadowing.  Left Kidney:  Length: 8.8 cm in length. Echogenicity within normal limits. No mass or hydronephrosis visualized. Moderate cortical thinning.  Bladder:  Decompressed.  IMPRESSION: 9 mm hyperechoic focus in the lower pole of the right kidney without shadowing likely corresponds to the patient's known calculus.  No hydronephrosis.  Chronic changes.   Electronically Signed   By: Maryclare Bean M.D.   On: 10/21/2013 09:39   Dg Chest Port 1 View  10/29/2013   CLINICAL DATA:  Fever.  EXAM: PORTABLE CHEST - 1 VIEW  COMPARISON:  10/24/2013  FINDINGS: Cardiac silhouette is upper limits of normal in size. Calcification and tortuosity originated of the thoracic aorta. The lungs are less well inflated than on the prior study with mild prominence of the interstitial markings and central pulmonary vasculature. No confluent airspace opacification, pleural effusion, or pneumothorax is identified. No acute osseous abnormality is identified.  IMPRESSION: Mild  accentuation of the pulmonary markings bilaterally, which may be secondary to shallower lung inflation, although mild interstitial pneumonitis is not excluded. No confluent airspace opacity.   Electronically Signed   By: Logan Bores   On: 10/29/2013 10:20    Lab Results: Basic Metabolic Panel:  Recent Labs  10/31/13 0542 11/01/13 0652  NA 140 143  K 3.3* 3.7  CL 106 106  CO2 21 23  GLUCOSE 100* 96  BUN 16 13  CREATININE 1.41* 1.30*  CALCIUM 8.8 9.0   Liver Function Tests: No results found for this basename: AST, ALT, ALKPHOS, BILITOT, PROT, ALBUMIN,  in the last 72 hours   CBC: No results found for this basename: WBC, NEUTROABS, HGB, HCT, MCV, PLT,  in the last 72 hours  Recent Results (from the past 240 hour(s))  URINE CULTURE     Status: None   Collection Time    10/24/13 10:35 PM      Result Value Ref Range Status   Specimen Description URINE, CATHETERIZED   Final   Special Requests NONE  Final   Culture  Setup Time     Final   Value: 10/25/2013 14:25     Performed at [facility]     Final   Value: 15,000 COLONIES/ML     Performed at [facility]   Culture     Final   Value: STAPHYLOCOCCUS SPECIES (COAGULASE NEGATIVE)     Note: RIFAMPIN AND GENTAMICIN SHOULD NOT BE USED AS SINGLE DRUGS FOR TREATMENT OF STAPH INFECTIONS.     Performed at [facility]   Report Status 10/28/2013 FINAL   Final   Organism ID, Bacteria STAPHYLOCOCCUS SPECIES (COAGULASE NEGATIVE)   Final  CULTURE, BLOOD (ROUTINE X 2)     Status: None   Collection Time    10/29/13  7:58 AM      Result Value Ref Range Status   Specimen Description Blood   Final   Special Requests Normal   Final   Culture NO GROWTH 2 DAYS   Final   Report Status PENDING   Incomplete  CULTURE, BLOOD (ROUTINE X 2)     Status: None   Collection Time    10/29/13  8:06 AM      Result Value Ref Range Status   Specimen Description Blood   Final   Special Requests Normal   Final    Culture NO GROWTH 2 DAYS   Final   Report Status PENDING   Incomplete     Hospital Course:   This is an 80 years old female patient with history of multiple medical illnesses who was admitted due to change in mental status. Patient was found UTI and elevated lipase. CT scan of the abdomen showed cholelithesis with out cholecystitis or pancreatitis. Patient also had acute renal injury. She was hydrated and was started on IV rocephin. Her renal function improved. However, patient continue to spike. She was started on combination of  cefepime and vancomycin. Patient improved and back to her base line. She will be discharged to nursing home on oral antibiotics.  Discharge Exam: Blood pressure 145/78, pulse 59, temperature 98.5 F (36.9 C), temperature source Oral, resp. rate 20, height 5' 2.99" (1.6 m), weight 66.9 kg (147 lb 7.8 oz), SpO2 95.00%.   Disposition: nursing home      Signed: Rosita Fire   11/01/2013, 8:13 AM

## 2013-11-01 NOTE — Progress Notes (Signed)
Pt is to be discharged to [facility] today. Pt is in NAD, IV is out, all paperwork has been reviewed/discussed with patient, and there are no questions/concerns at this time. Assessment is unchanged from this morning. Pt is to be accompanied downstairs by staff and family via EMS.

## 2013-11-03 LAB — CULTURE, BLOOD (ROUTINE X 2)
Culture: NO GROWTH
Culture: NO GROWTH

## 2013-11-29 ENCOUNTER — Ambulatory Visit (HOSPITAL_COMMUNITY)
Admission: RE | Admit: 2013-11-29 | Discharge: 2013-11-29 | Disposition: A | Discharge status: Home / Self Care | Payer: PRIVATE HEALTH INSURANCE | Source: Ambulatory Visit | Attending: Urology | Admitting: Urology

## 2013-11-29 ENCOUNTER — Other Ambulatory Visit: Payer: Self-pay | Admitting: Urology

## 2013-11-29 DIAGNOSIS — N200 Calculus of kidney: Secondary | ICD-10-CM | POA: Insufficient documentation

## 2013-11-29 DIAGNOSIS — N269 Renal sclerosis, unspecified: Secondary | ICD-10-CM | POA: Diagnosis not present

## 2013-11-29 DIAGNOSIS — M5137 Other intervertebral disc degeneration, lumbosacral region: Secondary | ICD-10-CM | POA: Insufficient documentation

## 2013-11-29 DIAGNOSIS — M51379 Other intervertebral disc degeneration, lumbosacral region without mention of lumbar back pain or lower extremity pain: Secondary | ICD-10-CM | POA: Insufficient documentation

## 2013-12-07 ENCOUNTER — Ambulatory Visit (INDEPENDENT_AMBULATORY_CARE_PROVIDER_SITE_OTHER): Payer: PRIVATE HEALTH INSURANCE | Admitting: Urology

## 2013-12-07 DIAGNOSIS — R82998 Other abnormal findings in urine: Secondary | ICD-10-CM

## 2013-12-07 DIAGNOSIS — N200 Calculus of kidney: Secondary | ICD-10-CM

## 2014-07-21 ENCOUNTER — Emergency Department (HOSPITAL_COMMUNITY): Payer: Medicare Other

## 2014-07-21 ENCOUNTER — Inpatient Hospital Stay (HOSPITAL_COMMUNITY)
Admission: EM | Admit: 2014-07-21 | Discharge: 2014-07-25 | DRG: 178 | Disposition: A | Discharge status: Skilled Nursing Facility | Payer: Medicare Other | Attending: Internal Medicine | Admitting: Internal Medicine

## 2014-07-21 ENCOUNTER — Encounter (HOSPITAL_COMMUNITY): Payer: Self-pay | Admitting: *Deleted

## 2014-07-21 ENCOUNTER — Other Ambulatory Visit (HOSPITAL_COMMUNITY): Payer: Self-pay

## 2014-07-21 DIAGNOSIS — J690 Pneumonitis due to inhalation of food and vomit: Principal | ICD-10-CM

## 2014-07-21 DIAGNOSIS — Z87891 Personal history of nicotine dependence: Secondary | ICD-10-CM

## 2014-07-21 DIAGNOSIS — N183 Chronic kidney disease, stage 3 (moderate): Secondary | ICD-10-CM | POA: Diagnosis present

## 2014-07-21 DIAGNOSIS — Z66 Do not resuscitate: Secondary | ICD-10-CM | POA: Diagnosis present

## 2014-07-21 DIAGNOSIS — S0990XA Unspecified injury of head, initial encounter: Secondary | ICD-10-CM | POA: Diagnosis present

## 2014-07-21 DIAGNOSIS — W208XXA Other cause of strike by thrown, projected or falling object, initial encounter: Secondary | ICD-10-CM | POA: Diagnosis present

## 2014-07-21 DIAGNOSIS — J449 Chronic obstructive pulmonary disease, unspecified: Secondary | ICD-10-CM | POA: Diagnosis present

## 2014-07-21 DIAGNOSIS — N179 Acute kidney failure, unspecified: Secondary | ICD-10-CM | POA: Diagnosis present

## 2014-07-21 DIAGNOSIS — K219 Gastro-esophageal reflux disease without esophagitis: Secondary | ICD-10-CM | POA: Diagnosis present

## 2014-07-21 DIAGNOSIS — R1110 Vomiting, unspecified: Secondary | ICD-10-CM | POA: Diagnosis not present

## 2014-07-21 DIAGNOSIS — I4891 Unspecified atrial fibrillation: Secondary | ICD-10-CM

## 2014-07-21 DIAGNOSIS — E119 Type 2 diabetes mellitus without complications: Secondary | ICD-10-CM | POA: Diagnosis present

## 2014-07-21 DIAGNOSIS — D649 Anemia, unspecified: Secondary | ICD-10-CM | POA: Diagnosis present

## 2014-07-21 DIAGNOSIS — E785 Hyperlipidemia, unspecified: Secondary | ICD-10-CM | POA: Diagnosis present

## 2014-07-21 DIAGNOSIS — I129 Hypertensive chronic kidney disease with stage 1 through stage 4 chronic kidney disease, or unspecified chronic kidney disease: Secondary | ICD-10-CM | POA: Diagnosis present

## 2014-07-21 DIAGNOSIS — N189 Chronic kidney disease, unspecified: Secondary | ICD-10-CM

## 2014-07-21 DIAGNOSIS — F0391 Unspecified dementia with behavioral disturbance: Secondary | ICD-10-CM | POA: Diagnosis present

## 2014-07-21 LAB — CBC WITH DIFFERENTIAL/PLATELET
Basophils Absolute: 0 10*3/uL (ref 0.0–0.1)
Basophils Relative: 0 % (ref 0–1)
Eosinophils Absolute: 0.2 10*3/uL (ref 0.0–0.7)
Eosinophils Relative: 2 % (ref 0–5)
HCT: 38.8 % (ref 36.0–46.0)
Hemoglobin: 12.5 g/dL (ref 12.0–15.0)
Lymphocytes Relative: 13 % (ref 12–46)
Lymphs Abs: 1.6 10*3/uL (ref 0.7–4.0)
MCH: 29.6 pg (ref 26.0–34.0)
MCHC: 32.2 g/dL (ref 30.0–36.0)
MCV: 91.9 fL (ref 78.0–100.0)
Monocytes Absolute: 0.8 10*3/uL (ref 0.1–1.0)
Monocytes Relative: 6 % (ref 3–12)
Neutro Abs: 10.1 10*3/uL — ABNORMAL HIGH (ref 1.7–7.7)
Neutrophils Relative %: 79 % — ABNORMAL HIGH (ref 43–77)
Platelets: 219 10*3/uL (ref 150–400)
RBC: 4.22 MIL/uL (ref 3.87–5.11)
RDW: 14.1 % (ref 11.5–15.5)
WBC: 12.7 10*3/uL — ABNORMAL HIGH (ref 4.0–10.5)

## 2014-07-21 MED ORDER — FUROSEMIDE 10 MG/ML IJ SOLN
40.0000 mg | Freq: Once | INTRAMUSCULAR | Status: AC
  Administered 2014-07-21: 40 mg via INTRAVENOUS
  Filled 2014-07-21: qty 4

## 2014-07-21 NOTE — ED Provider Notes (Addendum)
Medical screening examination/treatment/procedure(s) were conducted as a shared visit with non-physician practitioner(s) and myself.  I personally evaluated the patient during the encounter.   EKG Interpretation None     EKG:  Rhythm: atrial fibrillation Rate: 74 QRS: 84ms ST segments: NS ST changes  85yF sent from [facility] for evaluation of cough. Pt unfortunately has advanced dementia and unable to provide any useful history. Apparently vomited earlier this evening then noted to be coughing and had noisy breathing. Aspiration certainly consideration. Consider HF. Gurgling respirations and CXR more consistent with this, although last ECHO 10/2013 with normal systolic function, EF 16-10%, no wall motion abnormalities, normal LV diastolic function.  EKG today shows what appears to be new onset afib. Rate fine. Normotensive. Will place IV. Lasix. Basic labs, troponin, TSH, mag.       Virgel Manifold, MD 07/22/14 [phone]
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
## 2014-07-22 ENCOUNTER — Other Ambulatory Visit: Payer: Self-pay

## 2014-07-22 DIAGNOSIS — F0391 Unspecified dementia with behavioral disturbance: Secondary | ICD-10-CM | POA: Diagnosis present

## 2014-07-22 DIAGNOSIS — W208XXA Other cause of strike by thrown, projected or falling object, initial encounter: Secondary | ICD-10-CM | POA: Diagnosis present

## 2014-07-22 DIAGNOSIS — I4891 Unspecified atrial fibrillation: Secondary | ICD-10-CM | POA: Diagnosis present

## 2014-07-22 DIAGNOSIS — N179 Acute kidney failure, unspecified: Secondary | ICD-10-CM

## 2014-07-22 DIAGNOSIS — R1110 Vomiting, unspecified: Secondary | ICD-10-CM | POA: Diagnosis present

## 2014-07-22 DIAGNOSIS — E119 Type 2 diabetes mellitus without complications: Secondary | ICD-10-CM | POA: Diagnosis present

## 2014-07-22 DIAGNOSIS — J690 Pneumonitis due to inhalation of food and vomit: Secondary | ICD-10-CM

## 2014-07-22 DIAGNOSIS — N183 Chronic kidney disease, stage 3 (moderate): Secondary | ICD-10-CM | POA: Diagnosis present

## 2014-07-22 DIAGNOSIS — E785 Hyperlipidemia, unspecified: Secondary | ICD-10-CM | POA: Diagnosis present

## 2014-07-22 DIAGNOSIS — D649 Anemia, unspecified: Secondary | ICD-10-CM | POA: Diagnosis present

## 2014-07-22 DIAGNOSIS — Z66 Do not resuscitate: Secondary | ICD-10-CM | POA: Diagnosis present

## 2014-07-22 DIAGNOSIS — S0990XA Unspecified injury of head, initial encounter: Secondary | ICD-10-CM | POA: Diagnosis present

## 2014-07-22 DIAGNOSIS — N189 Chronic kidney disease, unspecified: Secondary | ICD-10-CM

## 2014-07-22 DIAGNOSIS — I129 Hypertensive chronic kidney disease with stage 1 through stage 4 chronic kidney disease, or unspecified chronic kidney disease: Secondary | ICD-10-CM | POA: Diagnosis present

## 2014-07-22 DIAGNOSIS — Z87891 Personal history of nicotine dependence: Secondary | ICD-10-CM | POA: Diagnosis not present

## 2014-07-22 DIAGNOSIS — J449 Chronic obstructive pulmonary disease, unspecified: Secondary | ICD-10-CM | POA: Diagnosis present

## 2014-07-22 DIAGNOSIS — K219 Gastro-esophageal reflux disease without esophagitis: Secondary | ICD-10-CM | POA: Diagnosis present

## 2014-07-22 LAB — CBC WITH DIFFERENTIAL/PLATELET
BASOS ABS: 0 10*3/uL (ref 0.0–0.1)
BASOS PCT: 0 % (ref 0–1)
EOS ABS: 0 10*3/uL (ref 0.0–0.7)
EOS PCT: 0 % (ref 0–5)
HEMATOCRIT: 35.8 % — AB (ref 36.0–46.0)
Hemoglobin: 11.6 g/dL — ABNORMAL LOW (ref 12.0–15.0)
Lymphocytes Relative: 7 % — ABNORMAL LOW (ref 12–46)
Lymphs Abs: 0.9 10*3/uL (ref 0.7–4.0)
MCH: 29.3 pg (ref 26.0–34.0)
MCHC: 32.4 g/dL (ref 30.0–36.0)
MCV: 90.4 fL (ref 78.0–100.0)
MONO ABS: 0.7 10*3/uL (ref 0.1–1.0)
Monocytes Relative: 6 % (ref 3–12)
Neutro Abs: 11.2 10*3/uL — ABNORMAL HIGH (ref 1.7–7.7)
Neutrophils Relative %: 87 % — ABNORMAL HIGH (ref 43–77)
PLATELETS: 213 10*3/uL (ref 150–400)
RBC: 3.96 MIL/uL (ref 3.87–5.11)
RDW: 13.9 % (ref 11.5–15.5)
WBC: 12.8 10*3/uL — AB (ref 4.0–10.5)

## 2014-07-22 LAB — URINE MICROSCOPIC-ADD ON

## 2014-07-22 LAB — BRAIN NATRIURETIC PEPTIDE: B Natriuretic Peptide: 55 pg/mL (ref 0.0–100.0)

## 2014-07-22 LAB — URINALYSIS, ROUTINE W REFLEX MICROSCOPIC
Bilirubin Urine: NEGATIVE
Glucose, UA: NEGATIVE mg/dL
Ketones, ur: NEGATIVE mg/dL
Nitrite: NEGATIVE
Protein, ur: NEGATIVE mg/dL
Specific Gravity, Urine: 1.015 (ref 1.005–1.030)
Urobilinogen, UA: 0.2 mg/dL (ref 0.0–1.0)
pH: 5.5 (ref 5.0–8.0)

## 2014-07-22 LAB — BASIC METABOLIC PANEL
Anion gap: 8 (ref 5–15)
BUN: 50 mg/dL — ABNORMAL HIGH (ref 6–23)
CO2: 28 mmol/L (ref 19–32)
Calcium: 9.9 mg/dL (ref 8.4–10.5)
Chloride: 104 mmol/L (ref 96–112)
Creatinine, Ser: 2.58 mg/dL — ABNORMAL HIGH (ref 0.50–1.10)
GFR calc Af Amer: 19 mL/min — ABNORMAL LOW (ref >90)
GFR calc non Af Amer: 16 mL/min — ABNORMAL LOW (ref >90)
Glucose, Bld: 177 mg/dL — ABNORMAL HIGH (ref 70–99)
Potassium: 4.7 mmol/L (ref 3.5–5.1)
Sodium: 140 mmol/L (ref 135–145)

## 2014-07-22 LAB — COMPREHENSIVE METABOLIC PANEL
ALT: 10 U/L (ref 0–35)
ANION GAP: 9 (ref 5–15)
AST: 18 U/L (ref 0–37)
Albumin: 3.9 g/dL (ref 3.5–5.2)
Alkaline Phosphatase: 97 U/L (ref 39–117)
BILIRUBIN TOTAL: 0.5 mg/dL (ref 0.3–1.2)
BUN: 52 mg/dL — AB (ref 6–23)
CHLORIDE: 103 mmol/L (ref 96–112)
CO2: 24 mmol/L (ref 19–32)
Calcium: 9.1 mg/dL (ref 8.4–10.5)
Creatinine, Ser: 2.42 mg/dL — ABNORMAL HIGH (ref 0.50–1.10)
GFR calc Af Amer: 20 mL/min — ABNORMAL LOW (ref >90)
GFR calc non Af Amer: 17 mL/min — ABNORMAL LOW (ref >90)
Glucose, Bld: 268 mg/dL — ABNORMAL HIGH (ref 70–99)
Potassium: 4.3 mmol/L (ref 3.5–5.1)
Sodium: 136 mmol/L (ref 135–145)
Total Protein: 7.8 g/dL (ref 6.0–8.3)

## 2014-07-22 LAB — MRSA PCR SCREENING: MRSA BY PCR: POSITIVE — AB

## 2014-07-22 LAB — MAGNESIUM: Magnesium: 2.2 mg/dL (ref 1.5–2.5)

## 2014-07-22 LAB — TROPONIN I: Troponin I: <0.03 ng/mL (ref <0.031)

## 2014-07-22 LAB — TSH: TSH: 1.743 u[IU]/mL (ref 0.350–4.500)

## 2014-07-22 LAB — STREP PNEUMONIAE URINARY ANTIGEN: Strep Pneumo Urinary Antigen: NEGATIVE

## 2014-07-22 MED ORDER — HEPARIN SODIUM (PORCINE) 5000 UNIT/ML IJ SOLN
5000.0000 [IU] | Freq: Three times a day (TID) | INTRAMUSCULAR | Status: DC
  Administered 2014-07-22 – 2014-07-25 (×10): 5000 [IU] via SUBCUTANEOUS
  Filled 2014-07-22 (×10): qty 1

## 2014-07-22 MED ORDER — VANCOMYCIN HCL IN DEXTROSE 1-5 GM/200ML-% IV SOLN
INTRAVENOUS | Status: AC
  Filled 2014-07-22: qty 200

## 2014-07-22 MED ORDER — ACETAMINOPHEN 325 MG PO TABS
650.0000 mg | ORAL_TABLET | Freq: Four times a day (QID) | ORAL | Status: DC | PRN
  Administered 2014-07-22: 650 mg via ORAL
  Filled 2014-07-22: qty 2

## 2014-07-22 MED ORDER — VANCOMYCIN HCL IN DEXTROSE 1-5 GM/200ML-% IV SOLN
1000.0000 mg | Freq: Once | INTRAVENOUS | Status: AC
  Administered 2014-07-22: 1000 mg via INTRAVENOUS

## 2014-07-22 MED ORDER — DEXTROSE 5 % IV SOLN
1.0000 g | Freq: Three times a day (TID) | INTRAVENOUS | Status: DC
  Filled 2014-07-22: qty 1

## 2014-07-22 MED ORDER — VANCOMYCIN HCL IN DEXTROSE 750-5 MG/150ML-% IV SOLN
750.0000 mg | INTRAVENOUS | Status: DC
  Administered 2014-07-23 – 2014-07-25 (×3): 750 mg via INTRAVENOUS
  Filled 2014-07-22 (×4): qty 150

## 2014-07-22 MED ORDER — SODIUM CHLORIDE 0.9 % IV SOLN
INTRAVENOUS | Status: DC
  Administered 2014-07-22: 02:00:00 via INTRAVENOUS

## 2014-07-22 MED ORDER — DEXTROSE 5 % IV SOLN
1.0000 g | INTRAVENOUS | Status: DC
  Administered 2014-07-23 – 2014-07-25 (×3): 1 g via INTRAVENOUS
  Filled 2014-07-22 (×4): qty 1

## 2014-07-22 MED ORDER — DEXTROSE 5 % IV SOLN
1.0000 g | Freq: Once | INTRAVENOUS | Status: AC
  Administered 2014-07-22: 1 g via INTRAVENOUS

## 2014-07-22 NOTE — Progress Notes (Signed)
ANTIBIOTIC CONSULT NOTE - INITIAL  Pharmacy Consult for Vancomycin and Cefepime Indication: pneumonia  No Known Allergies  Patient Measurements: Height: 5\' 4"  (162.6 cm) Weight: 149 lb 11.1 oz (67.9 kg) IBW/kg (Calculated) : 54.7  Vital Signs: Temp: 98.6 F (37 C) (02/26 0255) Temp Source: Oral (02/26 0255) BP: 127/85 mmHg (02/26 0255) Pulse Rate: 112 (02/26 0255) Intake/Output from previous day: 02/25 0701 - 02/26 0700 In: 553.8 [I.V.:353.8; IV Piggyback:200] Out: 400 [Urine:400] Intake/Output from this shift:    Labs:  Recent Labs  07/21/14 2253 07/22/14 0622  WBC 12.7* 12.8*  HGB 12.5 11.6*  PLT 219 213  CREATININE 2.58* 2.42*   Estimated Creatinine Clearance: 16.7 mL/min (by C-G formula based on Cr of 2.42). No results for input(s): VANCOTROUGH, VANCOPEAK, VANCORANDOM, GENTTROUGH, GENTPEAK, GENTRANDOM, TOBRATROUGH, TOBRAPEAK, TOBRARND, AMIKACINPEAK, AMIKACINTROU, AMIKACIN in the last 72 hours.   Microbiology: Recent Results (from the past 720 hour(s))  Culture, blood (routine x 2) Call MD if unable to obtain prior to antibiotics being given     Status: None (Preliminary result)   Collection Time: 07/22/14  2:13 AM  Result Value Ref Range Status   Specimen Description Blood LEFT ARM  Final   Special Requests BOTTLES DRAWN AEROBIC AND ANAEROBIC 6 CC EACH  Final   Culture NO GROWTH <24 HRS  Final   Report Status PENDING  Incomplete  Culture, blood (routine x 2) Call MD if unable to obtain prior to antibiotics being given     Status: None (Preliminary result)   Collection Time: 07/22/14  2:13 AM  Result Value Ref Range Status   Specimen Description Blood RIGHT ARM  Final   Special Requests BOTTLES DRAWN AEROBIC AND ANAEROBIC 6 CC EACH  Final   Culture NO GROWTH <24 HRS  Final   Report Status PENDING  Incomplete   Medical History: Past Medical History  Diagnosis Date  . Chronic kidney disease     acute renal failure  . COPD (chronic obstructive pulmonary  disease)   . Diabetes mellitus   . Depression   . DEMENTIA   . Hypertension   . Shortness of breath   . GERD (gastroesophageal reflux disease)   . Gallstones   . Diverticulosis   . Concern about mental disorder without diagnosis   . Dehydration   . Anxiety   . Dysphagia   . Anemia   . Cholelithiases   . Renal stones   . Hyperlipidemia   . Hypokalemia   . Dementia    Anti-infectives    Start     Dose/Rate Route Frequency Ordered Stop   07/23/14 0600  vancomycin (VANCOCIN) IVPB 750 mg/150 ml premix     750 mg 150 mL/hr over 60 Minutes Intravenous Every 24 hours 07/22/14 0731     07/23/14 0200  ceFEPIme (MAXIPIME) 1 g in dextrose 5 % 50 mL IVPB     1 g 100 mL/hr over 30 Minutes Intravenous Every 24 hours 07/22/14 0730     07/22/14 0215  vancomycin (VANCOCIN) IVPB 1000 mg/200 mL premix     1,000 mg 200 mL/hr over 60 Minutes Intravenous  Once 07/22/14 0208 07/22/14 0614   07/22/14 0215  ceFEPIme (MAXIPIME) 1 g in dextrose 5 % 50 mL IVPB     1 g 100 mL/hr over 30 Minutes Intravenous  Once 07/22/14 0211 07/22/14 0247   07/22/14 0145  ceFEPIme (MAXIPIME) 1 g in dextrose 5 % 50 mL IVPB  Status:  Discontinued     1 g  100 mL/hr over 30 Minutes Intravenous 3 times per day 07/22/14 0139 07/22/14 0211     Assessment: 81yo female admitted with pneumonia.  Asked to initiate Vancomycin and Cefepime.  Initial doses given on admission.  SCr is elevated.  Estimated Creatinine Clearance: 16.7 mL/min (by C-G formula based on Cr of 2.42).  Goal of Therapy:  Vancomycin trough level 15-20 mcg/ml Eradicate infection.  Plan:  Vancomycin 750mg  IV q24hrs Check trough at steady state Cefepime 1gm IV q24hrs Monitor labs, renal fxn, and cultures  Hart Robinsons A 07/22/2014,7:32 AM

## 2014-07-22 NOTE — Care Management Note (Addendum)
    Page 1 of 1   07/25/2014     9:47:59 AM CARE MANAGEMENT NOTE 07/25/2014  Patient:  FINLAY, MILLS   Account Number:  [id number]  Date Initiated:  07/22/2014  Documentation initiated by:  Theophilus Kinds  Subjective/Objective Assessment:   Pt admitted from [facility] with possible aspiration pneumonia. Anticipate pt will discharge back to facility at discharge.     Action/Plan:   CSW is aware and will arrange discharge back to SNF when medically stable.   Anticipated DC Date:  07/23/2014   Anticipated DC Plan:  SKILLED NURSING FACILITY  In-house referral  Clinical Social Worker      DC Planning Services  CM consult      Choice offered to / List presented to:             Status of service:  Completed, signed off Medicare Important Message given?  YES (If response is "NO", the following Medicare IM given date fields will be blank) Date Medicare IM given:  07/22/2014 Medicare IM given by:  Christinia Gully C Date Additional Medicare IM given:  07/25/2014 Additional Medicare IM given by:  Theophilus Kinds  Discharge Disposition:  [facility]  Per UR Regulation:    If discussed at Long Length of Stay Meetings, dates discussed:    Comments:  07/25/14 0940 Christinia Gully, RN BSN CM Pt discharged back to [facility] today. CSW to arrange discharge to facility.  07/22/14 0815 Christinia Gully, RN BSN CM
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
## 2014-07-22 NOTE — Progress Notes (Signed)
ANTIBIOTIC CONSULT NOTE-Preliminary  Pharmacy Consult for Vancomycin and renal adjustment of cefepime Indication: Pneumonia  No Known Allergies  Patient Measurements: Weight: 150 lb (68.04 kg)   Vital Signs: Temp: 98.7 F (37.1 C) (02/25 2153) Temp Source: Oral (02/25 2153) BP: 130/88 mmHg (02/26 0130) Pulse Rate: 58 (02/26 0030)  Labs:  Recent Labs  07/21/14 2253  WBC 12.7*  HGB 12.5  PLT 219  CREATININE 2.58*    CrCl cannot be calculated (Unknown ideal weight.).  No results for input(s): VANCOTROUGH, VANCOPEAK, VANCORANDOM, GENTTROUGH, GENTPEAK, GENTRANDOM, TOBRATROUGH, TOBRAPEAK, TOBRARND, AMIKACINPEAK, AMIKACINTROU, AMIKACIN in the last 72 hours.   Microbiology: No results found for this or any previous visit (from the past 720 hour(s)).  Medical History: Past Medical History  Diagnosis Date  . Chronic kidney disease     acute renal failure  . COPD (chronic obstructive pulmonary disease)   . Diabetes mellitus   . Depression   . DEMENTIA   . Hypertension   . Shortness of breath   . GERD (gastroesophageal reflux disease)   . Gallstones   . Diverticulosis   . Concern about mental disorder without diagnosis   . Dehydration   . Anxiety   . Dysphagia   . Anemia   . Cholelithiases   . Renal stones   . Hyperlipidemia   . Hypokalemia   . Dementia     Medications:   Assessment: 81 yo female nursing home resident seen in the ED for head trauma, aspiration, acute on chronic kidney disease, atrial fib., and with recent report of increased cough. Pt has elevated WBCs and possible aspiration pneumonia. Broad spectrum antibiotics to be started to cover aspiration/HCAP.  Goal of Therapy:  Eradicate infection  Plan:  Preliminary review of pertinent patient information completed.  Protocol will be initiated with a one-time doses of Vancomycin 1 Gm IV and 1 Gm cefepime (per renal adjustment of initial q8h cefepime order).  Forestine Na clinical pharmacist will  complete review during morning rounds to assess patient and finalize treatment regimen.  Norberto Sorenson, [facility] 07/22/2014,2:11 AM

## 2014-07-22 NOTE — Plan of Care (Signed)
Problem: Phase I Progression Outcomes Goal: Dyspnea controlled at rest Outcome: Completed/Met Date Met:  07/22/14 Oxygen in use
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
## 2014-07-22 NOTE — Progress Notes (Signed)
UR completed 

## 2014-07-22 NOTE — Progress Notes (Signed)
Subjective: Patient was admitted last night from [facility] home. She had head trauma at the nursing home and she developed recurrent vomiting following the episode. She is admitted as possible case of aspiration pneumonia. Her CT Scan didn't show any sign of head injury.  Objective: Vital signs in last 24 hours: Temp:  [98.6 F (37 C)-98.7 F (37.1 C)] 98.6 F (37 C) (02/26 0255) Pulse Rate:  [58-112] 112 (02/26 0255) Resp:  [18-20] 20 (02/26 0255) BP: (126-146)/(62-88) 127/85 mmHg (02/26 0255) SpO2:  [93 %-98 %] 94 % (02/26 0255) Weight:  [67.9 kg (149 lb 11.1 oz)-68.04 kg (150 lb)] 67.9 kg (149 lb 11.1 oz) (02/26 0255) Weight change:     Intake/Output from previous day: 02/25 0701 - 02/26 0700 In: 553.8 [I.V.:353.8; IV Piggyback:200] Out: 400 [Urine:400]  PHYSICAL EXAM General appearance: no distress and confused and disoriented Resp: diminished breath sounds bilaterally and rhonchi bilaterally Cardio: S1, S2 normal GI: soft, non-tender; bowel sounds normal; no masses,  no organomegaly Extremities: extremities normal, atraumatic, no cyanosis or edema  Lab Results:  Results for orders placed or performed during the hospital encounter of 07/21/14 (from the past 48 hour(s))  CBC with Differential     Status: Abnormal   Collection Time: 07/21/14 10:53 PM  Result Value Ref Range   WBC 12.7 (H) 4.0 - 10.5 K/uL   RBC 4.22 3.87 - 5.11 MIL/uL   Hemoglobin 12.5 12.0 - 15.0 g/dL   HCT 38.8 36.0 - 46.0 %   MCV 91.9 78.0 - 100.0 fL   MCH 29.6 26.0 - 34.0 pg   MCHC 32.2 30.0 - 36.0 g/dL   RDW 14.1 11.5 - 15.5 %   Platelets 219 150 - 400 K/uL   Neutrophils Relative % 79 (H) 43 - 77 %   Neutro Abs 10.1 (H) 1.7 - 7.7 K/uL   Lymphocytes Relative 13 12 - 46 %   Lymphs Abs 1.6 0.7 - 4.0 K/uL   Monocytes Relative 6 3 - 12 %   Monocytes Absolute 0.8 0.1 - 1.0 K/uL   Eosinophils Relative 2 0 - 5 %   Eosinophils Absolute 0.2 0.0 - 0.7 K/uL   Basophils Relative 0 0 - 1 %    Basophils Absolute 0.0 0.0 - 0.1 K/uL  Brain natriuretic peptide     Status: None   Collection Time: 07/21/14 10:53 PM  Result Value Ref Range   B Natriuretic Peptide 55.0 0.0 - 100.0 pg/mL  Basic metabolic panel     Status: Abnormal   Collection Time: 07/21/14 10:53 PM  Result Value Ref Range   Sodium 140 135 - 145 mmol/L   Potassium 4.7 3.5 - 5.1 mmol/L   Chloride 104 96 - 112 mmol/L   CO2 28 19 - 32 mmol/L   Glucose, Bld 177 (H) 70 - 99 mg/dL   BUN 50 (H) 6 - 23 mg/dL   Creatinine, Ser 2.58 (H) 0.50 - 1.10 mg/dL   Calcium 9.9 8.4 - 10.5 mg/dL   GFR calc non Af Amer 16 (L) >90 mL/min   GFR calc Af Amer 19 (L) >90 mL/min    Comment: (NOTE) The eGFR has been calculated using the CKD EPI equation. This calculation has not been validated in all clinical situations. eGFR's persistently <90 mL/min signify possible Chronic Kidney Disease.    Anion gap 8 5 - 15  Troponin I     Status: None   Collection Time: 07/21/14 10:53 PM  Result Value Ref Range  Troponin I <0.03 <0.031 ng/mL    Comment:        NO INDICATION OF MYOCARDIAL INJURY.   TSH     Status: None   Collection Time: 07/21/14 11:20 PM  Result Value Ref Range   TSH 1.743 0.350 - 4.500 uIU/mL  Magnesium     Status: None   Collection Time: 07/21/14 11:20 PM  Result Value Ref Range   Magnesium 2.2 1.5 - 2.5 mg/dL  Urinalysis, Routine w reflex microscopic     Status: Abnormal   Collection Time: 07/22/14 12:06 AM  Result Value Ref Range   Color, Urine YELLOW YELLOW   APPearance HAZY (A) CLEAR   Specific Gravity, Urine 1.015 1.005 - 1.030   pH 5.5 5.0 - 8.0   Glucose, UA NEGATIVE NEGATIVE mg/dL   Hgb urine dipstick TRACE (A) NEGATIVE   Bilirubin Urine NEGATIVE NEGATIVE   Ketones, ur NEGATIVE NEGATIVE mg/dL   Protein, ur NEGATIVE NEGATIVE mg/dL   Urobilinogen, UA 0.2 0.0 - 1.0 mg/dL   Nitrite NEGATIVE NEGATIVE   Leukocytes, UA MODERATE (A) NEGATIVE  Urine microscopic-add on     Status: Abnormal   Collection Time:  07/22/14 12:06 AM  Result Value Ref Range   Squamous Epithelial / LPF FEW (A) RARE   WBC, UA 0-2 <3 WBC/hpf   RBC / HPF 0-2 <3 RBC/hpf   Bacteria, UA MANY (A) RARE  Culture, blood (routine x 2) Call MD if unable to obtain prior to antibiotics being given     Status: None (Preliminary result)   Collection Time: 07/22/14  2:13 AM  Result Value Ref Range   Specimen Description Blood LEFT ARM    Special Requests BOTTLES DRAWN AEROBIC AND ANAEROBIC 6 CC EACH    Culture NO GROWTH <24 HRS    Report Status PENDING   Culture, blood (routine x 2) Call MD if unable to obtain prior to antibiotics being given     Status: None (Preliminary result)   Collection Time: 07/22/14  2:13 AM  Result Value Ref Range   Specimen Description Blood RIGHT ARM    Special Requests BOTTLES DRAWN AEROBIC AND ANAEROBIC 6 CC EACH    Culture NO GROWTH <24 HRS    Report Status PENDING   Comprehensive metabolic panel     Status: Abnormal   Collection Time: 07/22/14  6:22 AM  Result Value Ref Range   Sodium 136 135 - 145 mmol/L   Potassium 4.3 3.5 - 5.1 mmol/L   Chloride 103 96 - 112 mmol/L   CO2 24 19 - 32 mmol/L   Glucose, Bld 268 (H) 70 - 99 mg/dL   BUN 52 (H) 6 - 23 mg/dL   Creatinine, Ser 2.42 (H) 0.50 - 1.10 mg/dL   Calcium 9.1 8.4 - 10.5 mg/dL   Total Protein 7.8 6.0 - 8.3 g/dL   Albumin 3.9 3.5 - 5.2 g/dL   AST 18 0 - 37 U/L   ALT 10 0 - 35 U/L   Alkaline Phosphatase 97 39 - 117 U/L   Total Bilirubin 0.5 0.3 - 1.2 mg/dL   GFR calc non Af Amer 17 (L) >90 mL/min   GFR calc Af Amer 20 (L) >90 mL/min    Comment: (NOTE) The eGFR has been calculated using the CKD EPI equation. This calculation has not been validated in all clinical situations. eGFR's persistently <90 mL/min signify possible Chronic Kidney Disease.    Anion gap 9 5 - 15  CBC WITH DIFFERENTIAL  Status: Abnormal   Collection Time: 07/22/14  6:22 AM  Result Value Ref Range   WBC 12.8 (H) 4.0 - 10.5 K/uL   RBC 3.96 3.87 - 5.11 MIL/uL    Hemoglobin 11.6 (L) 12.0 - 15.0 g/dL   HCT 35.8 (L) 36.0 - 46.0 %   MCV 90.4 78.0 - 100.0 fL   MCH 29.3 26.0 - 34.0 pg   MCHC 32.4 30.0 - 36.0 g/dL   RDW 13.9 11.5 - 15.5 %   Platelets 213 150 - 400 K/uL   Neutrophils Relative % 87 (H) 43 - 77 %   Neutro Abs 11.2 (H) 1.7 - 7.7 K/uL   Lymphocytes Relative 7 (L) 12 - 46 %   Lymphs Abs 0.9 0.7 - 4.0 K/uL   Monocytes Relative 6 3 - 12 %   Monocytes Absolute 0.7 0.1 - 1.0 K/uL   Eosinophils Relative 0 0 - 5 %   Eosinophils Absolute 0.0 0.0 - 0.7 K/uL   Basophils Relative 0 0 - 1 %   Basophils Absolute 0.0 0.0 - 0.1 K/uL    ABGS No results for input(s): PHART, PO2ART, TCO2, HCO3 in the last 72 hours.  Invalid input(s): PCO2 CULTURES Recent Results (from the past 240 hour(s))  Culture, blood (routine x 2) Call MD if unable to obtain prior to antibiotics being given     Status: None (Preliminary result)   Collection Time: 07/22/14  2:13 AM  Result Value Ref Range Status   Specimen Description Blood LEFT ARM  Final   Special Requests BOTTLES DRAWN AEROBIC AND ANAEROBIC 6 CC EACH  Final   Culture NO GROWTH <24 HRS  Final   Report Status PENDING  Incomplete  Culture, blood (routine x 2) Call MD if unable to obtain prior to antibiotics being given     Status: None (Preliminary result)   Collection Time: 07/22/14  2:13 AM  Result Value Ref Range Status   Specimen Description Blood RIGHT ARM  Final   Special Requests BOTTLES DRAWN AEROBIC AND ANAEROBIC 6 CC EACH  Final   Culture NO GROWTH <24 HRS  Final   Report Status PENDING  Incomplete   Studies/Results: Ct Head Wo Contrast  07/21/2014   CLINICAL DATA:  Vomiting after head injury. Objects were dropped on to the patient's head.  EXAM: CT HEAD WITHOUT CONTRAST  TECHNIQUE: Contiguous axial images were obtained from the base of the skull through the vertex without intravenous contrast.  COMPARISON:  10/24/2013  FINDINGS: Technically limited study due to motion artifact. Diffuse cerebral  atrophy. Ventricular dilatation consistent with central atrophy. Patchy low-attenuation changes throughout the deep white matter consistent with small vessel ischemia. No mass effect or midline shift. No abnormal extra-axial fluid collections. Gray-white matter junctions are distinct. Basal cisterns are not effaced. No evidence of acute intracranial hemorrhage. No depressed skull fractures. Opacification of paranasal sinuses. Vascular calcifications. Mastoid air cells are not opacified.  IMPRESSION: No acute intracranial abnormalities. Chronic atrophy and small vessel ischemic changes. Opacification of paranasal sinuses.   Electronically Signed   By: Lucienne Capers M.D.   On: 07/21/2014 22:22   Dg Chest Portable 1 View  07/21/2014   CLINICAL DATA:  Possible aspiration. Patient vomited and then there was a change in the sound of breathing.  EXAM: PORTABLE CHEST - 1 VIEW  COMPARISON:  10/29/2013  FINDINGS: Shallow inspiration with elevation of the right hemidiaphragm. Mild cardiac enlargement and pulmonary vascular congestion. Hazy interstitial changes likely representing mild edema. No focal consolidation.  No blunting of costophrenic angles. No pneumothorax no definite evidence of aspiration.  IMPRESSION: Cardiac enlargement with pulmonary vascular congestion and mild perihilar edema.   Electronically Signed   By: Lucienne Capers M.D.   On: 07/21/2014 22:20    Medications: I have reviewed the patient's current medications.  Assesment:   Active Problems:   Head trauma   Aspiration pneumonia   Atrial fibrillation acute on chronic renal failure Advanced dementia dysphasgia   Plan: Medications reviewed Continue combinations IV antibiotics Continue IV hydration Will monitor BMP Nephrology consult Speech consult.      [name] [name] 07/22/2014, 8:09 AM

## 2014-07-22 NOTE — ED Provider Notes (Signed)
CSN: [id number]     Arrival date & time 07/21/14  2140 History   First MD Initiated Contact with Patient 07/21/14 2214     Chief Complaint  Patient presents with  . Aspiration     (Consider location/radiation/quality/duration/timing/severity/associated sxs/prior Treatment) The history is provided by the nursing home and a relative.   [name] [name] is a 81 y.o. female with a past medical history of chronic kidney disease, copd, DM and dementia presenting with head injury followed by several episodes of emesis and now cough and increased noisy breathing.  Several dinner plates at her nursing home fell off a dinner cart, hitting her in the head this evening.  She seemed fine for several hours, but then had emesis x 2.  Afterward, the nursing staff felt her breathing seemed more labored and noisy so was sent here for evaluation of possible aspiration.  She is unable to give a history secondary to dementia.  She has been afebrile.  Son at bedside states she has had an increased cough for the past several days.    Past Medical History  Diagnosis Date  . Chronic kidney disease     acute renal failure  . COPD (chronic obstructive pulmonary disease)   . Diabetes mellitus   . Depression   . DEMENTIA   . Hypertension   . Shortness of breath   . GERD (gastroesophageal reflux disease)   . Gallstones   . Diverticulosis   . Concern about mental disorder without diagnosis   . Dehydration   . Anxiety   . Dysphagia   . Anemia   . Cholelithiases   . Renal stones   . Hyperlipidemia   . Hypokalemia   . Dementia    Past Surgical History  Procedure Laterality Date  . Abdominal hysterectomy    . Cystoscopy  2008    with stent placement  . Extracorporeal shock wave lithotripsy  03/13/2011    Procedure: EXTRACORPOREAL SHOCK WAVE LITHOTRIPSY (ESWL);  Surgeon: Marissa Nestle;  Location: [facility];  Service: Urology;  Laterality: Right;  Right Renal Calculus  . Cystoscopy with litholapaxy   03/28/2011    Procedure: CYSTOSCOPY WITH LITHOLAPAXY;  Surgeon: Marissa Nestle;  Location: [facility];  Service: Urology;  Laterality: Right;  . Cystoscopy w/ ureteral stent placement  03/28/2011    Procedure: CYSTOSCOPY WITH RETROGRADE PYELOGRAM/URETERAL STENT PLACEMENT;  Surgeon: Marissa Nestle;  Location: [facility];  Service: Urology;  Laterality: Right;  . Extracorporeal shock wave lithotripsy  09/25/2011    Procedure: EXTRACORPOREAL SHOCK WAVE LITHOTRIPSY (ESWL);  Surgeon: Marissa Nestle, MD;  Location: [facility];  Service: Urology;  Laterality: Right;  ESWL Right Renal Calculus  . Esophagogastroduodenoscopy  03/19/2005    NOM:VEHMCNO antral gastritis, need to rule out H pylori infection/Pyloric channel ulcer without stigmata of bleed/Bulbar ulcer with duodenitis without stigmata of bleed  . Esophagogastroduodenoscopy N/A 09/19/2013    Dr. Oneida Alar: distal gastritis, probable proximal esophageal web s/p Savary dilation , gastric polyp, chronic gastritis  . Esophageal dilation  09/19/2013    Procedure: ESOPHAGEAL DILATION;  Surgeon: Danie Binder, MD;  Location: [facility];  Service: Endoscopy;;   Family History  Problem Relation Age of Onset  . Anesthesia problems Neg Hx   . Hypotension Neg Hx   . Malignant hyperthermia Neg Hx   . Pseudochol deficiency Neg Hx    History  Substance Use Topics  . Smoking status: Former Smoker -- 0.50 packs/day for 30 years  Types: Cigarettes    Quit date: 03/25/2003  . Smokeless tobacco: Not on file  . Alcohol Use: No   OB History    No data available     Review of Systems  Unable to perform ROS: Dementia  Constitutional: Negative for fever.  Respiratory: Positive for cough.   Gastrointestinal: Positive for vomiting.      Allergies  Review of patient's allergies indicates no known allergies.  Home Medications   Prior to Admission medications   Medication Sig Start Date End Date Taking? Authorizing Provider  acetaminophen (TYLENOL) 500  MG tablet Take 500 mg by mouth 2 (two) times daily.   Yes Historical Provider, MD  ALPRAZolam Duanne Moron) 1 MG tablet Take 1 mg by mouth 2 (two) times daily. *May take one-half tablet every 8 hours as needed for anxiety   Yes Historical Provider, MD  amLODipine (NORVASC) 5 MG tablet Take 5 mg by mouth daily.   Yes Historical Provider, MD  atorvastatin (LIPITOR) 10 MG tablet Take 10 mg by mouth daily.   Yes Historical Provider, MD  Calcium Carbonate-Vitamin D (CALCARB 600/D) 600-400 MG-UNIT per tablet Take 1 tablet by mouth 2 (two) times daily.   Yes Historical Provider, MD  escitalopram (LEXAPRO) 5 MG tablet Take 15 mg by mouth daily.   Yes Historical Provider, MD  feeding supplement, RESOURCE BREEZE, (RESOURCE BREEZE) LIQD Take 1 Container by mouth 3 (three) times daily between meals. 09/21/13  Yes Rosita Fire, MD  ferrous sulfate 325 (65 FE) MG tablet Take 325 mg by mouth every morning.   Yes Historical Provider, MD  fluticasone (FLONASE) 50 MCG/ACT nasal spray Place 2 sprays into the nose daily. allergies    Yes Historical Provider, MD  haloperidol lactate (HALDOL) 5 MG/ML injection Inject 1.5 mg into the muscle every 8 (eight) hours as needed (for agitation).   Yes Historical Provider, MD  HYDROcodone-acetaminophen (NORCO/VICODIN) 5-325 MG per tablet Take 1 tablet by mouth every 6 (six) hours as needed for moderate pain. pain Patient taking differently: Take 1 tablet by mouth every morning. May also take every 6 hours as needed for pain 11/01/13  Yes Rosita Fire, MD  lactulose (CHRONULAC) 10 GM/15ML solution Take 20 g by mouth every other day.   Yes Historical Provider, MD  Melatonin 3 MG TABS Take 1 tablet by mouth at bedtime.   Yes Historical Provider, MD  Omega-3 Fatty Acids (FISH OIL) 1000 MG CAPS Take 1 capsule by mouth daily.   Yes Historical Provider, MD  pantoprazole (PROTONIX) 40 MG tablet Take 1 tablet (40 mg total) by mouth daily before breakfast. 04/16/13  Yes Mahala Menghini, PA-C   polyethylene glycol powder (GLYCOLAX/MIRALAX) powder Take 17 g by mouth daily.   Yes Historical Provider, MD  QUEtiapine (SEROQUEL) 200 MG tablet Take 200 mg by mouth at bedtime.    Yes Historical Provider, MD  QUEtiapine Fumarate (SEROQUEL XR) 150 MG 24 hr tablet Take 150 mg by mouth every morning.   Yes Historical Provider, MD  sennosides-docusate sodium (SENOKOT-S) 8.6-50 MG tablet Take 2 tablets by mouth 2 (two) times daily.   Yes Historical Provider, MD  vitamin B-12 (CYANOCOBALAMIN) 1000 MCG tablet Take 1,000 mcg by mouth every morning.    Yes Historical Provider, MD  Vitamin D, Ergocalciferol, (DRISDOL) 50000 UNITS CAPS capsule Take 50,000 Units by mouth every 30 (thirty) days.   Yes Historical Provider, MD   BP 146/65 mmHg  Pulse 58  Temp(Src) 98.7 F (37.1 C) (Oral)  Resp 20  Wt 150 lb (68.04 kg)  SpO2 93% Physical Exam  Constitutional: She appears well-developed and well-nourished.  HENT:  Head: Normocephalic and atraumatic. Head is without contusion.  Eyes: Conjunctivae are normal. Pupils are equal, round, and reactive to light.  Neck: Normal range of motion.  Cardiovascular: Normal rate, regular rhythm, normal heart sounds and intact distal pulses.   Pulmonary/Chest: Effort normal. She has no wheezes. She has rales.  Bilateral rales, left greater than right  Abdominal: Soft. Bowel sounds are normal. There is no tenderness.  Musculoskeletal: Normal range of motion. She exhibits no edema.  No appreciable peripheral edema.  Neurological: She is alert.  Not cooperative for exam secondary to dementia. Unable to assess cranial nerves, strength and coordination.  Skin: Skin is warm and dry.  Psychiatric: She has a normal mood and affect.  Nursing note and vitals reviewed.   ED Course  Procedures (including critical care time) Labs Review Labs Reviewed  CBC WITH DIFFERENTIAL/PLATELET - Abnormal; Notable for the following:    WBC 12.7 (*)    Neutrophils Relative % 79 (*)     Neutro Abs 10.1 (*)    All other components within normal limits  BASIC METABOLIC PANEL - Abnormal; Notable for the following:    Glucose, Bld 177 (*)    BUN 50 (*)    Creatinine, Ser 2.58 (*)    GFR calc non Af Amer 16 (*)    GFR calc Af Amer 19 (*)    All other components within normal limits  URINALYSIS, ROUTINE W REFLEX MICROSCOPIC - Abnormal; Notable for the following:    APPearance HAZY (*)    Hgb urine dipstick TRACE (*)    Leukocytes, UA MODERATE (*)    All other components within normal limits  URINE MICROSCOPIC-ADD ON - Abnormal; Notable for the following:    Squamous Epithelial / LPF FEW (*)    Bacteria, UA MANY (*)    All other components within normal limits  BRAIN NATRIURETIC PEPTIDE  TROPONIN I  TSH  MAGNESIUM    Imaging Review Ct Head Wo Contrast  07/21/2014   CLINICAL DATA:  Vomiting after head injury. Objects were dropped on to the patient's head.  EXAM: CT HEAD WITHOUT CONTRAST  TECHNIQUE: Contiguous axial images were obtained from the base of the skull through the vertex without intravenous contrast.  COMPARISON:  10/24/2013  FINDINGS: Technically limited study due to motion artifact. Diffuse cerebral atrophy. Ventricular dilatation consistent with central atrophy. Patchy low-attenuation changes throughout the deep white matter consistent with small vessel ischemia. No mass effect or midline shift. No abnormal extra-axial fluid collections. Gray-white matter junctions are distinct. Basal cisterns are not effaced. No evidence of acute intracranial hemorrhage. No depressed skull fractures. Opacification of paranasal sinuses. Vascular calcifications. Mastoid air cells are not opacified.  IMPRESSION: No acute intracranial abnormalities. Chronic atrophy and small vessel ischemic changes. Opacification of paranasal sinuses.   Electronically Signed   By: Lucienne Capers M.D.   On: 07/21/2014 22:22   Dg Chest Portable 1 View  07/21/2014   CLINICAL DATA:  Possible  aspiration. Patient vomited and then there was a change in the sound of breathing.  EXAM: PORTABLE CHEST - 1 VIEW  COMPARISON:  10/29/2013  FINDINGS: Shallow inspiration with elevation of the right hemidiaphragm. Mild cardiac enlargement and pulmonary vascular congestion. Hazy interstitial changes likely representing mild edema. No focal consolidation. No blunting of costophrenic angles. No pneumothorax no definite evidence of aspiration.  IMPRESSION: Cardiac enlargement with pulmonary vascular  congestion and mild perihilar edema.   Electronically Signed   By: Lucienne Capers M.D.   On: 07/21/2014 22:20     EKG Interpretation None      MDM   Final diagnoses:  Atrial fibrillation, unspecified  Renal failure (ARF), acute on chronic  Aspiration pneumonia, unspecified aspiration pneumonia type   Patients labs and/or radiological studies were viewed and considered during the medical decision making and disposition process. Pt was seen by Dr Wilson Singer.  Discussed with Dr. Ernestina Patches of [facility] who will see pt in ed.  No temp admission orders given.    Evalee Jefferson, PA-C 07/22/14 0130  Virgel Manifold, MD 07/27/14 [phone]

## 2014-07-22 NOTE — ED Notes (Signed)
Assisted Janette with foley cath.

## 2014-07-22 NOTE — Clinical Social Work Psychosocial (Signed)
Clinical Social Work Department BRIEF PSYCHOSOCIAL ASSESSMENT 07/22/2014  Patient:  [name] [name], [name] [name]     Account Number:  [id number]     Admit date:  07/21/2014  Clinical Social Worker:  Wyatt Haste  Date/Time:  07/22/2014 02:12 PM  Referred by:  CSW  Date Referred:  07/22/2014 Referred for  SNF Placement   Other Referral:   Interview type:  Family Other interview type:   daughter- [name] [name]    PSYCHOSOCIAL DATA Living Status:  FACILITY Admitted from facility:  [facility] Level of care:  [place] Primary support name:  [name] [name] Primary support relationship to patient:  CHILD, ADULT Degree of support available:   supportive    CURRENT CONCERNS Current Concerns  Post-Acute Placement   Other Concerns:    SOCIAL WORK ASSESSMENT / PLAN CSW spoke with pt's daughter, [name] [name] as pt is not oriented. [name] [name] reports pt has been a resident at [facility] for about 8 years. Family lives locally and is very involved in pt's care. [name] [name] indicates that they have recently had several issues with [facility], but did not provide specifics. She said they have addressed them with staff at [facility]. [name] [name] initially thought they may be interested in new placement for pt, but called CSW back after speaking with her brother and they still want pt to return to [facility] at d/c. Per [name] [name] at facility, pt is nursing level of care and okay to return. Food cart accidently bumped into pt at [facility] and several food trays fell on pt. She had several episodes of vomiting with concern for aspiration. Pt admitted early this morning.   Assessment/plan status:  Psychosocial Support/Ongoing Assessment of Needs Other assessment/ plan:   Information/referral to community resources:   [facility]    PATIENT'S/FAMILY'S RESPONSE TO PLAN OF CARE: Pt unable to participate in assessment. Family agreeable to return to [facility] when medically stable. CSW will continue to follow.       [name] [name], [place]

## 2014-07-22 NOTE — H&P (Signed)
Hospitalist Admission History and Physical  Patient name: [name] [name] Medical record number: [id number] Date of birth: 01-27-1929 Age: 81 y.o. Gender: female  Primary Care Provider: Rosita Fire, MD  Chief Complaint: head trauma, aspiration, AKI, atrial fibrillation   History of Present Illness:This is a 81 y.o. year old female with significant past medical history of dementia, dysphagia, hypertension, GERD presenting with head trauma, aspiration, AK I, atrial fibrillation. Level V caveat as patient is chronically demented. Patient is resident of a local skilled nursing facility. Per report a staff member was moving a food cart when it accidentally bumped her that the patient having multiple plates hit the patient on the head. Per report, patient was otherwise stable female he afterwards. However, patient had multiple episodes of vomiting within one 2 hours afterwards. Family also reports patient having cough over the past 3-4 days. No reported fevers or chills in the nursing home. There was some concern about aspiration the patient was subsequently sent to the ER for further evaluation. Family also reports patient with progressively decreased appetite and chronic dehydration. On presentation temperature 98.7, heart rate in the 50s to 80s, respirations in the tens, blood pressure in the 120s to 140s. Satting 98% on room air. White blood cell count 12.7, creatinine 2.58. BNP 55. Troponin negative 1. EKG with rate controlled atrial fibrillation. Head CT with no acute intracranial abnormalities-chronic atrophy. Chest x-ray with cardiac enlargement with pulmonary vascular congestion and mild perihilar edema. Urinalysis indicative of infection. Assessment and Plan: [name] [name] is a 81 y.o. year old female presenting with head trauma, aspiration, AKI, atrial fibrillation  Active Problems:   Head trauma   Aspiration pneumonia   Atrial fibrillation   1 Head Trauma -Head CT within normal  limits -Baseline moderate to severe dementia -Continue to follow -Consider repeat head CT if patient's mentation acutely precipitates or recurrent emesis recurs  2-?Aspiration -High risk given recurrent episodes of emesis today with persistent cough -Noted a white blood cell count 12.7 -Afebrile -IV Vanco and cefepime given skilled nursing status -Pan culture -Continue to follow  3-AK I -Acute on chronic issue -Clinically dry on exam though with noted vascular congestion on imaging -Gently hydrate -Follow  4-atrial fibrillation -New onset -ChadVasc Score 4 -2D ECHO -tele bed -may consider holding anticoagulation given recent head trauma   5-Dementia -stable  -hold orals overnight -cont IM haldol   FEN/GI: NPO overnight Prophylaxis: sub q heparin  Disposition: pending further evaluation  Code Status:DNR   Patient Active Problem List   Diagnosis Date Noted  . Head trauma 07/22/2014  . UTI (lower urinary tract infection) 10/25/2013  . AKI (acute kidney injury) 10/25/2013  . Pneumonia 09/18/2013  . Abdominal pain 09/17/2013  . Dehydration 09/17/2013  . Abnormal weight loss 04/16/2013  . UTI (urinary tract infection) 03/28/2013  . Agitation 03/28/2013  . Aggressive behavior 03/28/2013  . Dementia with behavioral disturbance 03/28/2013  . Abdominal pain, chronic, generalized 02/26/2013   Past Medical History: Past Medical History  Diagnosis Date  . Chronic kidney disease     acute renal failure  . COPD (chronic obstructive pulmonary disease)   . Diabetes mellitus   . Depression   . DEMENTIA   . Hypertension   . Shortness of breath   . GERD (gastroesophageal reflux disease)   . Gallstones   . Diverticulosis   . Concern about mental disorder without diagnosis   . Dehydration   . Anxiety   . Dysphagia   . Anemia   .  Cholelithiases   . Renal stones   . Hyperlipidemia   . Hypokalemia   . Dementia     Past Surgical History: Past Surgical History   Procedure Laterality Date  . Abdominal hysterectomy    . Cystoscopy  2008    with stent placement  . Extracorporeal shock wave lithotripsy  03/13/2011    Procedure: EXTRACORPOREAL SHOCK WAVE LITHOTRIPSY (ESWL);  Surgeon: Marissa Nestle;  Location: [facility];  Service: Urology;  Laterality: Right;  Right Renal Calculus  . Cystoscopy with litholapaxy  03/28/2011    Procedure: CYSTOSCOPY WITH LITHOLAPAXY;  Surgeon: Marissa Nestle;  Location: [facility];  Service: Urology;  Laterality: Right;  . Cystoscopy w/ ureteral stent placement  03/28/2011    Procedure: CYSTOSCOPY WITH RETROGRADE PYELOGRAM/URETERAL STENT PLACEMENT;  Surgeon: Marissa Nestle;  Location: [facility];  Service: Urology;  Laterality: Right;  . Extracorporeal shock wave lithotripsy  09/25/2011    Procedure: EXTRACORPOREAL SHOCK WAVE LITHOTRIPSY (ESWL);  Surgeon: Marissa Nestle, MD;  Location: [facility];  Service: Urology;  Laterality: Right;  ESWL Right Renal Calculus  . Esophagogastroduodenoscopy  03/19/2005    FIE:PPIRJJO antral gastritis, need to rule out H pylori infection/Pyloric channel ulcer without stigmata of bleed/Bulbar ulcer with duodenitis without stigmata of bleed  . Esophagogastroduodenoscopy N/A 09/19/2013    Dr. Oneida Alar: distal gastritis, probable proximal esophageal web s/p Savary dilation , gastric polyp, chronic gastritis  . Esophageal dilation  09/19/2013    Procedure: ESOPHAGEAL DILATION;  Surgeon: Danie Binder, MD;  Location: [facility];  Service: Endoscopy;;    Social History: History   Social History  . Marital Status: Widowed    Spouse Name: N/A  . Number of Children: N/A  . Years of Education: N/A   Social History Main Topics  . Smoking status: Former Smoker -- 0.50 packs/day for 30 years    Types: Cigarettes    Quit date: 03/25/2003  . Smokeless tobacco: Not on file  . Alcohol Use: No  . Drug Use: No  . Sexual Activity: No   Other Topics Concern  . None   Social History Narrative     Family History: Family History  Problem Relation Age of Onset  . Anesthesia problems Neg Hx   . Hypotension Neg Hx   . Malignant hyperthermia Neg Hx   . Pseudochol deficiency Neg Hx     Allergies: No Known Allergies  Current Facility-Administered Medications  Medication Dose Route Frequency Provider Last Rate Last Dose  . 0.9 %  sodium chloride infusion   Intravenous Continuous Shanda Howells, MD      . ceFEPIme (MAXIPIME) 1 g in dextrose 5 % 50 mL IVPB  1 g Intravenous 3 times per day Shanda Howells, MD      . heparin injection 5,000 Units  5,000 Units Subcutaneous 3 times per day Shanda Howells, MD       Current Outpatient Prescriptions  Medication Sig Dispense Refill  . acetaminophen (TYLENOL) 500 MG tablet Take 500 mg by mouth 2 (two) times daily.    Marland Kitchen ALPRAZolam (XANAX) 1 MG tablet Take 1 mg by mouth 2 (two) times daily. *May take one-half tablet every 8 hours as needed for anxiety    . amLODipine (NORVASC) 5 MG tablet Take 5 mg by mouth daily.    Marland Kitchen atorvastatin (LIPITOR) 10 MG tablet Take 10 mg by mouth daily.    . Calcium Carbonate-Vitamin D (CALCARB 600/D) 600-400 MG-UNIT per tablet Take 1 tablet by mouth 2 (two)  times daily.    Marland Kitchen escitalopram (LEXAPRO) 5 MG tablet Take 15 mg by mouth daily.    . feeding supplement, RESOURCE BREEZE, (RESOURCE BREEZE) LIQD Take 1 Container by mouth 3 (three) times daily between meals. 90 Container 0  . ferrous sulfate 325 (65 FE) MG tablet Take 325 mg by mouth every morning.    . fluticasone (FLONASE) 50 MCG/ACT nasal spray Place 2 sprays into the nose daily. allergies     . haloperidol lactate (HALDOL) 5 MG/ML injection Inject 1.5 mg into the muscle every 8 (eight) hours as needed (for agitation).    Marland Kitchen HYDROcodone-acetaminophen (NORCO/VICODIN) 5-325 MG per tablet Take 1 tablet by mouth every 6 (six) hours as needed for moderate pain. pain (Patient taking differently: Take 1 tablet by mouth every morning. May also take every 6 hours as needed  for pain) 30 tablet 0  . lactulose (CHRONULAC) 10 GM/15ML solution Take 20 g by mouth every other day.    . Melatonin 3 MG TABS Take 1 tablet by mouth at bedtime.    . Omega-3 Fatty Acids (FISH OIL) 1000 MG CAPS Take 1 capsule by mouth daily.    . pantoprazole (PROTONIX) 40 MG tablet Take 1 tablet (40 mg total) by mouth daily before breakfast. 30 tablet 3  . polyethylene glycol powder (GLYCOLAX/MIRALAX) powder Take 17 g by mouth daily.    . QUEtiapine (SEROQUEL) 200 MG tablet Take 200 mg by mouth at bedtime.     Marland Kitchen QUEtiapine Fumarate (SEROQUEL XR) 150 MG 24 hr tablet Take 150 mg by mouth every morning.    . sennosides-docusate sodium (SENOKOT-S) 8.6-50 MG tablet Take 2 tablets by mouth 2 (two) times daily.    . vitamin B-12 (CYANOCOBALAMIN) 1000 MCG tablet Take 1,000 mcg by mouth every morning.     . Vitamin D, Ergocalciferol, (DRISDOL) 50000 UNITS CAPS capsule Take 50,000 Units by mouth every 30 (thirty) days.     Review Of Systems: 12 point ROS negative except as noted above in HPI.  Physical Exam: Filed Vitals:   07/22/14 0130  BP: 130/88  Pulse:   Temp:   Resp: 20    General: cachectic and confused, minimally cooperative to exam  HEENT: PERRLA, extra ocular movement intact and no head abrasions, scars noted  Heart: S1, S2 normal, no murmur, rub or gallop, regular rate and rhythm Lungs: clear to auscultation, no wheezes or rales and unlabored breathing Abdomen: abdomen is soft without significant tenderness, masses, organomegaly or guarding Extremities: extremities normal, atraumatic, no cyanosis or edema Skin:no rashes, no ecchymoses Neurology: uncooperative to exam  Labs and Imaging: Lab Results  Component Value Date/Time   NA 140 07/21/2014 10:53 PM   NA 141 03/01/2013   K 4.7 07/21/2014 10:53 PM   K 5.6 03/01/2013   CL 104 07/21/2014 10:53 PM   CO2 28 07/21/2014 10:53 PM   BUN 50* 07/21/2014 10:53 PM   BUN 45* 03/01/2013   CREATININE 2.58* 07/21/2014 10:53 PM    CREATININE 2.10 03/01/2013   GLUCOSE 177* 07/21/2014 10:53 PM   Lab Results  Component Value Date   WBC 12.7* 07/21/2014   HGB 12.5 07/21/2014   HCT 38.8 07/21/2014   MCV 91.9 07/21/2014   PLT 219 07/21/2014   Urinalysis    Component Value Date/Time   COLORURINE YELLOW 07/22/2014 0006   APPEARANCEUR HAZY* 07/22/2014 0006   LABSPEC 1.015 07/22/2014 0006   PHURINE 5.5 07/22/2014 0006   GLUCOSEU NEGATIVE 07/22/2014 0006   HGBUR TRACE* 07/22/2014  0006   BILIRUBINUR NEGATIVE 07/22/2014 0006   KETONESUR NEGATIVE 07/22/2014 0006   PROTEINUR NEGATIVE 07/22/2014 0006   UROBILINOGEN 0.2 07/22/2014 0006   NITRITE NEGATIVE 07/22/2014 0006   LEUKOCYTESUR MODERATE* 07/22/2014 0006       Ct Head Wo Contrast  07/21/2014   CLINICAL DATA:  Vomiting after head injury. Objects were dropped on to the patient's head.  EXAM: CT HEAD WITHOUT CONTRAST  TECHNIQUE: Contiguous axial images were obtained from the base of the skull through the vertex without intravenous contrast.  COMPARISON:  10/24/2013  FINDINGS: Technically limited study due to motion artifact. Diffuse cerebral atrophy. Ventricular dilatation consistent with central atrophy. Patchy low-attenuation changes throughout the deep white matter consistent with small vessel ischemia. No mass effect or midline shift. No abnormal extra-axial fluid collections. Gray-white matter junctions are distinct. Basal cisterns are not effaced. No evidence of acute intracranial hemorrhage. No depressed skull fractures. Opacification of paranasal sinuses. Vascular calcifications. Mastoid air cells are not opacified.  IMPRESSION: No acute intracranial abnormalities. Chronic atrophy and small vessel ischemic changes. Opacification of paranasal sinuses.   Electronically Signed   By: Lucienne Capers M.D.   On: 07/21/2014 22:22   Dg Chest Portable 1 View  07/21/2014   CLINICAL DATA:  Possible aspiration. Patient vomited and then there was a change in the sound of  breathing.  EXAM: PORTABLE CHEST - 1 VIEW  COMPARISON:  10/29/2013  FINDINGS: Shallow inspiration with elevation of the right hemidiaphragm. Mild cardiac enlargement and pulmonary vascular congestion. Hazy interstitial changes likely representing mild edema. No focal consolidation. No blunting of costophrenic angles. No pneumothorax no definite evidence of aspiration.  IMPRESSION: Cardiac enlargement with pulmonary vascular congestion and mild perihilar edema.   Electronically Signed   By: Lucienne Capers M.D.   On: 07/21/2014 22:20           Shanda Howells MD  Pager: [phone]

## 2014-07-22 NOTE — Progress Notes (Signed)
Inpatient Diabetes Program Recommendations  [facility]: New Consensus Statement on Inpatient Glycemic Control (2013)  Target Ranges:  Prepandial:   less than 140 mg/dL      Peak postprandial:   less than 180 mg/dL (1-2 hours)      Critically ill patients:  140 - 180 mg/dL   Results for [name] [name], [name] [name] (MRN [id number]) as of 07/22/2014 09:04  Ref. Range 07/21/2014 22:53 07/22/2014 06:22  Glucose Latest Range: 70-99 mg/dL 177 (H) 268 (H)    Diabetes history: No Outpatient Diabetes medications: NA Current orders for Inpatient glycemic control: None  Inpatient Diabetes Program Recommendations Correction (SSI): Fasting lab glucose is 268 mg/dl this morning. Please consider ordering CBGs wtih Novolog correction scale Q4H. HgbA1C: Please order an A1C to evaluate glycemic control over the past 2-3 months.  Thanks, Barnie Alderman, RN, MSN, CCRN, CDE Diabetes Coordinator Inpatient Diabetes Program [phone] (Team Pager) [phone] ([facility] office) [phone] [facility] office)

## 2014-07-22 NOTE — Plan of Care (Signed)
Problem: Phase I Progression Outcomes Goal: Voiding-avoid urinary catheter unless indicated Outcome: Not Applicable Date Met:  12/45/80 Foley catheter in use

## 2014-07-23 LAB — BASIC METABOLIC PANEL
ANION GAP: 7 (ref 5–15)
BUN: 44 mg/dL — ABNORMAL HIGH (ref 6–23)
CHLORIDE: 112 mmol/L (ref 96–112)
CO2: 22 mmol/L (ref 19–32)
Calcium: 8.7 mg/dL (ref 8.4–10.5)
Creatinine, Ser: 2.06 mg/dL — ABNORMAL HIGH (ref 0.50–1.10)
GFR calc Af Amer: 25 mL/min — ABNORMAL LOW (ref >90)
GFR calc non Af Amer: 21 mL/min — ABNORMAL LOW (ref >90)
Glucose, Bld: 150 mg/dL — ABNORMAL HIGH (ref 70–99)
Potassium: 4 mmol/L (ref 3.5–5.1)
Sodium: 141 mmol/L (ref 135–145)

## 2014-07-23 LAB — CBC WITH DIFFERENTIAL/PLATELET
BASOS ABS: 0 10*3/uL (ref 0.0–0.1)
BASOS PCT: 0 % (ref 0–1)
EOS ABS: 0 10*3/uL (ref 0.0–0.7)
Eosinophils Relative: 0 % (ref 0–5)
HCT: 32.7 % — ABNORMAL LOW (ref 36.0–46.0)
Hemoglobin: 10.4 g/dL — ABNORMAL LOW (ref 12.0–15.0)
Lymphocytes Relative: 13 % (ref 12–46)
Lymphs Abs: 1.4 10*3/uL (ref 0.7–4.0)
MCH: 28.8 pg (ref 26.0–34.0)
MCHC: 31.8 g/dL (ref 30.0–36.0)
MCV: 90.6 fL (ref 78.0–100.0)
Monocytes Absolute: 0.9 10*3/uL (ref 0.1–1.0)
Monocytes Relative: 8 % (ref 3–12)
NEUTROS ABS: 8.5 10*3/uL — AB (ref 1.7–7.7)
Neutrophils Relative %: 79 % — ABNORMAL HIGH (ref 43–77)
Platelets: 198 10*3/uL (ref 150–400)
RBC: 3.61 MIL/uL — ABNORMAL LOW (ref 3.87–5.11)
RDW: 14 % (ref 11.5–15.5)
WBC: 10.8 10*3/uL — ABNORMAL HIGH (ref 4.0–10.5)

## 2014-07-23 LAB — HIV ANTIBODY (ROUTINE TESTING W REFLEX): HIV SCREEN 4TH GENERATION: NONREACTIVE

## 2014-07-23 LAB — CLOSTRIDIUM DIFFICILE BY PCR: CDIFFPCR: NEGATIVE

## 2014-07-23 MED ORDER — ACETAMINOPHEN 650 MG RE SUPP
650.0000 mg | RECTAL | Status: DC | PRN
  Administered 2014-07-23: 650 mg via RECTAL
  Filled 2014-07-23 (×2): qty 1

## 2014-07-23 NOTE — Progress Notes (Signed)
Subjective: Patient is awake but confused and disoriented. She is spiking fever. .  Objective: Vital signs in last 24 hours: Temp:  [99.7 F (37.6 C)-100.2 F (37.9 C)] 100.2 F (37.9 C) (02/27 0500) Pulse Rate:  [55-96] 55 (02/27 0500) Resp:  [20] 20 (02/27 0500) BP: (126-144)/(63-86) 128/86 mmHg (02/27 0500) SpO2:  [94 %-95 %] 95 % (02/27 0500) Weight change:     Intake/Output from previous day: 02/26 0701 - 02/27 0700 In: 0  Out: 1200 [Urine:1200]  PHYSICAL EXAM General appearance: no distress and confused and disoriented Resp: diminished breath sounds bilaterally and rhonchi bilaterally Cardio: S1, S2 normal GI: soft, non-tender; bowel sounds normal; no masses,  no organomegaly Extremities: extremities normal, atraumatic, no cyanosis or edema  Lab Results:  Results for orders placed or performed during the hospital encounter of 07/21/14 (from the past 48 hour(s))  CBC with Differential     Status: Abnormal   Collection Time: 07/21/14 10:53 PM  Result Value Ref Range   WBC 12.7 (H) 4.0 - 10.5 K/uL   RBC 4.22 3.87 - 5.11 MIL/uL   Hemoglobin 12.5 12.0 - 15.0 g/dL   HCT 38.8 36.0 - 46.0 %   MCV 91.9 78.0 - 100.0 fL   MCH 29.6 26.0 - 34.0 pg   MCHC 32.2 30.0 - 36.0 g/dL   RDW 14.1 11.5 - 15.5 %   Platelets 219 150 - 400 K/uL   Neutrophils Relative % 79 (H) 43 - 77 %   Neutro Abs 10.1 (H) 1.7 - 7.7 K/uL   Lymphocytes Relative 13 12 - 46 %   Lymphs Abs 1.6 0.7 - 4.0 K/uL   Monocytes Relative 6 3 - 12 %   Monocytes Absolute 0.8 0.1 - 1.0 K/uL   Eosinophils Relative 2 0 - 5 %   Eosinophils Absolute 0.2 0.0 - 0.7 K/uL   Basophils Relative 0 0 - 1 %   Basophils Absolute 0.0 0.0 - 0.1 K/uL  Brain natriuretic peptide     Status: None   Collection Time: 07/21/14 10:53 PM  Result Value Ref Range   B Natriuretic Peptide 55.0 0.0 - 100.0 pg/mL  Basic metabolic panel     Status: Abnormal   Collection Time: 07/21/14 10:53 PM  Result Value Ref Range   Sodium 140 135 - 145  mmol/L   Potassium 4.7 3.5 - 5.1 mmol/L   Chloride 104 96 - 112 mmol/L   CO2 28 19 - 32 mmol/L   Glucose, Bld 177 (H) 70 - 99 mg/dL   BUN 50 (H) 6 - 23 mg/dL   Creatinine, Ser 2.58 (H) 0.50 - 1.10 mg/dL   Calcium 9.9 8.4 - 10.5 mg/dL   GFR calc non Af Amer 16 (L) >90 mL/min   GFR calc Af Amer 19 (L) >90 mL/min    Comment: (NOTE) The eGFR has been calculated using the CKD EPI equation. This calculation has not been validated in all clinical situations. eGFR's persistently <90 mL/min signify possible Chronic Kidney Disease.    Anion gap 8 5 - 15  Troponin I     Status: None   Collection Time: 07/21/14 10:53 PM  Result Value Ref Range   Troponin I <0.03 <0.031 ng/mL    Comment:        NO INDICATION OF MYOCARDIAL INJURY.   TSH     Status: None   Collection Time: 07/21/14 11:20 PM  Result Value Ref Range   TSH 1.743 0.350 - 4.500 uIU/mL  Magnesium  Status: None   Collection Time: 07/21/14 11:20 PM  Result Value Ref Range   Magnesium 2.2 1.5 - 2.5 mg/dL  Urinalysis, Routine w reflex microscopic     Status: Abnormal   Collection Time: 07/22/14 12:06 AM  Result Value Ref Range   Color, Urine YELLOW YELLOW   APPearance HAZY (A) CLEAR   Specific Gravity, Urine 1.015 1.005 - 1.030   pH 5.5 5.0 - 8.0   Glucose, UA NEGATIVE NEGATIVE mg/dL   Hgb urine dipstick TRACE (A) NEGATIVE   Bilirubin Urine NEGATIVE NEGATIVE   Ketones, ur NEGATIVE NEGATIVE mg/dL   Protein, ur NEGATIVE NEGATIVE mg/dL   Urobilinogen, UA 0.2 0.0 - 1.0 mg/dL   Nitrite NEGATIVE NEGATIVE   Leukocytes, UA MODERATE (A) NEGATIVE  Urine microscopic-add on     Status: Abnormal   Collection Time: 07/22/14 12:06 AM  Result Value Ref Range   Squamous Epithelial / LPF FEW (A) RARE   WBC, UA 0-2 <3 WBC/hpf   RBC / HPF 0-2 <3 RBC/hpf   Bacteria, UA MANY (A) RARE  Culture, blood (routine x 2) Call MD if unable to obtain prior to antibiotics being given     Status: None (Preliminary result)   Collection Time: 07/22/14   2:13 AM  Result Value Ref Range   Specimen Description Blood LEFT ARM    Special Requests BOTTLES DRAWN AEROBIC AND ANAEROBIC 6 CC EACH    Culture NO GROWTH 1 DAY    Report Status PENDING   Culture, blood (routine x 2) Call MD if unable to obtain prior to antibiotics being given     Status: None (Preliminary result)   Collection Time: 07/22/14  2:13 AM  Result Value Ref Range   Specimen Description Blood RIGHT ARM    Special Requests BOTTLES DRAWN AEROBIC AND ANAEROBIC 6 CC EACH    Culture NO GROWTH 1 DAY    Report Status PENDING   MRSA PCR Screening     Status: Abnormal   Collection Time: 07/22/14  5:48 AM  Result Value Ref Range   MRSA by PCR POSITIVE (A) NEGATIVE    Comment:        The GeneXpert MRSA Assay (FDA approved for NASAL specimens only), is one component of a comprehensive MRSA colonization surveillance program. It is not intended to diagnose MRSA infection nor to guide or monitor treatment for MRSA infections. RESULT CALLED TO, READ BACK BY AND VERIFIED WITH: R.CHAPPELLE AT 0911 ON 07/22/14 BY S.VANHOORNE   Strep pneumoniae urinary antigen     Status: None   Collection Time: 07/22/14  5:49 AM  Result Value Ref Range   Strep Pneumo Urinary Antigen NEGATIVE NEGATIVE    Comment: PERFORMED AT [facility]        Infection due to S. pneumoniae cannot be absolutely ruled out since the antigen present may be below the detection limit of the test. Performed at [facility]   Comprehensive metabolic panel     Status: Abnormal   Collection Time: 07/22/14  6:22 AM  Result Value Ref Range   Sodium 136 135 - 145 mmol/L   Potassium 4.3 3.5 - 5.1 mmol/L   Chloride 103 96 - 112 mmol/L   CO2 24 19 - 32 mmol/L   Glucose, Bld 268 (H) 70 - 99 mg/dL   BUN 52 (H) 6 - 23 mg/dL   Creatinine, Ser 2.42 (H) 0.50 - 1.10 mg/dL   Calcium 9.1 8.4 - 10.5 mg/dL   Total Protein 7.8  6.0 - 8.3 g/dL   Albumin 3.9 3.5 - 5.2 g/dL   AST 18 0 - 37 U/L   ALT 10 0 - 35 U/L    Alkaline Phosphatase 97 39 - 117 U/L   Total Bilirubin 0.5 0.3 - 1.2 mg/dL   GFR calc non Af Amer 17 (L) >90 mL/min   GFR calc Af Amer 20 (L) >90 mL/min    Comment: (NOTE) The eGFR has been calculated using the CKD EPI equation. This calculation has not been validated in all clinical situations. eGFR's persistently <90 mL/min signify possible Chronic Kidney Disease.    Anion gap 9 5 - 15  CBC WITH DIFFERENTIAL     Status: Abnormal   Collection Time: 07/22/14  6:22 AM  Result Value Ref Range   WBC 12.8 (H) 4.0 - 10.5 K/uL   RBC 3.96 3.87 - 5.11 MIL/uL   Hemoglobin 11.6 (L) 12.0 - 15.0 g/dL   HCT 35.8 (L) 36.0 - 46.0 %   MCV 90.4 78.0 - 100.0 fL   MCH 29.3 26.0 - 34.0 pg   MCHC 32.4 30.0 - 36.0 g/dL   RDW 13.9 11.5 - 15.5 %   Platelets 213 150 - 400 K/uL   Neutrophils Relative % 87 (H) 43 - 77 %   Neutro Abs 11.2 (H) 1.7 - 7.7 K/uL   Lymphocytes Relative 7 (L) 12 - 46 %   Lymphs Abs 0.9 0.7 - 4.0 K/uL   Monocytes Relative 6 3 - 12 %   Monocytes Absolute 0.7 0.1 - 1.0 K/uL   Eosinophils Relative 0 0 - 5 %   Eosinophils Absolute 0.0 0.0 - 0.7 K/uL   Basophils Relative 0 0 - 1 %   Basophils Absolute 0.0 0.0 - 0.1 K/uL  Clostridium Difficile by PCR     Status: None   Collection Time: 07/23/14  5:28 AM  Result Value Ref Range   C difficile by pcr NEGATIVE NEGATIVE  CBC WITH DIFFERENTIAL     Status: Abnormal   Collection Time: 07/23/14  6:50 AM  Result Value Ref Range   WBC 10.8 (H) 4.0 - 10.5 K/uL   RBC 3.61 (L) 3.87 - 5.11 MIL/uL   Hemoglobin 10.4 (L) 12.0 - 15.0 g/dL   HCT 32.7 (L) 36.0 - 46.0 %   MCV 90.6 78.0 - 100.0 fL   MCH 28.8 26.0 - 34.0 pg   MCHC 31.8 30.0 - 36.0 g/dL   RDW 14.0 11.5 - 15.5 %   Platelets 198 150 - 400 K/uL   Neutrophils Relative % 79 (H) 43 - 77 %   Neutro Abs 8.5 (H) 1.7 - 7.7 K/uL   Lymphocytes Relative 13 12 - 46 %   Lymphs Abs 1.4 0.7 - 4.0 K/uL   Monocytes Relative 8 3 - 12 %   Monocytes Absolute 0.9 0.1 - 1.0 K/uL   Eosinophils Relative  0 0 - 5 %   Eosinophils Absolute 0.0 0.0 - 0.7 K/uL   Basophils Relative 0 0 - 1 %   Basophils Absolute 0.0 0.0 - 0.1 K/uL  Basic metabolic panel     Status: Abnormal   Collection Time: 07/23/14  6:50 AM  Result Value Ref Range   Sodium 141 135 - 145 mmol/L   Potassium 4.0 3.5 - 5.1 mmol/L   Chloride 112 96 - 112 mmol/L    Comment: DELTA CHECK NOTED   CO2 22 19 - 32 mmol/L   Glucose, Bld 150 (H) 70 - 99 mg/dL  BUN 44 (H) 6 - 23 mg/dL   Creatinine, Ser 2.06 (H) 0.50 - 1.10 mg/dL   Calcium 8.7 8.4 - 10.5 mg/dL   GFR calc non Af Amer 21 (L) >90 mL/min   GFR calc Af Amer 25 (L) >90 mL/min    Comment: (NOTE) The eGFR has been calculated using the CKD EPI equation. This calculation has not been validated in all clinical situations. eGFR's persistently <90 mL/min signify possible Chronic Kidney Disease.    Anion gap 7 5 - 15    ABGS No results for input(s): PHART, PO2ART, TCO2, HCO3 in the last 72 hours.  Invalid input(s): PCO2 CULTURES Recent Results (from the past 240 hour(s))  Culture, blood (routine x 2) Call MD if unable to obtain prior to antibiotics being given     Status: None (Preliminary result)   Collection Time: 07/22/14  2:13 AM  Result Value Ref Range Status   Specimen Description Blood LEFT ARM  Final   Special Requests BOTTLES DRAWN AEROBIC AND ANAEROBIC 6 CC EACH  Final   Culture NO GROWTH 1 DAY  Final   Report Status PENDING  Incomplete  Culture, blood (routine x 2) Call MD if unable to obtain prior to antibiotics being given     Status: None (Preliminary result)   Collection Time: 07/22/14  2:13 AM  Result Value Ref Range Status   Specimen Description Blood RIGHT ARM  Final   Special Requests BOTTLES DRAWN AEROBIC AND ANAEROBIC 6 CC EACH  Final   Culture NO GROWTH 1 DAY  Final   Report Status PENDING  Incomplete  MRSA PCR Screening     Status: Abnormal   Collection Time: 07/22/14  5:48 AM  Result Value Ref Range Status   MRSA by PCR POSITIVE (A) NEGATIVE  Final    Comment:        The GeneXpert MRSA Assay (FDA approved for NASAL specimens only), is one component of a comprehensive MRSA colonization surveillance program. It is not intended to diagnose MRSA infection nor to guide or monitor treatment for MRSA infections. RESULT CALLED TO, READ BACK BY AND VERIFIED WITH: R.CHAPPELLE AT 0911 ON 07/22/14 BY S.VANHOORNE   Clostridium Difficile by PCR     Status: None   Collection Time: 07/23/14  5:28 AM  Result Value Ref Range Status   C difficile by pcr NEGATIVE NEGATIVE Final   Studies/Results: Ct Head Wo Contrast  07/21/2014   CLINICAL DATA:  Vomiting after head injury. Objects were dropped on to the patient's head.  EXAM: CT HEAD WITHOUT CONTRAST  TECHNIQUE: Contiguous axial images were obtained from the base of the skull through the vertex without intravenous contrast.  COMPARISON:  10/24/2013  FINDINGS: Technically limited study due to motion artifact. Diffuse cerebral atrophy. Ventricular dilatation consistent with central atrophy. Patchy low-attenuation changes throughout the deep white matter consistent with small vessel ischemia. No mass effect or midline shift. No abnormal extra-axial fluid collections. Gray-white matter junctions are distinct. Basal cisterns are not effaced. No evidence of acute intracranial hemorrhage. No depressed skull fractures. Opacification of paranasal sinuses. Vascular calcifications. Mastoid air cells are not opacified.  IMPRESSION: No acute intracranial abnormalities. Chronic atrophy and small vessel ischemic changes. Opacification of paranasal sinuses.   Electronically Signed   By: Lucienne Capers M.D.   On: 07/21/2014 22:22   Dg Chest Portable 1 View  07/21/2014   CLINICAL DATA:  Possible aspiration. Patient vomited and then there was a change in the sound of breathing.  EXAM:  PORTABLE CHEST - 1 VIEW  COMPARISON:  10/29/2013  FINDINGS: Shallow inspiration with elevation of the right hemidiaphragm. Mild cardiac  enlargement and pulmonary vascular congestion. Hazy interstitial changes likely representing mild edema. No focal consolidation. No blunting of costophrenic angles. No pneumothorax no definite evidence of aspiration.  IMPRESSION: Cardiac enlargement with pulmonary vascular congestion and mild perihilar edema.   Electronically Signed   By: Lucienne Capers M.D.   On: 07/21/2014 22:20    Medications: I have reviewed the patient's current medications.  Assesment:   Active Problems:   Head trauma   Aspiration pneumonia   Atrial fibrillation acute on chronic renal failure Advanced dementia dysphasgia   Plan: Medications reviewed Continue combinations IV antibiotics Continue IV hydration Will monitor BMP Nephrology consult appreciated Oral feeding according to speech recommendation.    LOS: 1 day   Layali Freund 07/23/2014, 9:20 AM

## 2014-07-23 NOTE — Progress Notes (Signed)
Notified Dr. Luan Pulling that the patient had ran some possible runs of V. Tach.  Voiced to that the patient has dementia and is agitated and was doing a lot of movement in the bed.  The telemetry is displaying a lot artifact so a true reading is hard to get.  The patient appears to be stable at this time.  The patient has has new on set of a. Fib and this was voiced to the MD.  So we will keep the telemetry on for now and continue to monitor her.

## 2014-07-23 NOTE — Progress Notes (Signed)
Subjective: Interval History: none.  Objective: Vital signs in last 24 hours: Temp:  [99.7 F (37.6 C)-100.2 F (37.9 C)] 100.2 F (37.9 C) (02/27 0500) Pulse Rate:  [55-96] 55 (02/27 0500) Resp:  [20] 20 (02/27 0500) BP: (126-144)/(63-86) 128/86 mmHg (02/27 0500) SpO2:  [94 %-95 %] 95 % (02/27 0500) Weight change:   Intake/Output from previous day: 02/26 0701 - 02/27 0700 In: 0  Out: 1200 [Urine:1200] Intake/Output this shift:    Generally patient is alert and restless. Patient continuously talks but not clear what she is complaining about. Chest is clear to auscultation Heart heart exam regular rate and rhythm no murmur Extremities no edema  Lab Results:  Recent Labs  07/22/14 0622 07/23/14 0650  WBC 12.8* 10.8*  HGB 11.6* 10.4*  HCT 35.8* 32.7*  PLT 213 198   BMET:  Recent Labs  07/22/14 0622 07/23/14 0650  NA 136 141  K 4.3 4.0  CL 103 112  CO2 24 22  GLUCOSE 268* 150*  BUN 52* 44*  CREATININE 2.42* 2.06*  CALCIUM 9.1 8.7   No results for input(s): PTH in the last 72 hours. Iron Studies: No results for input(s): IRON, TIBC, TRANSFERRIN, FERRITIN in the last 72 hours.  Studies/Results: Ct Head Wo Contrast  07/21/2014   CLINICAL DATA:  Vomiting after head injury. Objects were dropped on to the patient's head.  EXAM: CT HEAD WITHOUT CONTRAST  TECHNIQUE: Contiguous axial images were obtained from the base of the skull through the vertex without intravenous contrast.  COMPARISON:  10/24/2013  FINDINGS: Technically limited study due to motion artifact. Diffuse cerebral atrophy. Ventricular dilatation consistent with central atrophy. Patchy low-attenuation changes throughout the deep white matter consistent with small vessel ischemia. No mass effect or midline shift. No abnormal extra-axial fluid collections. Gray-white matter junctions are distinct. Basal cisterns are not effaced. No evidence of acute intracranial hemorrhage. No depressed skull fractures.  Opacification of paranasal sinuses. Vascular calcifications. Mastoid air cells are not opacified.  IMPRESSION: No acute intracranial abnormalities. Chronic atrophy and small vessel ischemic changes. Opacification of paranasal sinuses.   Electronically Signed   By: Lucienne Capers M.D.   On: 07/21/2014 22:22   Dg Chest Portable 1 View  07/21/2014   CLINICAL DATA:  Possible aspiration. Patient vomited and then there was a change in the sound of breathing.  EXAM: PORTABLE CHEST - 1 VIEW  COMPARISON:  10/29/2013  FINDINGS: Shallow inspiration with elevation of the right hemidiaphragm. Mild cardiac enlargement and pulmonary vascular congestion. Hazy interstitial changes likely representing mild edema. No focal consolidation. No blunting of costophrenic angles. No pneumothorax no definite evidence of aspiration.  IMPRESSION: Cardiac enlargement with pulmonary vascular congestion and mild perihilar edema.   Electronically Signed   By: Lucienne Capers M.D.   On: 07/21/2014 22:20    I have reviewed the patient's current medications.  Assessment/Plan: Problem #1 acute kidney injury: Possibly prerenal versus ATN. Presently she is nonoliguric and her renal function seems to be improving progressively. Problem #2 history of a trial fibrillation: Her heart rate is controlled Problem #3 severe dementia Problem #4 anemia: Her hemoglobin and hematocrit is declining. Problem #5 hypertension: Her blood pressure is reasonably controlled Problem #6 chronic renal failure: Stage III. Problem #7 metabolism on disease: Her calcium is in range. Plan: We'll continue his present management We'll check her basic metabolic panel in the morning    LOS: 1 day   Veera Stapleton S 07/23/2014,8:52 AM

## 2014-07-24 LAB — BASIC METABOLIC PANEL
Anion gap: 2 — ABNORMAL LOW (ref 5–15)
BUN: 37 mg/dL — ABNORMAL HIGH (ref 6–23)
CALCIUM: 8.3 mg/dL — AB (ref 8.4–10.5)
CO2: 23 mmol/L (ref 19–32)
Chloride: 116 mmol/L — ABNORMAL HIGH (ref 96–112)
Creatinine, Ser: 1.54 mg/dL — ABNORMAL HIGH (ref 0.50–1.10)
GFR calc non Af Amer: 30 mL/min — ABNORMAL LOW (ref >90)
GFR, EST AFRICAN AMERICAN: 35 mL/min — AB (ref >90)
Glucose, Bld: 155 mg/dL — ABNORMAL HIGH (ref 70–99)
Potassium: 3.5 mmol/L (ref 3.5–5.1)
SODIUM: 141 mmol/L (ref 135–145)

## 2014-07-24 LAB — CBC WITH DIFFERENTIAL/PLATELET
Basophils Absolute: 0 10*3/uL (ref 0.0–0.1)
Basophils Relative: 0 % (ref 0–1)
EOS ABS: 0 10*3/uL (ref 0.0–0.7)
Eosinophils Relative: 0 % (ref 0–5)
HEMATOCRIT: 29.5 % — AB (ref 36.0–46.0)
Hemoglobin: 9.5 g/dL — ABNORMAL LOW (ref 12.0–15.0)
LYMPHS PCT: 17 % (ref 12–46)
Lymphs Abs: 1.5 10*3/uL (ref 0.7–4.0)
MCH: 29.3 pg (ref 26.0–34.0)
MCHC: 32.2 g/dL (ref 30.0–36.0)
MCV: 91 fL (ref 78.0–100.0)
MONOS PCT: 8 % (ref 3–12)
Monocytes Absolute: 0.7 10*3/uL (ref 0.1–1.0)
Neutro Abs: 6.4 10*3/uL (ref 1.7–7.7)
Neutrophils Relative %: 75 % (ref 43–77)
PLATELETS: 178 10*3/uL (ref 150–400)
RBC: 3.24 MIL/uL — ABNORMAL LOW (ref 3.87–5.11)
RDW: 14 % (ref 11.5–15.5)
WBC: 8.6 10*3/uL (ref 4.0–10.5)

## 2014-07-24 MED ORDER — QUETIAPINE FUMARATE ER 50 MG PO TB24
150.0000 mg | ORAL_TABLET | Freq: Every morning | ORAL | Status: DC
  Administered 2014-07-25: 150 mg via ORAL
  Filled 2014-07-24 (×4): qty 3

## 2014-07-24 MED ORDER — ALPRAZOLAM 1 MG PO TABS
1.0000 mg | ORAL_TABLET | Freq: Four times a day (QID) | ORAL | Status: DC | PRN
  Administered 2014-07-24 – 2014-07-25 (×3): 1 mg via ORAL
  Filled 2014-07-24 (×3): qty 1

## 2014-07-24 MED ORDER — ALPRAZOLAM 1 MG PO TABS
1.0000 mg | ORAL_TABLET | Freq: Two times a day (BID) | ORAL | Status: DC
  Administered 2014-07-24: 1 mg via ORAL
  Filled 2014-07-24: qty 1

## 2014-07-24 NOTE — Progress Notes (Signed)
Subjective: Interval History: none.  Objective: Vital signs in last 24 hours: Temp:  [98 F (36.7 C)-98.1 F (36.7 C)] 98 F (36.7 C) (02/28 0559) Pulse Rate:  [69-71] 71 (02/28 0559) Resp:  [20] 20 (02/28 0559) BP: (135-156)/(89-99) 156/89 mmHg (02/28 0559) SpO2:  [95 %-96 %] 96 % (02/28 0559) Weight change:   Intake/Output from previous day: 02/27 0701 - 02/28 0700 In: 1200 [I.V.:1000; IV Piggyback:200] Out: 2150 [Urine:2150] Intake/Output this shift:    Generally patient is alert and restless. Patient is shouting today saying no. Very difficult to examine her as patient pulls examining hand. Seems to be very agitated. Chest is clear to auscultation Heart heart exam regular rate and rhythm no murmur Extremities no edema  Lab Results:  Recent Labs  07/23/14 0650 07/24/14 0654  WBC 10.8* 8.6  HGB 10.4* 9.5*  HCT 32.7* 29.5*  PLT 198 178   BMET:   Recent Labs  07/23/14 0650 07/24/14 0654  NA 141 141  K 4.0 3.5  CL 112 116*  CO2 22 23  GLUCOSE 150* 155*  BUN 44* 37*  CREATININE 2.06* 1.54*  CALCIUM 8.7 8.3*   No results for input(s): PTH in the last 72 hours. Iron Studies: No results for input(s): IRON, TIBC, TRANSFERRIN, FERRITIN in the last 72 hours.  Studies/Results: No results found.  I have reviewed the patient's current medications.  Assessment/Plan: Problem #1 acute kidney injury: Possibly prerenal versus ATN. Renal function is progressively improving. Presently her creatinine is 1.54 slightly above her baseline. Her potassium is normal. Problem #2 history of a trial fibrillation: Her heart rate is controlled. Patient is presently restless and seems to have episode of tachycardia. Problem #3 severe dementia: Patient also with behavioral problem. Problem #4 anemia: Her hemoglobin and hematocrit is declining. Presently her hemoglobin is below target goal. Problem #5 hypertension: Her blood pressure is reasonably controlled Problem #6 chronic renal  failure: Stage III. Problem #7 metabolism on disease: Her calcium is in range. Plan: We'll continue his present management We'll check her basic metabolic panel in the morning    LOS: 2 days   [name] [name] S 07/24/2014,10:12 AM

## 2014-07-24 NOTE — Progress Notes (Signed)
Late Entry: 07/24/2014   Discussed with Dr. Legrand Rams the patients agitation.  Voiced to him that it was hard to get good interpretation of her telemetry reading due to increasing movement and she and the leads will not stay on.  I asked him if I could remove her telemetry he said it was okay to d/c.  I also voiced to him that the patient had been having V. Tach like episodes but was for sure it was a true reading due to lots of artifact.  New orders given also for her xanax.  Will continue to monitor.

## 2014-07-25 LAB — BASIC METABOLIC PANEL
ANION GAP: 7 (ref 5–15)
BUN: 33 mg/dL — ABNORMAL HIGH (ref 6–23)
CALCIUM: 8.8 mg/dL (ref 8.4–10.5)
CO2: 23 mmol/L (ref 19–32)
Chloride: 114 mmol/L — ABNORMAL HIGH (ref 96–112)
Creatinine, Ser: 1.23 mg/dL — ABNORMAL HIGH (ref 0.50–1.10)
GFR calc Af Amer: 46 mL/min — ABNORMAL LOW (ref >90)
GFR calc non Af Amer: 39 mL/min — ABNORMAL LOW (ref >90)
GLUCOSE: 110 mg/dL — AB (ref 70–99)
POTASSIUM: 3.5 mmol/L (ref 3.5–5.1)
SODIUM: 144 mmol/L (ref 135–145)

## 2014-07-25 LAB — LEGIONELLA ANTIGEN, URINE

## 2014-07-25 LAB — URINE CULTURE
COLONY COUNT: NO GROWTH
Culture: NO GROWTH

## 2014-07-25 LAB — CBC WITH DIFFERENTIAL/PLATELET
Basophils Absolute: 0 10*3/uL (ref 0.0–0.1)
Basophils Relative: 0 % (ref 0–1)
EOS PCT: 1 % (ref 0–5)
Eosinophils Absolute: 0 10*3/uL (ref 0.0–0.7)
HCT: 32.8 % — ABNORMAL LOW (ref 36.0–46.0)
Hemoglobin: 10.6 g/dL — ABNORMAL LOW (ref 12.0–15.0)
LYMPHS PCT: 17 % (ref 12–46)
Lymphs Abs: 1.5 10*3/uL (ref 0.7–4.0)
MCH: 29.4 pg (ref 26.0–34.0)
MCHC: 32.3 g/dL (ref 30.0–36.0)
MCV: 90.9 fL (ref 78.0–100.0)
Monocytes Absolute: 0.6 10*3/uL (ref 0.1–1.0)
Monocytes Relative: 7 % (ref 3–12)
NEUTROS ABS: 6.5 10*3/uL (ref 1.7–7.7)
Neutrophils Relative %: 76 % (ref 43–77)
PLATELETS: 208 10*3/uL (ref 150–400)
RBC: 3.61 MIL/uL — AB (ref 3.87–5.11)
RDW: 13.8 % (ref 11.5–15.5)
WBC: 8.6 10*3/uL (ref 4.0–10.5)

## 2014-07-25 MED ORDER — AMOXICILLIN-POT CLAVULANATE 500-125 MG PO TABS: 1.0000 | ORAL_TABLET | Freq: Three times a day (TID) | ORAL | Status: AC

## 2014-07-25 NOTE — Progress Notes (Signed)
Subjective: This is late entry progress note for 07/24/2014 Patient is awake but confused and refusing her care.She is not yet restarted on  Psychoactive medication. Her fever has subsided.  Objective: Vital signs in last 24 hours: Temp:  [98.1 F (36.7 C)-98.9 F (37.2 C)] 98.9 F (37.2 C) (02/28 2221) Pulse Rate:  [60-80] 77 (02/29 0612) Resp:  [20-24] 20 (02/29 0612) BP: (130-157)/(60-89) 130/89 mmHg (02/29 0612) SpO2:  [94 %-100 %] 99 % (02/29 0815) Weight change:  Last BM Date: 07/24/14  Intake/Output from previous day: 02/28 0701 - 02/29 0700 In: 987.5 [P.O.:60; I.V.:927.5] Out: 1200 [Urine:1200]  PHYSICAL EXAM General appearance: no distress and confused and disoriented Resp: diminished breath sounds bilaterally and rhonchi bilaterally Cardio: S1, S2 normal GI: soft, non-tender; bowel sounds normal; no masses,  no organomegaly Extremities: extremities normal, atraumatic, no cyanosis or edema  Lab Results:  Results for orders placed or performed during the hospital encounter of 07/21/14 (from the past 48 hour(s))  CBC WITH DIFFERENTIAL     Status: Abnormal   Collection Time: 07/24/14  6:54 AM  Result Value Ref Range   WBC 8.6 4.0 - 10.5 K/uL    Comment: WHITE COUNT CONFIRMED ON SMEAR   RBC 3.24 (L) 3.87 - 5.11 MIL/uL   Hemoglobin 9.5 (L) 12.0 - 15.0 g/dL   HCT 29.5 (L) 36.0 - 46.0 %   MCV 91.0 78.0 - 100.0 fL   MCH 29.3 26.0 - 34.0 pg   MCHC 32.2 30.0 - 36.0 g/dL   RDW 14.0 11.5 - 15.5 %   Platelets 178 150 - 400 K/uL    Comment: PLATELET COUNT CONFIRMED BY SMEAR   Neutrophils Relative % 75 43 - 77 %   Lymphocytes Relative 17 12 - 46 %   Monocytes Relative 8 3 - 12 %   Eosinophils Relative 0 0 - 5 %   Basophils Relative 0 0 - 1 %   Neutro Abs 6.4 1.7 - 7.7 K/uL   Lymphs Abs 1.5 0.7 - 4.0 K/uL   Monocytes Absolute 0.7 0.1 - 1.0 K/uL   Eosinophils Absolute 0.0 0.0 - 0.7 K/uL   Basophils Absolute 0.0 0.0 - 0.1 K/uL   RBC Morphology POLYCHROMASIA PRESENT    Basic metabolic panel     Status: Abnormal   Collection Time: 07/24/14  6:54 AM  Result Value Ref Range   Sodium 141 135 - 145 mmol/L   Potassium 3.5 3.5 - 5.1 mmol/L   Chloride 116 (H) 96 - 112 mmol/L   CO2 23 19 - 32 mmol/L   Glucose, Bld 155 (H) 70 - 99 mg/dL   BUN 37 (H) 6 - 23 mg/dL   Creatinine, Ser 1.54 (H) 0.50 - 1.10 mg/dL   Calcium 8.3 (L) 8.4 - 10.5 mg/dL   GFR calc non Af Amer 30 (L) >90 mL/min   GFR calc Af Amer 35 (L) >90 mL/min    Comment: (NOTE) The eGFR has been calculated using the CKD EPI equation. This calculation has not been validated in all clinical situations. eGFR's persistently <90 mL/min signify possible Chronic Kidney Disease.    Anion gap 2 (L) 5 - 15    ABGS No results for input(s): PHART, PO2ART, TCO2, HCO3 in the last 72 hours.  Invalid input(s): PCO2 CULTURES Recent Results (from the past 240 hour(s))  Culture, blood (routine x 2) Call MD if unable to obtain prior to antibiotics being given     Status: None (Preliminary result)   Collection Time:  07/22/14  2:13 AM  Result Value Ref Range Status   Specimen Description Blood LEFT ARM  Final   Special Requests BOTTLES DRAWN AEROBIC AND ANAEROBIC 6 CC EACH  Final   Culture NO GROWTH 2 DAYS  Final   Report Status PENDING  Incomplete  Culture, blood (routine x 2) Call MD if unable to obtain prior to antibiotics being given     Status: None (Preliminary result)   Collection Time: 07/22/14  2:13 AM  Result Value Ref Range Status   Specimen Description Blood RIGHT ARM  Final   Special Requests BOTTLES DRAWN AEROBIC AND ANAEROBIC 6 CC EACH  Final   Culture NO GROWTH 2 DAYS  Final   Report Status PENDING  Incomplete  MRSA PCR Screening     Status: Abnormal   Collection Time: 07/22/14  5:48 AM  Result Value Ref Range Status   MRSA by PCR POSITIVE (A) NEGATIVE Final    Comment:        The GeneXpert MRSA Assay (FDA approved for NASAL specimens only), is one component of a comprehensive MRSA  colonization surveillance program. It is not intended to diagnose MRSA infection nor to guide or monitor treatment for MRSA infections. RESULT CALLED TO, READ BACK BY AND VERIFIED WITH: R.CHAPPELLE AT 0911 ON 07/22/14 BY S.VANHOORNE   Clostridium Difficile by PCR     Status: None   Collection Time: 07/23/14  5:28 AM  Result Value Ref Range Status   C difficile by pcr NEGATIVE NEGATIVE Final   Studies/Results: No results found.  Medications: I have reviewed the patient's current medications.  Assesment:   Active Problems:   Head trauma   Aspiration pneumonia   Atrial fibrillation acute on chronic renal failure Advanced dementia dysphasgia   Plan: Medications reviewed Continue combinations IV antibiotics Continue IV hydration Will monitor BMP Will restart Seroquel     LOS: 3 days   [name] [name] 07/25/2014, 8:18 AM

## 2014-07-25 NOTE — Progress Notes (Signed)
UR chart review completed.  

## 2014-07-25 NOTE — Discharge Summary (Signed)
Physician Discharge Summary  Patient ID: [name] [name] MRN: [id number] DOB/AGE: 11/18/1928 81 y.o. Primary Care Physician:Nicolai Labonte, MD Admit date: 07/21/2014 Discharge date: 07/25/2014    Discharge Diagnoses:   Active Problems:   Head trauma   Aspiration pneumonia   Atrial fibrillation Dementia COPD Acute kidney injury    Medication List    STOP taking these medications        haloperidol lactate 5 MG/ML injection  Commonly known as:  HALDOL      TAKE these medications        acetaminophen 500 MG tablet  Commonly known as:  TYLENOL  Take 500 mg by mouth 2 (two) times daily.     ALPRAZolam 1 MG tablet  Commonly known as:  XANAX  Take 1 mg by mouth 2 (two) times daily. *May take one-half tablet every 8 hours as needed for anxiety     amLODipine 5 MG tablet  Commonly known as:  NORVASC  Take 5 mg by mouth daily.     amoxicillin-clavulanate 500-125 MG per tablet  Commonly known as:  AUGMENTIN  Take 1 tablet (500 mg total) by mouth 3 (three) times daily.     atorvastatin 10 MG tablet  Commonly known as:  LIPITOR  Take 10 mg by mouth daily.     CALCARB 600/D 600-400 MG-UNIT per tablet  Generic drug:  Calcium Carbonate-Vitamin D  Take 1 tablet by mouth 2 (two) times daily.     escitalopram 5 MG tablet  Commonly known as:  LEXAPRO  Take 15 mg by mouth daily.     feeding supplement (RESOURCE BREEZE) Liqd  Take 1 Container by mouth 3 (three) times daily between meals.     ferrous sulfate 325 (65 FE) MG tablet  Take 325 mg by mouth every morning.     Fish Oil 1000 MG Caps  Take 1 capsule by mouth daily.     fluticasone 50 MCG/ACT nasal spray  Commonly known as:  FLONASE  Place 2 sprays into the nose daily. allergies     HYDROcodone-acetaminophen 5-325 MG per tablet  Commonly known as:  NORCO/VICODIN  Take 1 tablet by mouth every 6 (six) hours as needed for moderate pain. pain     lactulose 10 GM/15ML solution  Commonly known as:  CHRONULAC  Take  20 g by mouth every other day.     Melatonin 3 MG Tabs  Take 1 tablet by mouth at bedtime.     pantoprazole 40 MG tablet  Commonly known as:  PROTONIX  Take 1 tablet (40 mg total) by mouth daily before breakfast.     polyethylene glycol powder powder  Commonly known as:  GLYCOLAX/MIRALAX  Take 17 g by mouth daily.     QUEtiapine 200 MG tablet  Commonly known as:  SEROQUEL  Take 200 mg by mouth at bedtime.     QUEtiapine Fumarate 150 MG 24 hr tablet  Commonly known as:  SEROQUEL XR  Take 150 mg by mouth every morning.     sennosides-docusate sodium 8.6-50 MG tablet  Commonly known as:  SENOKOT-S  Take 2 tablets by mouth 2 (two) times daily.     vitamin B-12 1000 MCG tablet  Commonly known as:  CYANOCOBALAMIN  Take 1,000 mcg by mouth every morning.     Vitamin D (Ergocalciferol) 50000 UNITS Caps capsule  Commonly known as:  DRISDOL  Take 50,000 Units by mouth every 30 (thirty) days.        Discharged Condition: improved  Consults: Nephrology  Significant Diagnostic Studies: Ct Head Wo Contrast  07/21/2014   CLINICAL DATA:  Vomiting after head injury. Objects were dropped on to the patient's head.  EXAM: CT HEAD WITHOUT CONTRAST  TECHNIQUE: Contiguous axial images were obtained from the base of the skull through the vertex without intravenous contrast.  COMPARISON:  10/24/2013  FINDINGS: Technically limited study due to motion artifact. Diffuse cerebral atrophy. Ventricular dilatation consistent with central atrophy. Patchy low-attenuation changes throughout the deep white matter consistent with small vessel ischemia. No mass effect or midline shift. No abnormal extra-axial fluid collections. Gray-white matter junctions are distinct. Basal cisterns are not effaced. No evidence of acute intracranial hemorrhage. No depressed skull fractures. Opacification of paranasal sinuses. Vascular calcifications. Mastoid air cells are not opacified.  IMPRESSION: No acute intracranial  abnormalities. Chronic atrophy and small vessel ischemic changes. Opacification of paranasal sinuses.   Electronically Signed   By: Lucienne Capers M.D.   On: 07/21/2014 22:22   Dg Chest Portable 1 View  07/21/2014   CLINICAL DATA:  Possible aspiration. Patient vomited and then there was a change in the sound of breathing.  EXAM: PORTABLE CHEST - 1 VIEW  COMPARISON:  10/29/2013  FINDINGS: Shallow inspiration with elevation of the right hemidiaphragm. Mild cardiac enlargement and pulmonary vascular congestion. Hazy interstitial changes likely representing mild edema. No focal consolidation. No blunting of costophrenic angles. No pneumothorax no definite evidence of aspiration.  IMPRESSION: Cardiac enlargement with pulmonary vascular congestion and mild perihilar edema.   Electronically Signed   By: Lucienne Capers M.D.   On: 07/21/2014 22:20    Lab Results: Basic Metabolic Panel:  Recent Labs  07/23/14 0650 07/24/14 0654  NA 141 141  K 4.0 3.5  CL 112 116*  CO2 22 23  GLUCOSE 150* 155*  BUN 44* 37*  CREATININE 2.06* 1.54*  CALCIUM 8.7 8.3*   Liver Function Tests: No results for input(s): AST, ALT, ALKPHOS, BILITOT, PROT, ALBUMIN in the last 72 hours.   CBC:  Recent Labs  07/23/14 0650 07/24/14 0654  WBC 10.8* 8.6  NEUTROABS 8.5* 6.4  HGB 10.4* 9.5*  HCT 32.7* 29.5*  MCV 90.6 91.0  PLT 198 178    Recent Results (from the past 240 hour(s))  Culture, blood (routine x 2) Call MD if unable to obtain prior to antibiotics being given     Status: None (Preliminary result)   Collection Time: 07/22/14  2:13 AM  Result Value Ref Range Status   Specimen Description Blood LEFT ARM  Final   Special Requests BOTTLES DRAWN AEROBIC AND ANAEROBIC 6 CC EACH  Final   Culture NO GROWTH 2 DAYS  Final   Report Status PENDING  Incomplete  Culture, blood (routine x 2) Call MD if unable to obtain prior to antibiotics being given     Status: None (Preliminary result)   Collection Time:  07/22/14  2:13 AM  Result Value Ref Range Status   Specimen Description Blood RIGHT ARM  Final   Special Requests BOTTLES DRAWN AEROBIC AND ANAEROBIC 6 CC EACH  Final   Culture NO GROWTH 2 DAYS  Final   Report Status PENDING  Incomplete  Culture, Urine     Status: None   Collection Time: 07/22/14  5:48 AM  Result Value Ref Range Status   Specimen Description URINE, CATHETERIZED  Final   Special Requests NONE  Final   Colony Count NO GROWTH Performed at [facility]   Final   Culture NO GROWTH  Performed at [facility]   Final   Report Status 07/25/2014 FINAL  Final  MRSA PCR Screening     Status: Abnormal   Collection Time: 07/22/14  5:48 AM  Result Value Ref Range Status   MRSA by PCR POSITIVE (A) NEGATIVE Final    Comment:        The GeneXpert MRSA Assay (FDA approved for NASAL specimens only), is one component of a comprehensive MRSA colonization surveillance program. It is not intended to diagnose MRSA infection nor to guide or monitor treatment for MRSA infections. RESULT CALLED TO, READ BACK BY AND VERIFIED WITH: R.CHAPPELLE AT 0911 ON 07/22/14 BY S.VANHOORNE   Clostridium Difficile by PCR     Status: None   Collection Time: 07/23/14  5:28 AM  Result Value Ref Range Status   C difficile by pcr NEGATIVE NEGATIVE Final     Hospital Course:   This is an 80 years old female from [place] [facility] who was admitted after patient had head trauma after she was accidentally bumped to food tray. She had recurrent vomiting. Her Ct scan of the showed no major injury. However, she had elevated BUN and CR and fever. She was admitted and was started on IV antibiotics for possible aspiration pneumonia. She was also rehydrated and nephrology consult done. Her renal function improved. Patient will be discharged on oral antibiotics.  Discharge Exam: Blood pressure 130/89, pulse 77, temperature 98.9 F (37.2 C), temperature source Axillary, resp. rate 20, height 5\' 4"   (1.626 m), weight 67.9 kg (149 lb 11.1 oz), SpO2 99 %.   Disposition:  Nursing home.      Signed: Arieanna Pressey   07/25/2014, 8:39 AM

## 2014-07-25 NOTE — Progress Notes (Signed)
Patient O2 Sat 98% on room air. IV and Foley catheter out. Ready for discharge.

## 2014-07-25 NOTE — Clinical Social Work Note (Signed)
Pt d/c today back to [facility]. Pt's daughter, Constance Holster and facility aware and agreeable. D/C summary faxed. Pt to transfer via [facility].  [name], [place]

## 2014-07-25 NOTE — Progress Notes (Signed)
Report called to [name] [name] at [facility]. IV and foley catheter removed. Patient ready for transport to [facility].

## 2014-07-25 NOTE — Progress Notes (Signed)
Subjective: Interval History: none.  Objective: Vital signs in last 24 hours: Temp:  [98.1 F (36.7 C)-98.9 F (37.2 C)] 98.9 F (37.2 C) (02/28 2221) Pulse Rate:  [60-80] 77 (02/29 0612) Resp:  [20-24] 20 (02/29 0612) BP: (130-157)/(60-89) 130/89 mmHg (02/29 0612) SpO2:  [94 %-100 %] 100 % (02/29 0612) Weight change:   Intake/Output from previous day: 02/28 0701 - 02/29 0700 In: 987.5 [P.O.:60; I.V.:927.5] Out: 1200 [Urine:1200] Intake/Output this shift:    Generally : Patient remained confused. However today she is less restless. Chest is clear to auscultation Heart heart exam regular rate and rhythm no murmur Extremities no edema  Lab Results:  Recent Labs  07/23/14 0650 07/24/14 0654  WBC 10.8* 8.6  HGB 10.4* 9.5*  HCT 32.7* 29.5*  PLT 198 178   BMET:   Recent Labs  07/23/14 0650 07/24/14 0654  NA 141 141  K 4.0 3.5  CL 112 116*  CO2 22 23  GLUCOSE 150* 155*  BUN 44* 37*  CREATININE 2.06* 1.54*  CALCIUM 8.7 8.3*   No results for input(s): PTH in the last 72 hours. Iron Studies: No results for input(s): IRON, TIBC, TRANSFERRIN, FERRITIN in the last 72 hours.  Studies/Results: No results found.  I have reviewed the patient's current medications.  Assessment/Plan: Problem #1 acute kidney injury: Possibly prerenal versus ATN. Renal function is progressively improving. Presently patient is nonoliguric. Her basic metabolic panel is still pending. Problem #2 history of a trial fibrillation: Her heart rate is controlled. Patient is presently restless and seems to have episode of tachycardia. Problem #3 severe dementia:  Problem #4 anemia: Her hemoglobin and hematocrit is declining. Presently her hemoglobin is below target goal. Problem #5 hypertension: Her blood pressure is reasonably controlled Problem #6 chronic renal failure: Stage III. Problem #7 metabolism on disease: Her calcium is in range. Plan: We'll continue his present management We'll  check her basic metabolic panel from this morning.    LOS: 3 days   [name] [name] S 07/25/2014,7:46 AM

## 2014-07-27 LAB — CULTURE, BLOOD (ROUTINE X 2)
CULTURE: NO GROWTH
CULTURE: NO GROWTH

## 2014-12-06 IMAGING — US US RENAL
1 series · 14 of 25 positions shown · non-contrast
Comparison: CT scan 02/21/2012

CLINICAL DATA: Kidney calculus

RENAL/URINARY TRACT ULTRASOUND
TECHNIQUE: Renal ultrasound

[Series 1: us renal · 0.23mm/px · 14 of 42 slices shown]
[im 1/42]
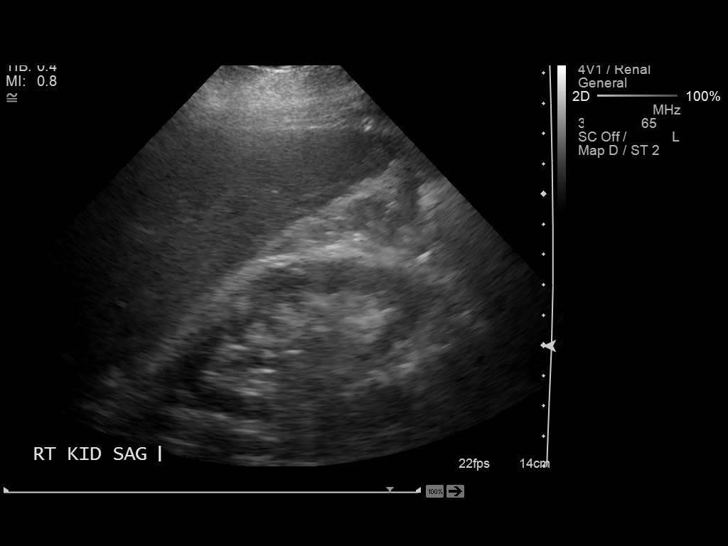
[im 4/42]
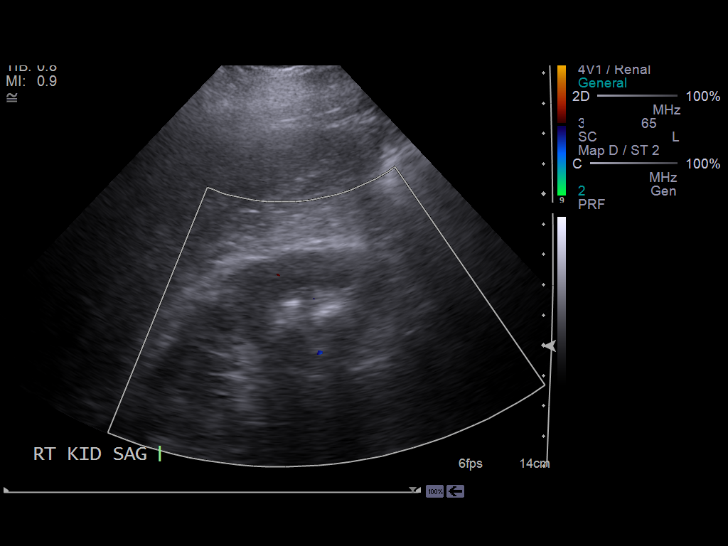
[im 7/42]
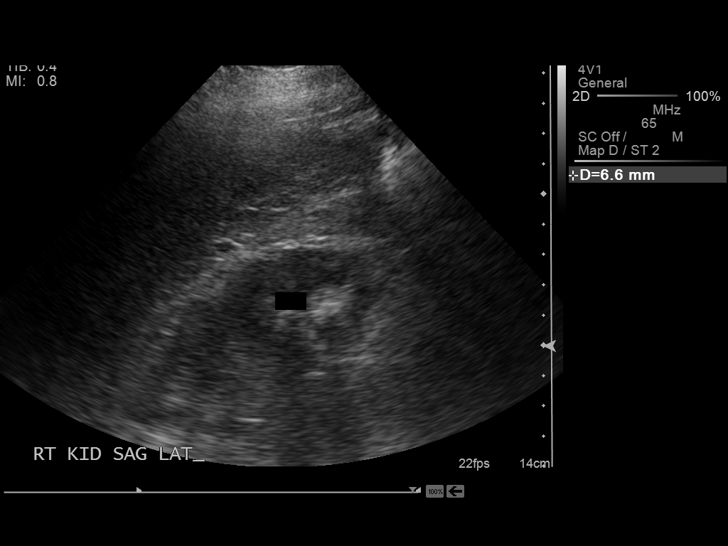
[im 11/42]
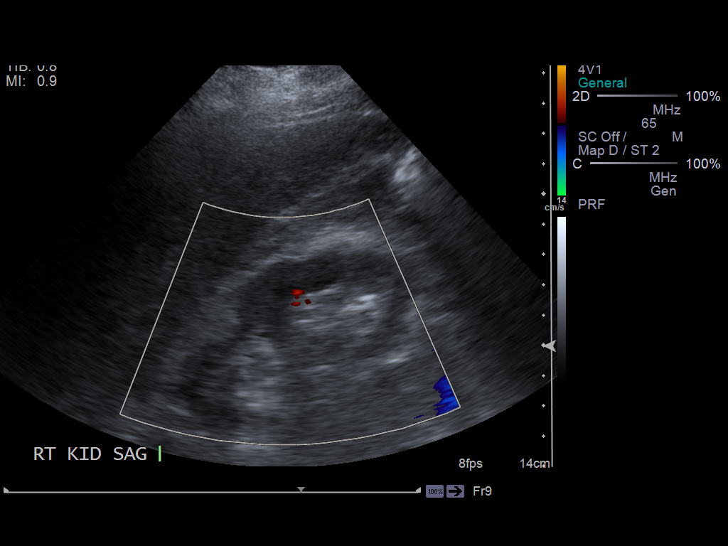
[im 14/42]
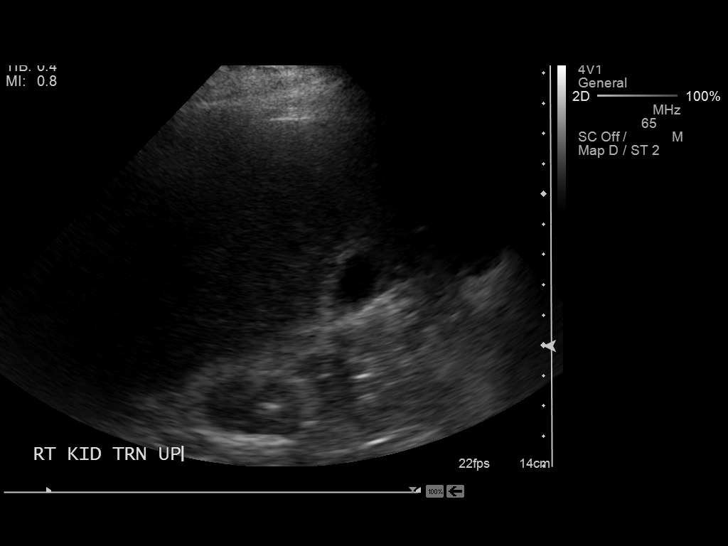
[im 16/42]
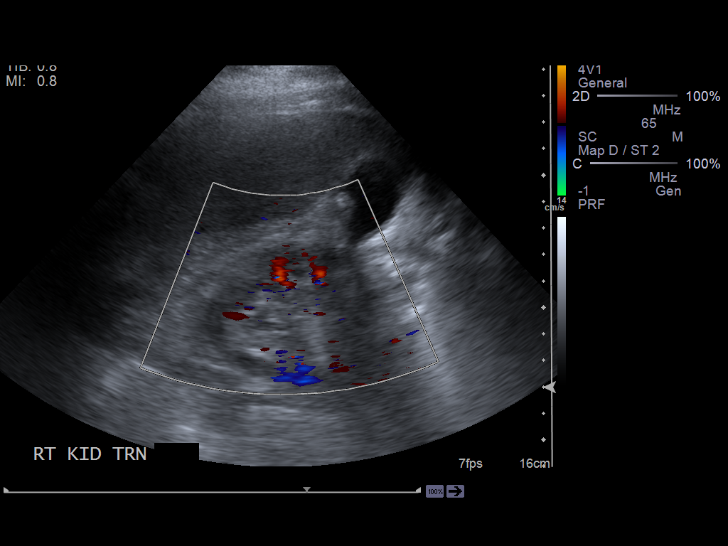
[im 19/42]
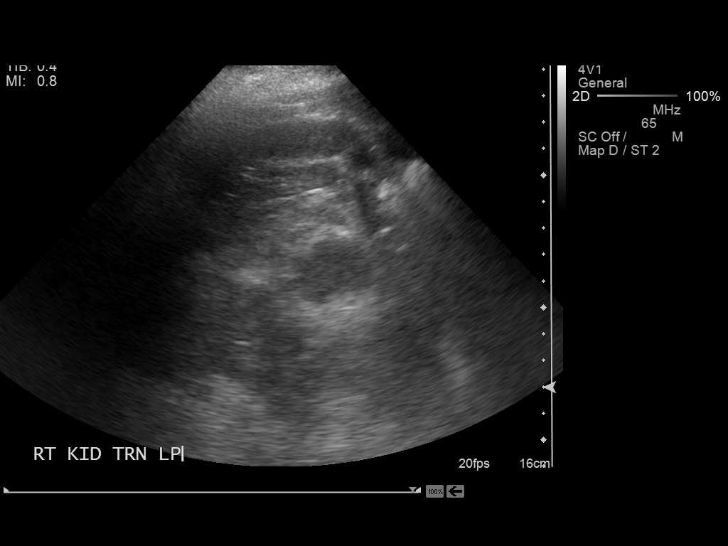
[im 23/42]
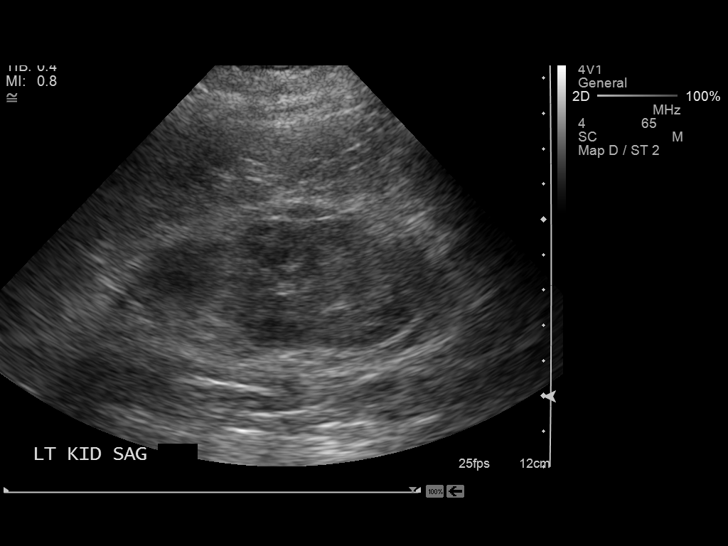
[im 26/42]
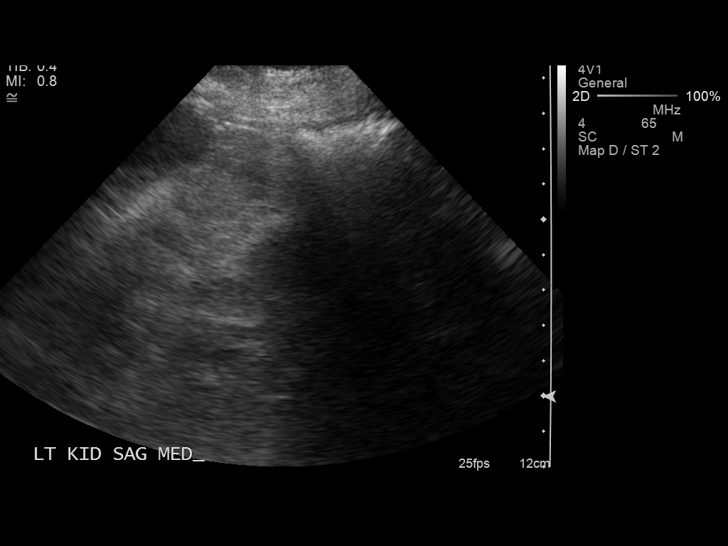
[im 28/42]
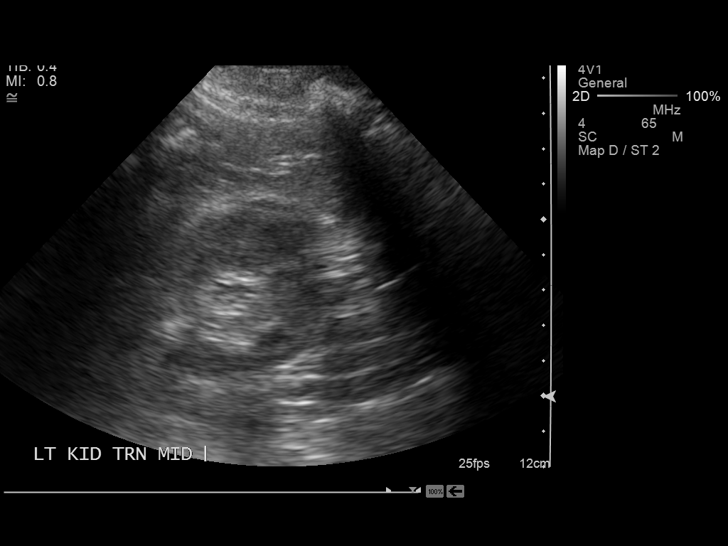
[im 31/42]
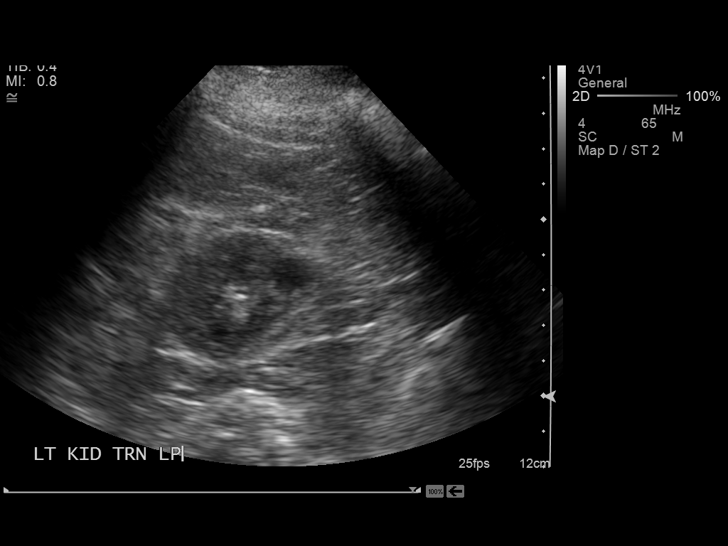
[im 35/42]
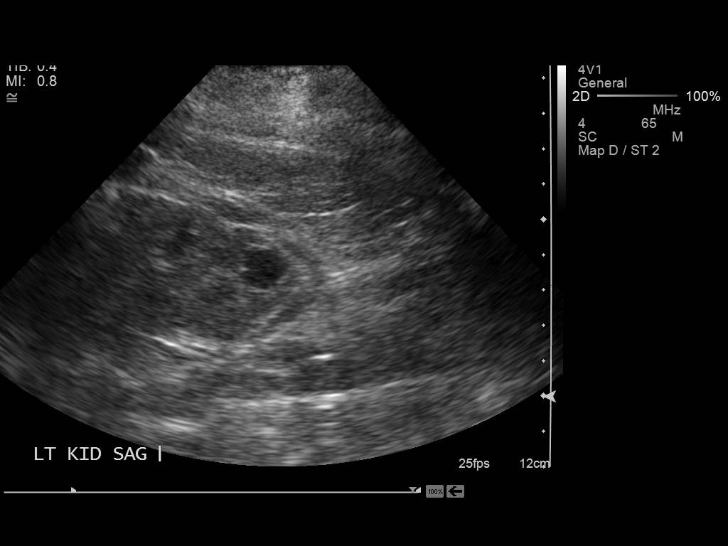
[im 38/42]
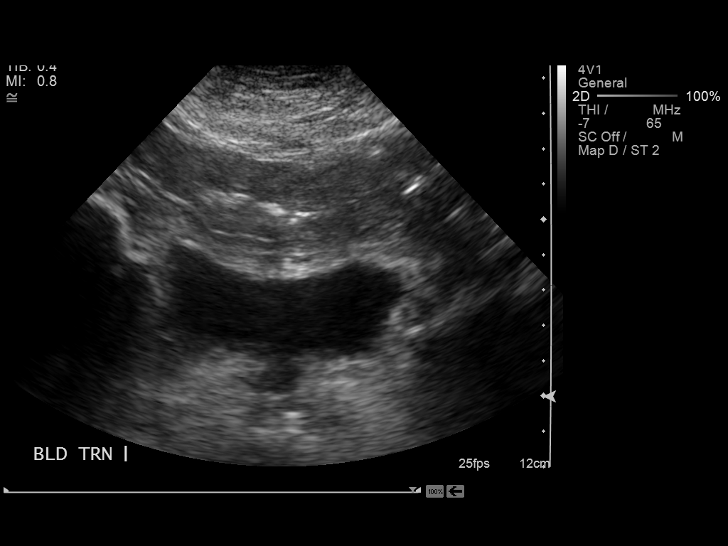
[im 42/42]
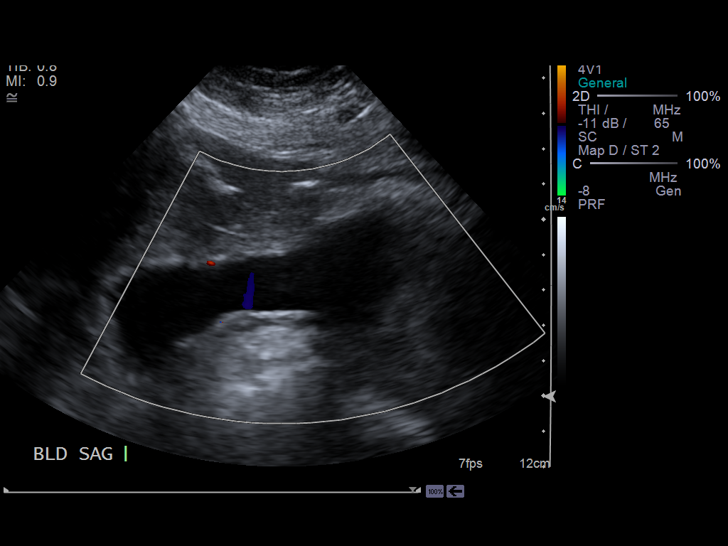

[14 of 25 positions shown; findings below may reference images not displayed]

FINDINGS: Right kidney measures 5.5 cm in length.  There is a
echogenic focus with shadowing mid to lower pole of the right
kidney measures 6.6 mm probable nonobstructive calculus.  No
hydronephrosis.

Left kidney measures 10.2 cm in length.  No hydronephrosis or
diagnostic renal calculus.  A cyst in lower pole measures 1.4 x
cm.

Visualized urinary bladder is unremarkable.
IMPRESSION: 1.  No hydronephrosis.  There is a cyst in lower pole of the left
kidney measures 1.4 cm.
2.  Probable nonobstructive right renal calculus measures 6.6 mm.

## 2014-12-06 IMAGING — CR DG ABDOMEN 1V
2 series · 2 of 2 positions shown · non-contrast
Comparison: CT abdomen and pelvis 02/21/2012.

CLINICAL DATA: Urinary tract stone.

ABDOMEN - 1 VIEW

[view not recorded (1 of 2)]
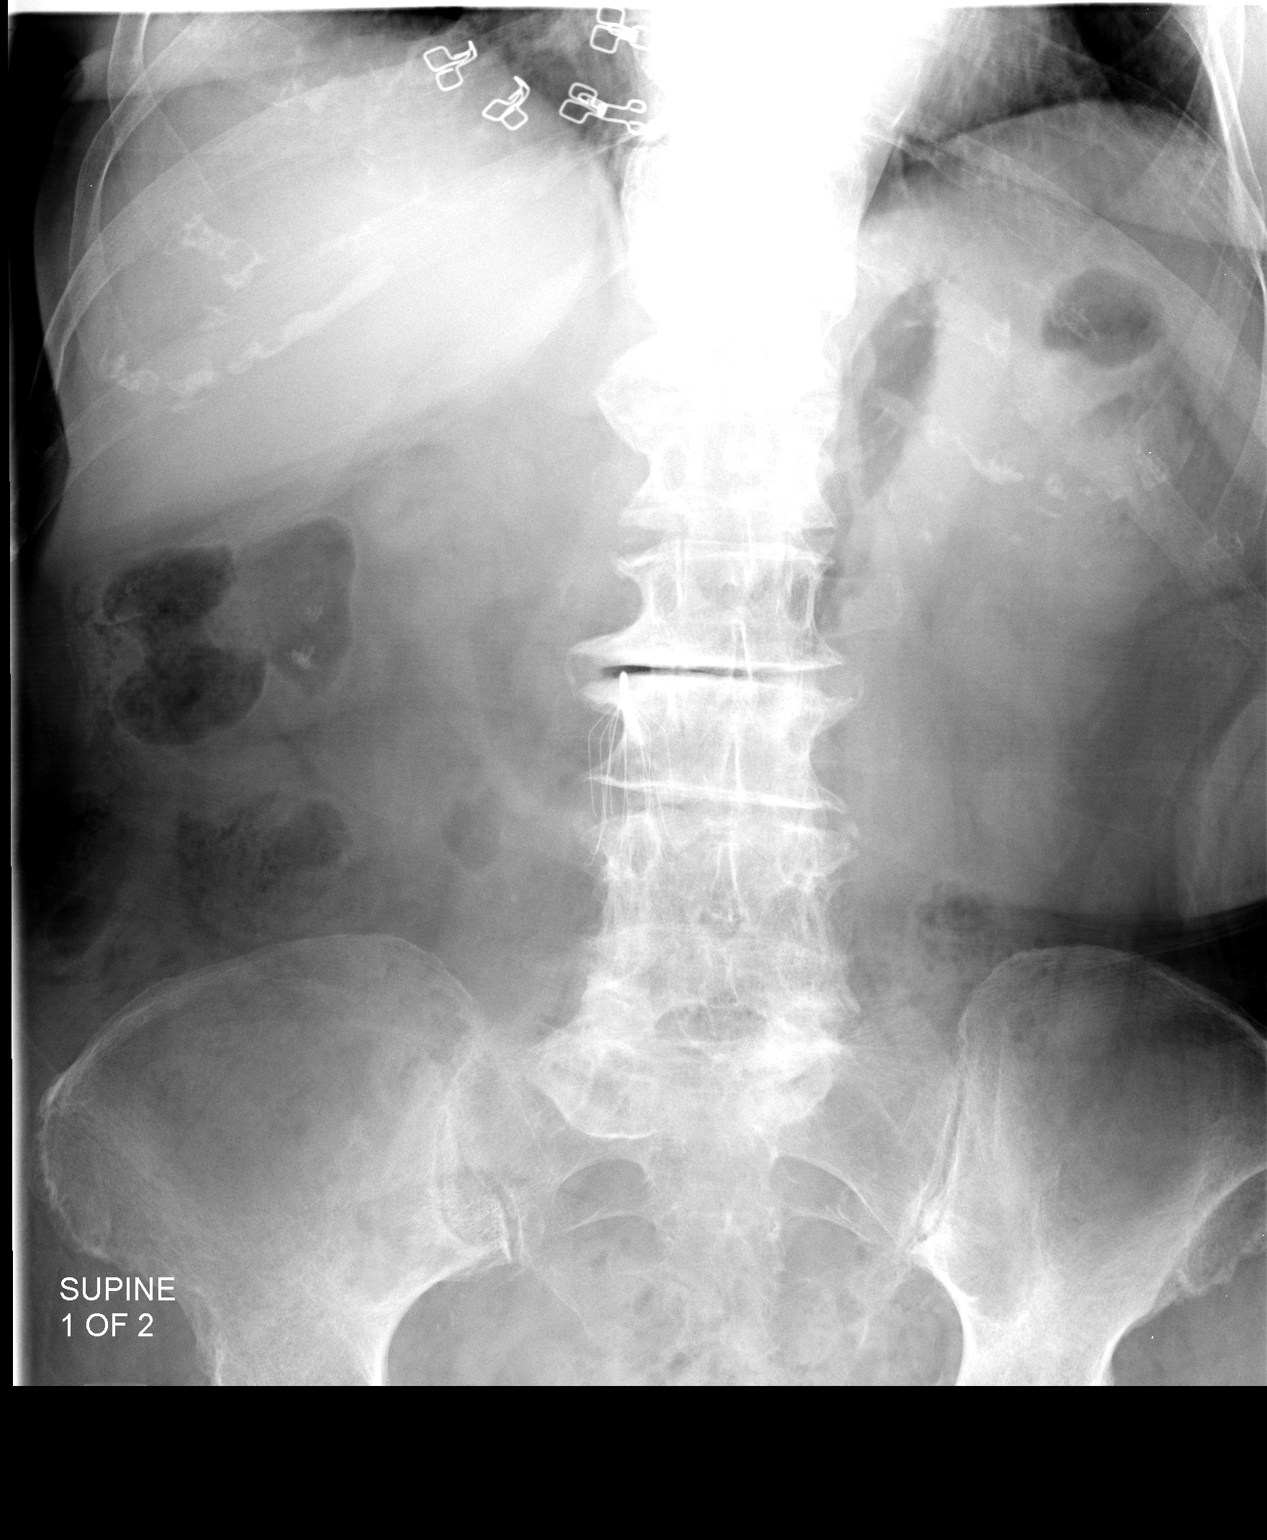

[view not recorded (2 of 2)]
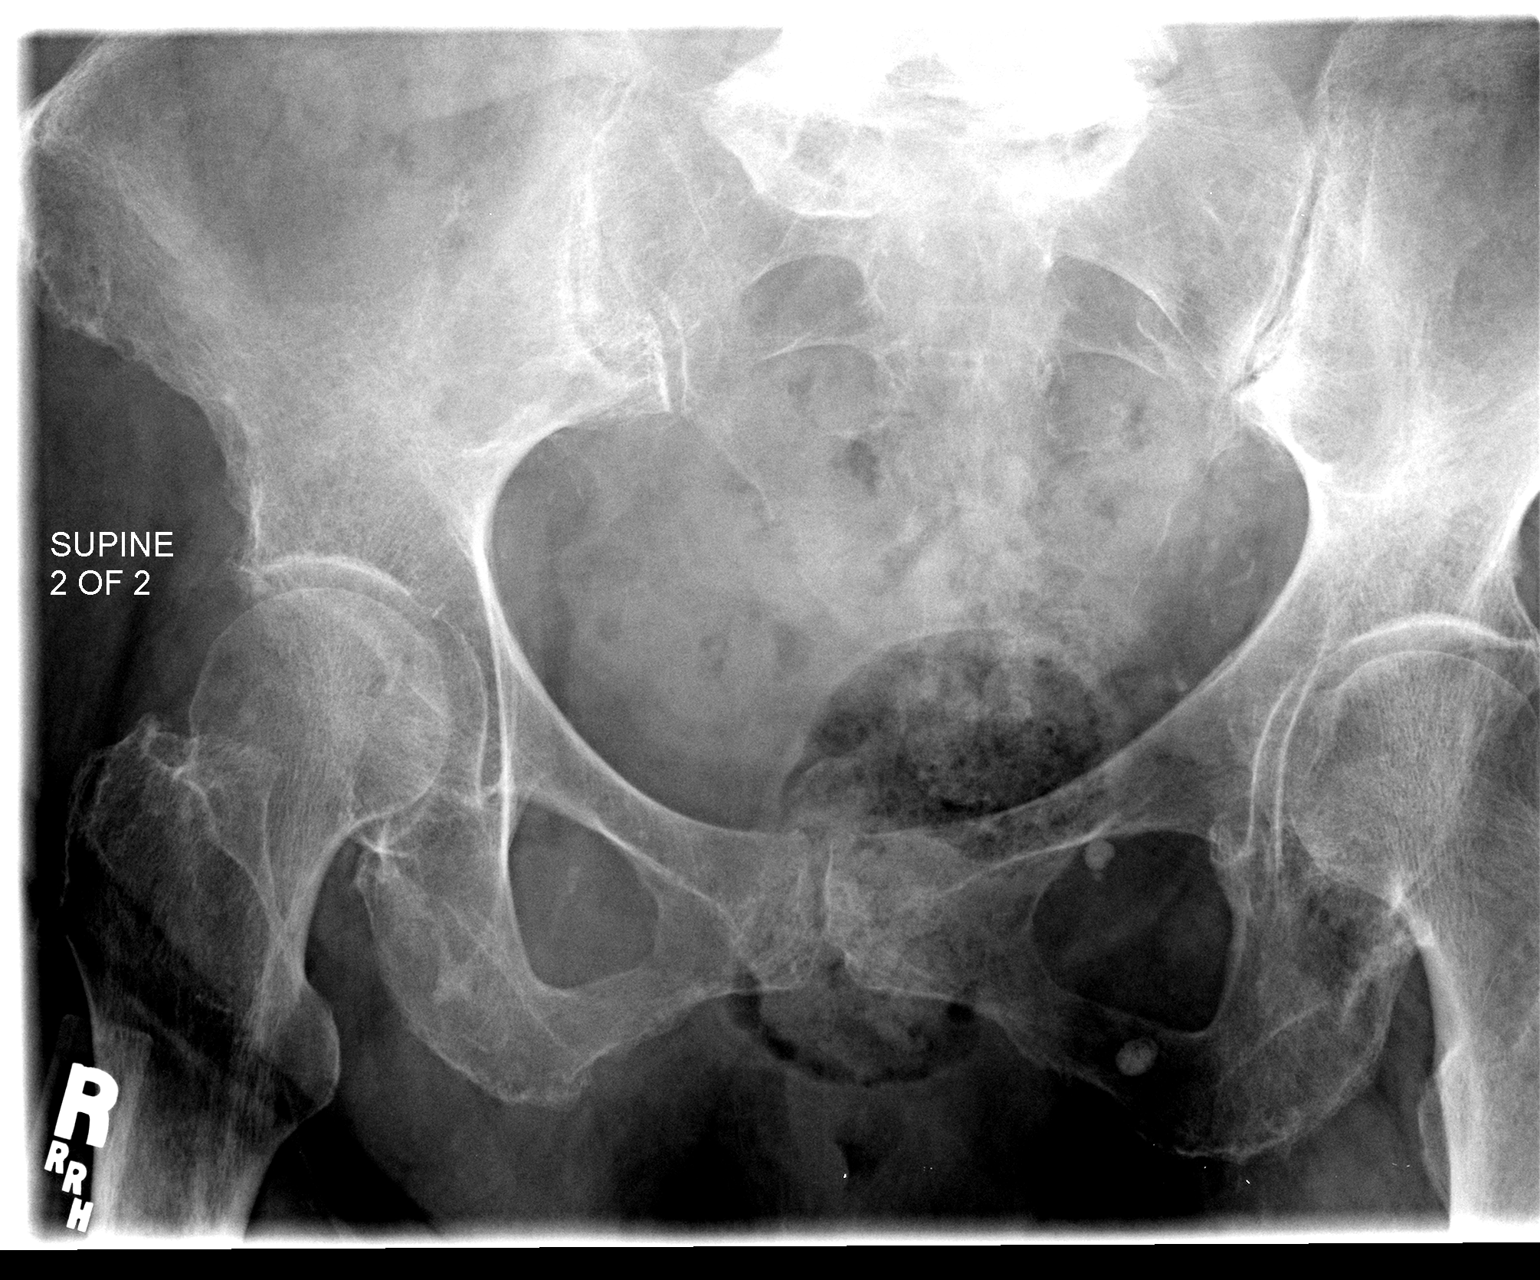

[2 of 2 positions shown; findings below may reference images not displayed]

FINDINGS: Two small stones in the right kidney seen on the CT scan
are not visible on plain films.  A tiny calcification projecting
just below the right L5 transverse process correlates with an
ovarian vein phlebolith seen on the prior CT.  IVC filter is in
place.  Severe lumbar degenerative change is noted.  Bowel gas
pattern is unremarkable.
IMPRESSION: Negative for plain film evidence of urinary tract stones.

## 2014-12-12 IMAGING — CT CT CERVICAL SPINE W/O CM
3 of 7 series · 10 of 33 positions shown, 12 images · non-contrast
Comparison: CT head and cervical spine 07/05/2011

CT HEAD

CLINICAL DATA: Laceration to the back of the head after fall.

CT HEAD WITHOUT CONTRAST
CT CERVICAL SPINE WITHOUT CONTRAST
TECHNIQUE: Multidetector CT imaging of the head and cervical spine
was performed following the standard protocol without intravenous
contrast.  Multiplanar CT image reconstructions of the cervical
spine were also generated.

[Series 7: cervical st 2.0 b31s · axial · 0.26mm/px · z∈[+96,+184]mm · 2 of 90 slices shown, 3 images]
[im 23/90  soft-tissue]
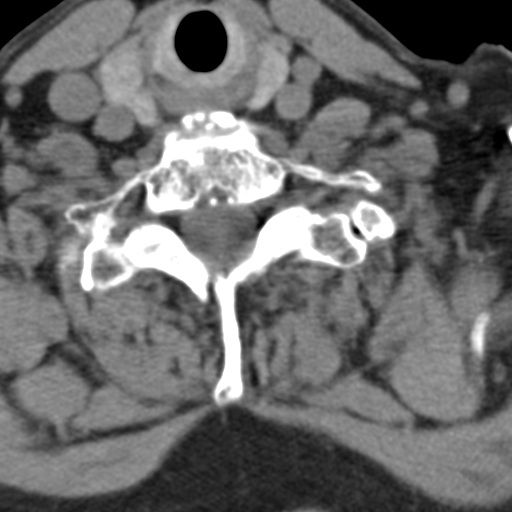
[im 23/90  bone]
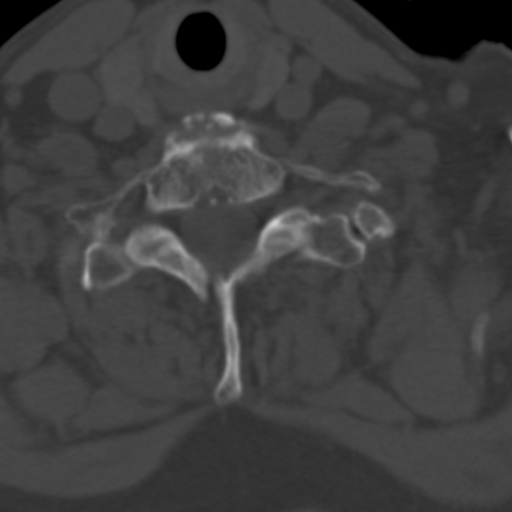
[im 67/90  bone]
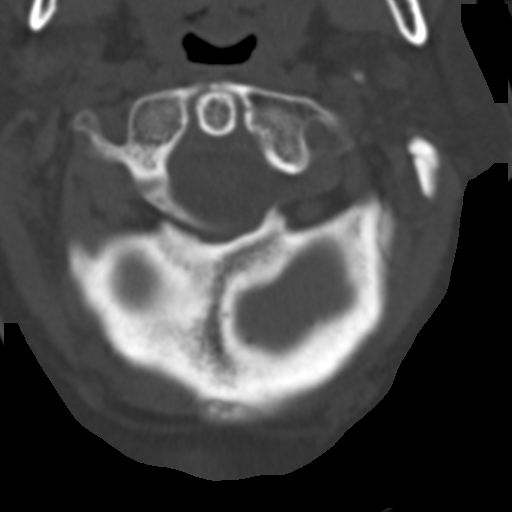

[Series 9: sagittal bone 2.0 · sagittal · 0.21mm/px · 5 of 60 slices shown, 6 images]
[im 20/60  bone]
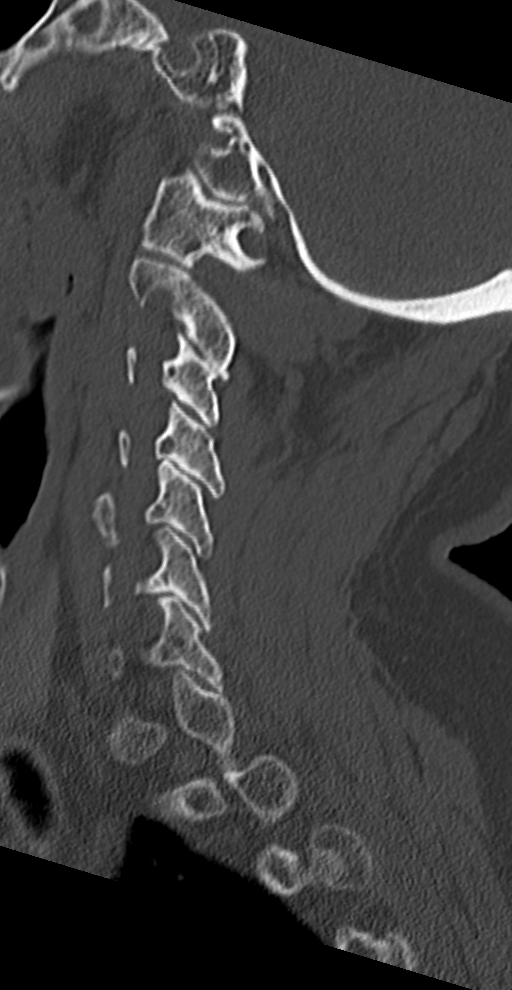
[im 25/60  bone]
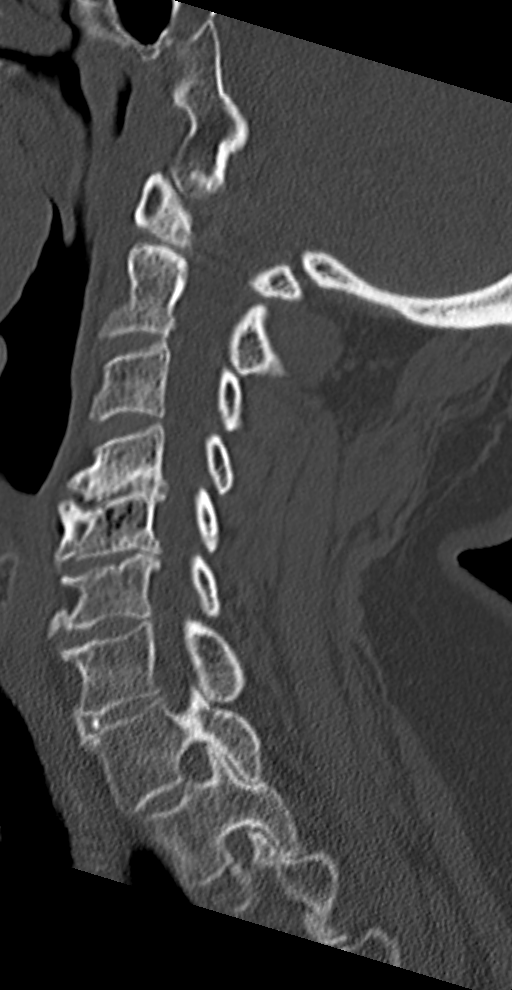
[im 30/60  soft-tissue]
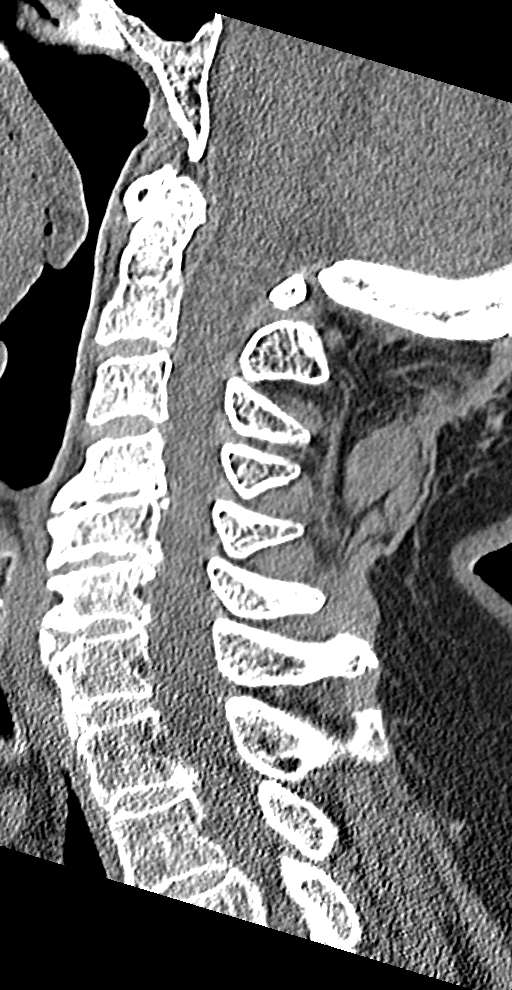
[im 30/60  bone]
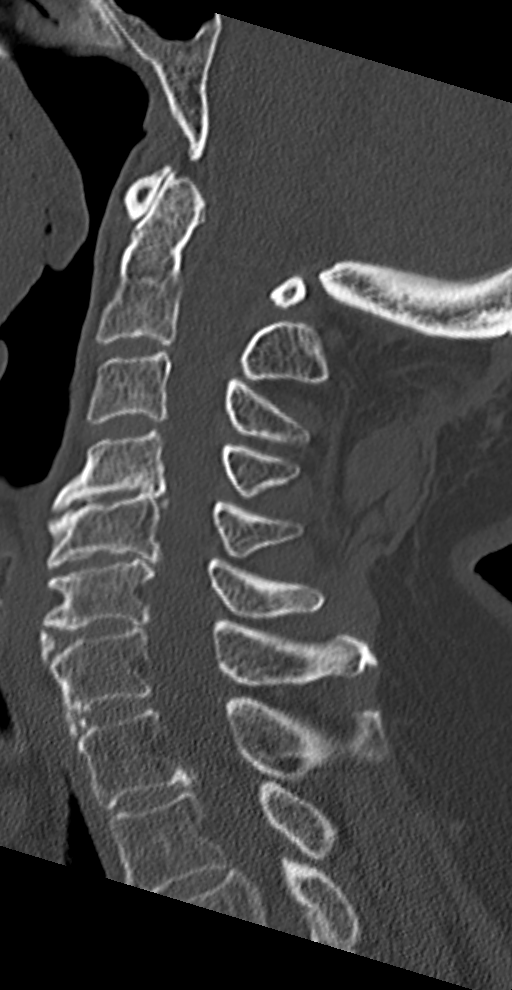
[im 35/60  bone]
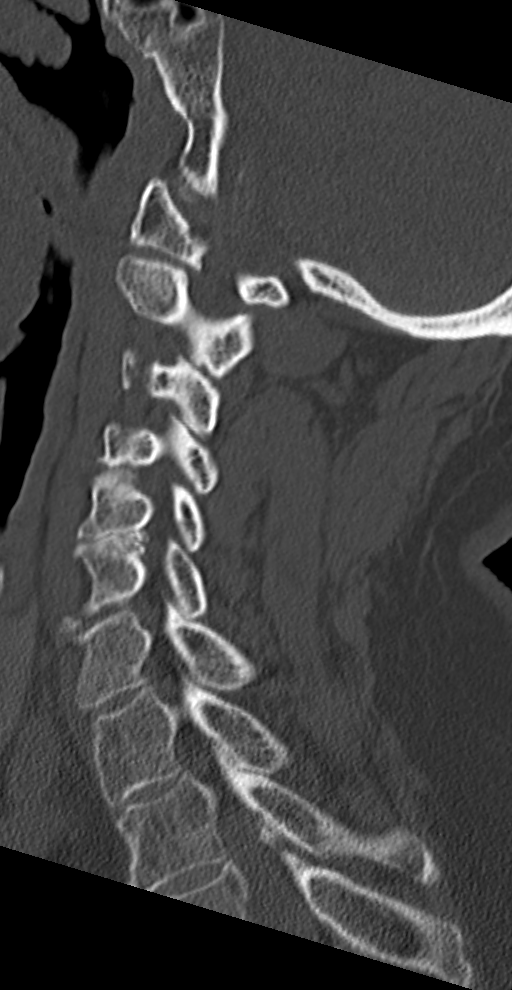
[im 40/60  bone]
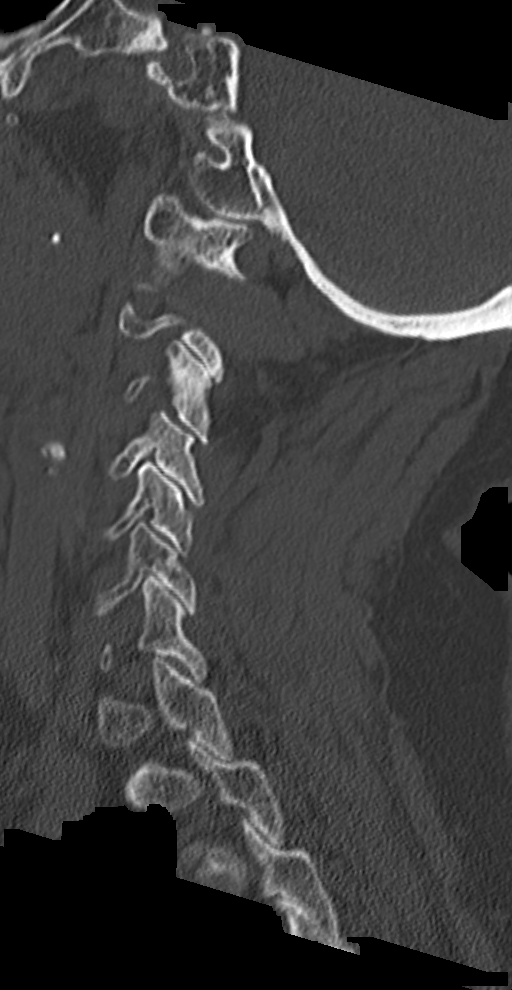

[Series 10: coronal bone 2.0 · coronal · 0.25mm/px · 3 of 52 slices shown]
[im 11/52  bone]
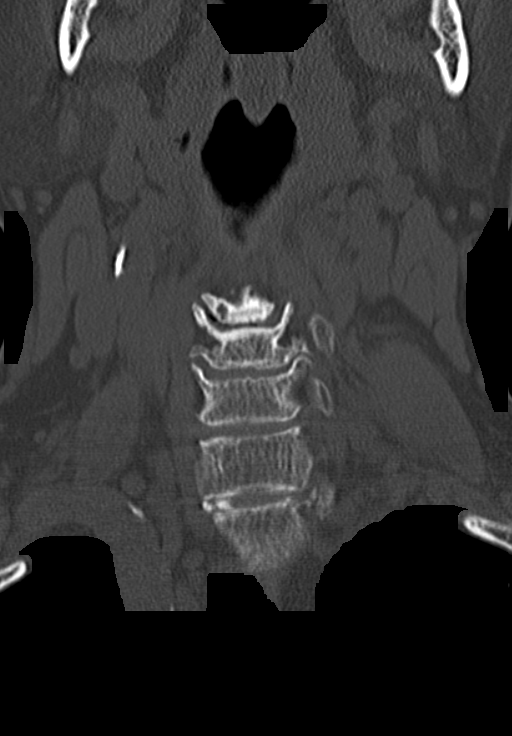
[im 21/52  bone]
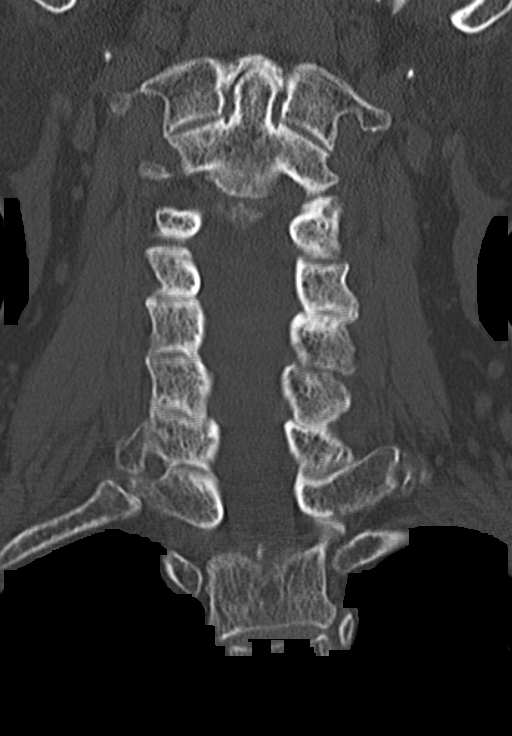
[im 31/52  bone]
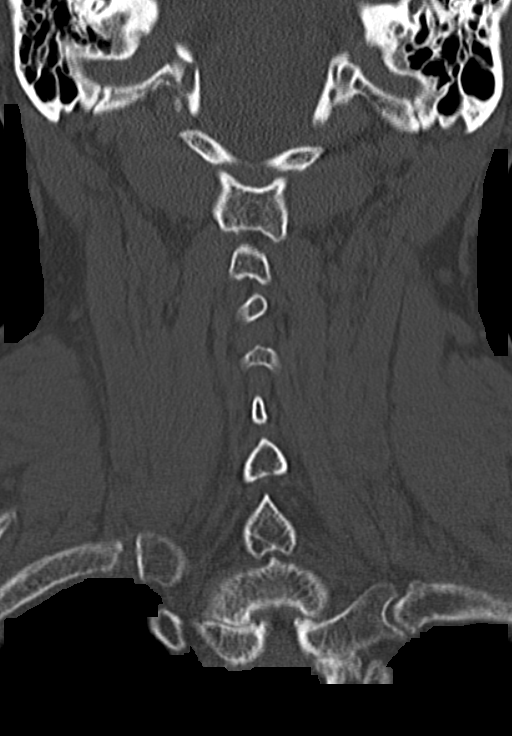

[10 of 33 positions shown; findings below may reference images not displayed]

FINDINGS: Motion artifact in the posterior fossa.  Mild cerebral
atrophy.  Mild ventricular dilatation consistent with central
atrophy.  Low attenuation change in the deep white matter
consistent with small vessel ischemia.  Age indeterminate lacunar
infarct in the left thalamus.  No mass effect or midline shift.  No
abnormal extra-axial fluid collections.  Gray-white matter
junctions are distinct.  Basal cisterns are not effaced.  No
evidence of acute intracranial hemorrhage.  No depressed skull
fractures. Mucosal thickening in the ethmoid air cells and frontal
sinuses.  Visualized  mastoid air cells are not opacified.
Vascular calcifications.
IMPRESSION: Age indeterminate lacunar infarct in the left thalamus.  No acute
intracranial hemorrhage or mass effect.

CT CERVICAL SPINE
FINDINGS: Normal alignment of the cervical vertebrae and facet
joints.  Degenerative changes in the cervical spine with narrowed
cervical interspaces and endplate hypertrophic changes from C4-T1.
Degenerative changes in the cervical facet joints.  Lateral masses
of C1 appear symmetrical.  The odontoid process appears intact.  No
vertebral compression deformities.  No focal bone lesion or bone
destruction.  Bone cortex and trabecular architecture appear
intact.  No prevertebral soft tissue swelling.  Vascular
calcifications in the cervical carotid arteries.
IMPRESSION: Degenerative changes in the cervical spine.  No displaced fractures
identified.  Stable appearance since previous study.

## 2015-04-15 ENCOUNTER — Emergency Department (HOSPITAL_COMMUNITY): Payer: Medicare Other

## 2015-04-15 ENCOUNTER — Emergency Department (HOSPITAL_COMMUNITY)
Admission: EM | Admit: 2015-04-15 | Discharge: 2015-04-15 | Disposition: A | Discharge status: Home / Self Care | Payer: Medicare Other | Attending: Emergency Medicine | Admitting: Emergency Medicine

## 2015-04-15 ENCOUNTER — Encounter (HOSPITAL_COMMUNITY): Payer: Self-pay | Admitting: Emergency Medicine

## 2015-04-15 DIAGNOSIS — D649 Anemia, unspecified: Secondary | ICD-10-CM | POA: Insufficient documentation

## 2015-04-15 DIAGNOSIS — F0390 Unspecified dementia without behavioral disturbance: Secondary | ICD-10-CM | POA: Insufficient documentation

## 2015-04-15 DIAGNOSIS — W07XXXA Fall from chair, initial encounter: Secondary | ICD-10-CM | POA: Diagnosis not present

## 2015-04-15 DIAGNOSIS — Z7951 Long term (current) use of inhaled steroids: Secondary | ICD-10-CM | POA: Diagnosis not present

## 2015-04-15 DIAGNOSIS — Y9289 Other specified places as the place of occurrence of the external cause: Secondary | ICD-10-CM | POA: Diagnosis not present

## 2015-04-15 DIAGNOSIS — E119 Type 2 diabetes mellitus without complications: Secondary | ICD-10-CM | POA: Insufficient documentation

## 2015-04-15 DIAGNOSIS — K219 Gastro-esophageal reflux disease without esophagitis: Secondary | ICD-10-CM | POA: Diagnosis not present

## 2015-04-15 DIAGNOSIS — I129 Hypertensive chronic kidney disease with stage 1 through stage 4 chronic kidney disease, or unspecified chronic kidney disease: Secondary | ICD-10-CM | POA: Insufficient documentation

## 2015-04-15 DIAGNOSIS — N189 Chronic kidney disease, unspecified: Secondary | ICD-10-CM | POA: Diagnosis not present

## 2015-04-15 DIAGNOSIS — J449 Chronic obstructive pulmonary disease, unspecified: Secondary | ICD-10-CM | POA: Diagnosis not present

## 2015-04-15 DIAGNOSIS — Z043 Encounter for examination and observation following other accident: Secondary | ICD-10-CM | POA: Insufficient documentation

## 2015-04-15 DIAGNOSIS — Z87442 Personal history of urinary calculi: Secondary | ICD-10-CM | POA: Diagnosis not present

## 2015-04-15 DIAGNOSIS — Z87891 Personal history of nicotine dependence: Secondary | ICD-10-CM | POA: Diagnosis not present

## 2015-04-15 DIAGNOSIS — F419 Anxiety disorder, unspecified: Secondary | ICD-10-CM | POA: Insufficient documentation

## 2015-04-15 DIAGNOSIS — Z792 Long term (current) use of antibiotics: Secondary | ICD-10-CM | POA: Diagnosis not present

## 2015-04-15 DIAGNOSIS — E785 Hyperlipidemia, unspecified: Secondary | ICD-10-CM | POA: Insufficient documentation

## 2015-04-15 DIAGNOSIS — W19XXXA Unspecified fall, initial encounter: Secondary | ICD-10-CM

## 2015-04-15 DIAGNOSIS — Y9389 Activity, other specified: Secondary | ICD-10-CM | POA: Insufficient documentation

## 2015-04-15 DIAGNOSIS — Y998 Other external cause status: Secondary | ICD-10-CM | POA: Diagnosis not present

## 2015-04-15 DIAGNOSIS — F329 Major depressive disorder, single episode, unspecified: Secondary | ICD-10-CM | POA: Insufficient documentation

## 2015-04-15 DIAGNOSIS — Z79899 Other long term (current) drug therapy: Secondary | ICD-10-CM | POA: Insufficient documentation

## 2015-04-15 NOTE — ED Notes (Signed)
EMS called for transport of pt.

## 2015-04-15 NOTE — ED Provider Notes (Signed)
CSN: [id number]     Arrival date & time 04/15/15  1729 History   First MD Initiated Contact with Patient 04/15/15 1737     Chief Complaint  Patient presents with  . Fall     (Consider location/radiation/quality/duration/timing/severity/associated sxs/prior Treatment) Patient is a 81 y.o. female presenting with fall. The history is provided by the EMS personnel (Patient fell out of the chair and hit her head no loss of consciousness).  Fall This is a new problem. The current episode started 3 to 5 hours ago. The problem occurs constantly. The problem has not changed since onset.Pertinent negatives include no chest pain. Nothing aggravates the symptoms. Nothing relieves the symptoms.    Past Medical History  Diagnosis Date  . Chronic kidney disease     acute renal failure  . COPD (chronic obstructive pulmonary disease) ([place])   . Diabetes mellitus   . Depression   . DEMENTIA   . Hypertension   . Shortness of breath   . GERD (gastroesophageal reflux disease)   . Gallstones   . Diverticulosis   . Concern about mental disorder without diagnosis   . Dehydration   . Anxiety   . Dysphagia   . Anemia   . Cholelithiases   . Renal stones   . Hyperlipidemia   . Hypokalemia   . Dementia    Past Surgical History  Procedure Laterality Date  . Abdominal hysterectomy    . Cystoscopy  2008    with stent placement  . Extracorporeal shock wave lithotripsy  03/13/2011    Procedure: EXTRACORPOREAL SHOCK WAVE LITHOTRIPSY (ESWL);  Surgeon: Marissa Nestle;  Location: [facility];  Service: Urology;  Laterality: Right;  Right Renal Calculus  . Cystoscopy with litholapaxy  03/28/2011    Procedure: CYSTOSCOPY WITH LITHOLAPAXY;  Surgeon: Marissa Nestle;  Location: [facility];  Service: Urology;  Laterality: Right;  . Cystoscopy w/ ureteral stent placement  03/28/2011    Procedure: CYSTOSCOPY WITH RETROGRADE PYELOGRAM/URETERAL STENT PLACEMENT;  Surgeon: Marissa Nestle;  Location: [facility];  Service:  Urology;  Laterality: Right;  . Extracorporeal shock wave lithotripsy  09/25/2011    Procedure: EXTRACORPOREAL SHOCK WAVE LITHOTRIPSY (ESWL);  Surgeon: Marissa Nestle, MD;  Location: [facility];  Service: Urology;  Laterality: Right;  ESWL Right Renal Calculus  . Esophagogastroduodenoscopy  03/19/2005    [id number] antral gastritis, need to rule out H pylori infection/Pyloric channel ulcer without stigmata of bleed/Bulbar ulcer with duodenitis without stigmata of bleed  . Esophagogastroduodenoscopy N/A 09/19/2013    Dr. Oneida Alar: distal gastritis, probable proximal esophageal web s/p Savary dilation , gastric polyp, chronic gastritis  . Esophageal dilation  09/19/2013    Procedure: ESOPHAGEAL DILATION;  Surgeon: Danie Binder, MD;  Location: [facility];  Service: Endoscopy;;   Family History  Problem Relation Age of Onset  . Anesthesia problems Neg Hx   . Hypotension Neg Hx   . Malignant hyperthermia Neg Hx   . Pseudochol deficiency Neg Hx    Social History  Substance Use Topics  . Smoking status: Former Smoker -- 0.50 packs/day for 30 years    Types: Cigarettes    Quit date: 03/25/2003  . Smokeless tobacco: None  . Alcohol Use: No   OB History    No data available     Review of Systems  Unable to perform ROS: Dementia  Cardiovascular: Negative for chest pain.      Allergies  Review of patient's allergies indicates no known allergies.  Home Medications  Prior to Admission medications   Medication Sig Start Date End Date Taking? Authorizing Provider  acetaminophen (TYLENOL) 500 MG tablet Take 500 mg by mouth 2 (two) times daily.   Yes Historical Provider, MD  ALPRAZolam Duanne Moron) 0.5 MG tablet Take 0.5 mg by mouth at bedtime as needed for anxiety.   Yes Historical Provider, MD  ALPRAZolam Duanne Moron) 0.5 MG tablet Take 1 mg by mouth 2 (two) times daily.   Yes Historical Provider, MD  amLODipine (NORVASC) 5 MG tablet Take 5 mg by mouth daily.   Yes Historical Provider, MD   atorvastatin (LIPITOR) 10 MG tablet Take 10 mg by mouth daily.   Yes Historical Provider, MD  Calcium Carbonate-Vitamin D (CALCARB 600/D) 600-400 MG-UNIT per tablet Take 1 tablet by mouth daily.    Yes Historical Provider, MD  escitalopram (LEXAPRO) 5 MG tablet Take 15 mg by mouth daily.   Yes Historical Provider, MD  ferrous sulfate 325 (65 FE) MG tablet Take 325 mg by mouth every morning.   Yes Historical Provider, MD  fluticasone (FLONASE) 50 MCG/ACT nasal spray Place 2 sprays into the nose daily. allergies    Yes Historical Provider, MD  guaiFENesin (MUCINEX) 600 MG 12 hr tablet Take 600 mg by mouth 3 (three) times daily.   Yes Historical Provider, MD  lactulose (CHRONULAC) 10 GM/15ML solution Take 20 g by mouth every other day.   Yes Historical Provider, MD  Melatonin 3 MG TABS Take 1 tablet by mouth at bedtime.   Yes Historical Provider, MD  Omega-3 Fatty Acids (FISH OIL) 1000 MG CAPS Take 1 capsule by mouth daily.   Yes Historical Provider, MD  omeprazole (PRILOSEC) 20 MG capsule Take 20 mg by mouth daily.   Yes Historical Provider, MD  pantoprazole (PROTONIX) 40 MG tablet Take 1 tablet (40 mg total) by mouth daily before breakfast. 04/16/13  Yes Mahala Menghini, PA-C  polyethylene glycol powder (GLYCOLAX/MIRALAX) powder Take 17 g by mouth daily.   Yes Historical Provider, MD  QUEtiapine (SEROQUEL) 200 MG tablet Take 200 mg by mouth at bedtime.    Yes Historical Provider, MD  QUEtiapine Fumarate (SEROQUEL XR) 150 MG 24 hr tablet Take 150 mg by mouth every morning.   Yes Historical Provider, MD  sennosides-docusate sodium (SENOKOT-S) 8.6-50 MG tablet Take 2 tablets by mouth 2 (two) times daily.   Yes Historical Provider, MD  vitamin B-12 (CYANOCOBALAMIN) 1000 MCG tablet Take 1,000 mcg by mouth every morning.    Yes Historical Provider, MD  amoxicillin-clavulanate (AUGMENTIN) 500-125 MG per tablet Take 1 tablet (500 mg total) by mouth 3 (three) times daily. 07/25/14   Rosita Fire, MD  feeding  supplement, RESOURCE BREEZE, (RESOURCE BREEZE) LIQD Take 1 Container by mouth 3 (three) times daily between meals. 09/21/13   Rosita Fire, MD  HYDROcodone-acetaminophen (NORCO/VICODIN) 5-325 MG per tablet Take 1 tablet by mouth every 6 (six) hours as needed for moderate pain. pain Patient taking differently: Take 1 tablet by mouth every morning. May also take every 6 hours as needed for pain 11/01/13   Rosita Fire, MD  Vitamin D, Ergocalciferol, (DRISDOL) 50000 UNITS CAPS capsule Take 50,000 Units by mouth every 30 (thirty) days.    Historical Provider, MD   BP 154/89 mmHg  Pulse 76  Temp(Src) 97.9 F (36.6 C) (Oral)  Resp 18  SpO2 100% Physical Exam  Constitutional: She appears well-developed.  HENT:  Head: Normocephalic.  Eyes: Conjunctivae and EOM are normal. No scleral icterus.  Neck: Neck supple. No  thyromegaly present.  Cardiovascular: Normal rate and regular rhythm.  Exam reveals no gallop and no friction rub.   No murmur heard. Pulmonary/Chest: No stridor. She has no wheezes. She has no rales. She exhibits no tenderness.  Abdominal: She exhibits no distension. There is no tenderness. There is no rebound.  Musculoskeletal: Normal range of motion. She exhibits no edema.  Lymphadenopathy:    She has no cervical adenopathy.  Neurological: She is alert. She exhibits normal muscle tone. Coordination normal.  Oriented to person only  Skin: No rash noted. No erythema.  Psychiatric: She has a normal mood and affect. Her behavior is normal.    ED Course  Procedures (including critical care time) Labs Review Labs Reviewed - No data to display  Imaging Review Ct Head Wo Contrast  04/15/2015  CLINICAL DATA:  Fall.  Head trauma.  Dementia. EXAM: CT HEAD WITHOUT CONTRAST CT CERVICAL SPINE WITHOUT CONTRAST TECHNIQUE: Multidetector CT imaging of the head and cervical spine was performed following the standard protocol without intravenous contrast. Multiplanar CT image reconstructions of  the cervical spine were also generated. COMPARISON:  07/21/2014 head CT. FINDINGS: CT HEAD FINDINGS No evidence of parenchymal hemorrhage or extra-axial fluid collection. No mass lesion, mass effect, or midline shift. No CT evidence of acute infarction. There is intracranial atherosclerosis. There is a stable focal hypodensity in the left basal ganglia, likely a remote lacunar infarct. Diffuse cerebral volume loss. Nonspecific stable subcortical and periventricular white matter hypodensity, most in keeping with chronic small vessel ischemic change. No ventriculomegaly. There is moderate right and mild left maxillary sinus mucosal thickening and minimal layering frothy secretions. There is partial opacification of the ethmoidal cells bilaterally. There is partial opacification of the left sphenoid sinus. The mastoid air cells are unopacified. No evidence of calvarial fracture. CT CERVICAL SPINE FINDINGS No fracture is detected in the cervical spine. No prevertebral soft tissue swelling. There is a normal cervical lordosis. Dens is well positioned between the lateral masses of C1. The lateral masses appear well-aligned. Moderate to severe degenerative disc disease in the mid to lower cervical spine, most prominent at C4-5. Mild bilateral facet arthropathy. Mild foraminal stenosis on the right at C4-5. There is partial effacement of the anterior cervical canal at C4-5 and C5-6 due to posterior disc osteophyte complexes. No cervical spine subluxation. Visualized mastoid air cells appear clear. No evidence of intra-axial hemorrhage in the visualized brain. No gross cervical canal hematoma. No significant pulmonary nodules at the visualized lung apices. No cervical adenopathy or other significant neck soft tissue abnormality. IMPRESSION: 1. No evidence of acute intracranial abnormality. No calvarial fracture. 2. Intracranial atherosclerosis, diffuse cerebral volume loss, stable old left basal ganglia lacunar infarct and  chronic small vessel ischemic white matter change. 3. No cervical spine fracture or subluxation. 4. Moderate to severe degenerative disc disease in the cervical spine as described. 5. Paranasal sinusitis, likely chronic. Electronically Signed   By: Ilona Sorrel M.D.   On: 04/15/2015 18:51   Ct Cervical Spine Wo Contrast  04/15/2015  CLINICAL DATA:  Fall.  Head trauma.  Dementia. EXAM: CT HEAD WITHOUT CONTRAST CT CERVICAL SPINE WITHOUT CONTRAST TECHNIQUE: Multidetector CT imaging of the head and cervical spine was performed following the standard protocol without intravenous contrast. Multiplanar CT image reconstructions of the cervical spine were also generated. COMPARISON:  07/21/2014 head CT. FINDINGS: CT HEAD FINDINGS No evidence of parenchymal hemorrhage or extra-axial fluid collection. No mass lesion, mass effect, or midline shift. No CT evidence of  acute infarction. There is intracranial atherosclerosis. There is a stable focal hypodensity in the left basal ganglia, likely a remote lacunar infarct. Diffuse cerebral volume loss. Nonspecific stable subcortical and periventricular white matter hypodensity, most in keeping with chronic small vessel ischemic change. No ventriculomegaly. There is moderate right and mild left maxillary sinus mucosal thickening and minimal layering frothy secretions. There is partial opacification of the ethmoidal cells bilaterally. There is partial opacification of the left sphenoid sinus. The mastoid air cells are unopacified. No evidence of calvarial fracture. CT CERVICAL SPINE FINDINGS No fracture is detected in the cervical spine. No prevertebral soft tissue swelling. There is a normal cervical lordosis. Dens is well positioned between the lateral masses of C1. The lateral masses appear well-aligned. Moderate to severe degenerative disc disease in the mid to lower cervical spine, most prominent at C4-5. Mild bilateral facet arthropathy. Mild foraminal stenosis on the right at  C4-5. There is partial effacement of the anterior cervical canal at C4-5 and C5-6 due to posterior disc osteophyte complexes. No cervical spine subluxation. Visualized mastoid air cells appear clear. No evidence of intra-axial hemorrhage in the visualized brain. No gross cervical canal hematoma. No significant pulmonary nodules at the visualized lung apices. No cervical adenopathy or other significant neck soft tissue abnormality. IMPRESSION: 1. No evidence of acute intracranial abnormality. No calvarial fracture. 2. Intracranial atherosclerosis, diffuse cerebral volume loss, stable old left basal ganglia lacunar infarct and chronic small vessel ischemic white matter change. 3. No cervical spine fracture or subluxation. 4. Moderate to severe degenerative disc disease in the cervical spine as described. 5. Paranasal sinusitis, likely chronic. Electronically Signed   By: Ilona Sorrel M.D.   On: 04/15/2015 18:51   I have personally reviewed and evaluated these images and lab results as part of my medical decision-making.   EKG Interpretation None      MDM   Final diagnoses:  Fall, initial encounter  Dementia, without behavioral disturbance    Fall. CT head and cervical spine negative. Patient will be sent back to nursing home. No significant injury to head    Milton Ferguson, MD 04/15/15 1921

## 2015-04-15 NOTE — ED Notes (Signed)
Pt from [facility]. Per EMS pt slid out of a chair and hit her head. No LOC.

## 2015-04-15 NOTE — ED Notes (Signed)
Report called to Alcus Dad, RN.

## 2015-04-15 NOTE — Discharge Instructions (Signed)
Follow up with your md if needed °

## 2015-04-15 NOTE — ED Notes (Signed)
Pt discharged via EMS to [facility].

## 2016-02-08 IMAGING — US US ABDOMEN LIMITED
1 series · 14 of 25 positions shown · non-contrast
Comparison: CT abdomen pelvis dated 09/17/2013

CLINICAL DATA: Abdominal pain, gallstones

EXAM:
US ABDOMEN LIMITED - RIGHT UPPER QUADRANT

[Series 1: us abdomen limited · 0.24mm/px · 14 of 64 slices shown]
[im 1/64]
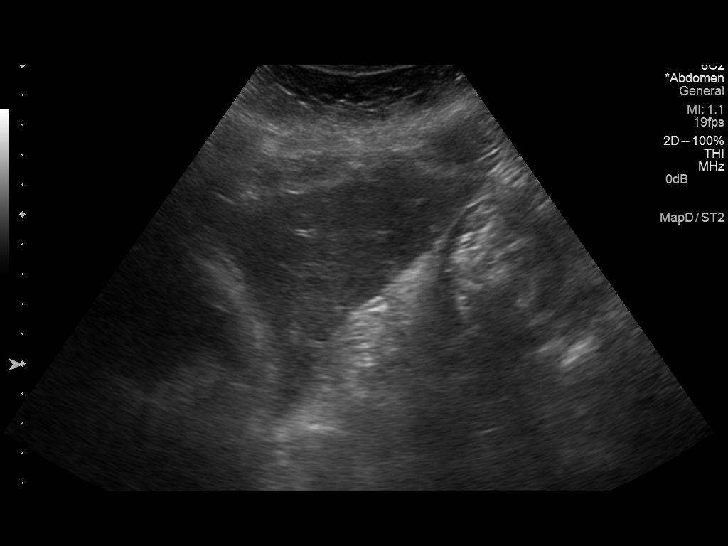
[im 6/64]
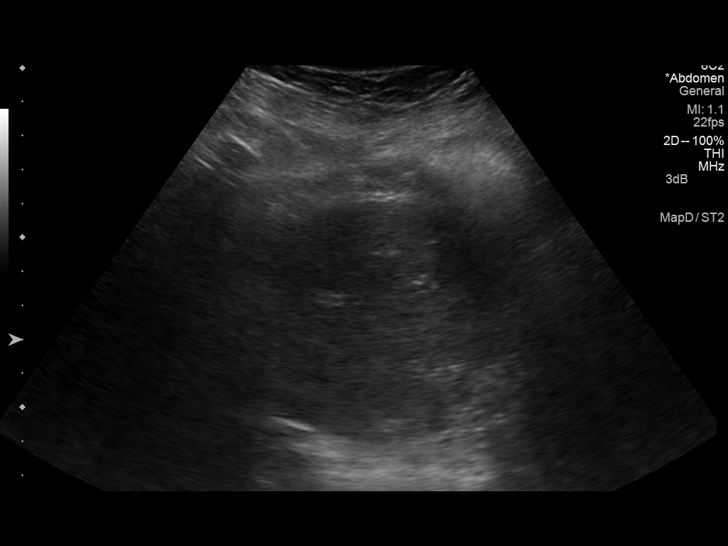
[im 11/64]
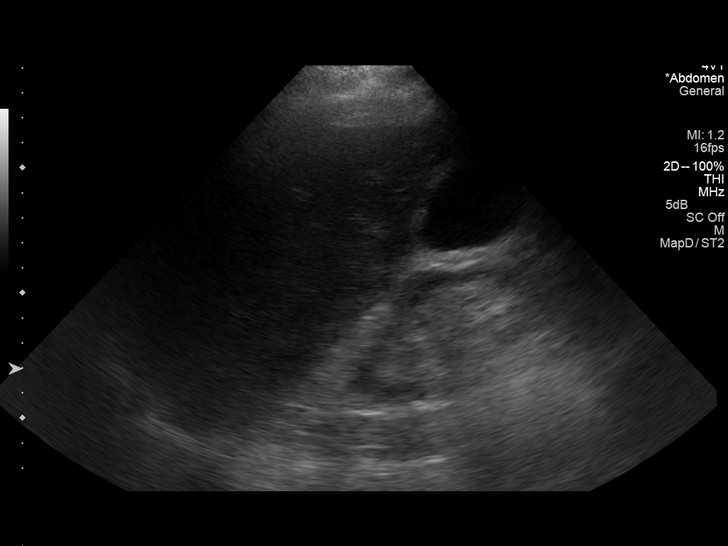
[im 16/64]
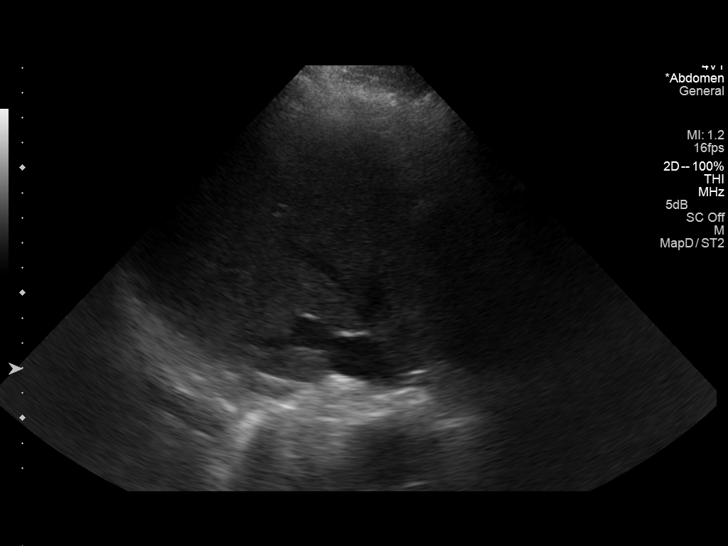
[im 22/64]
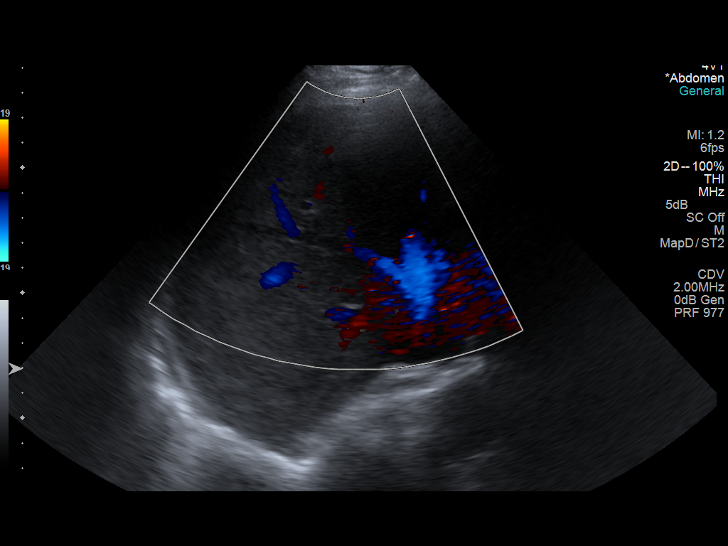
[im 24/64]
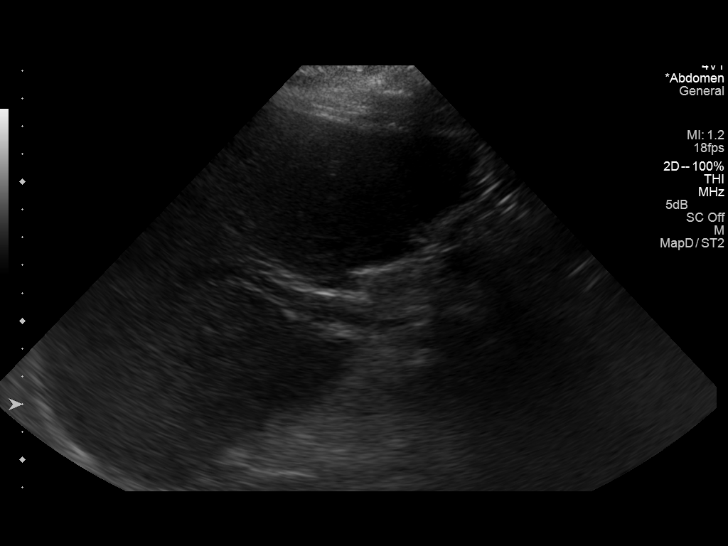
[im 29/64]
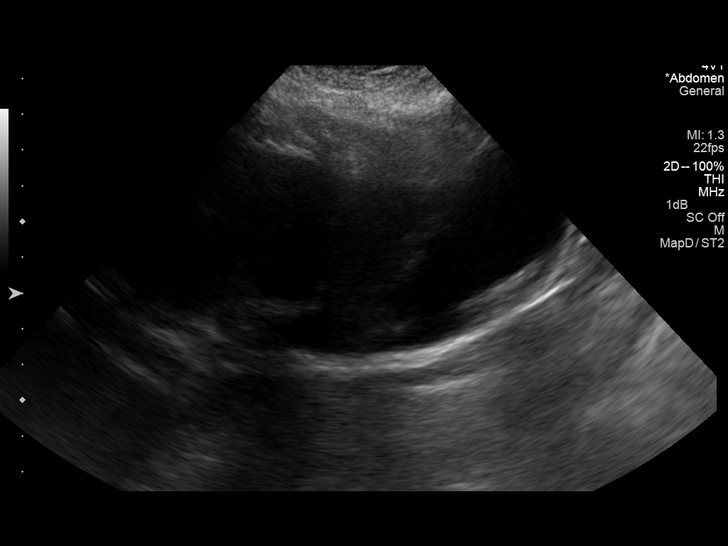
[im 35/64]
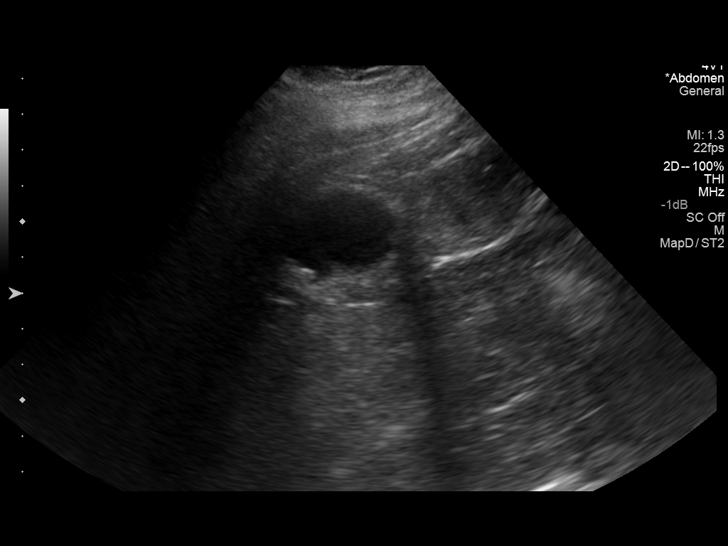
[im 40/64]
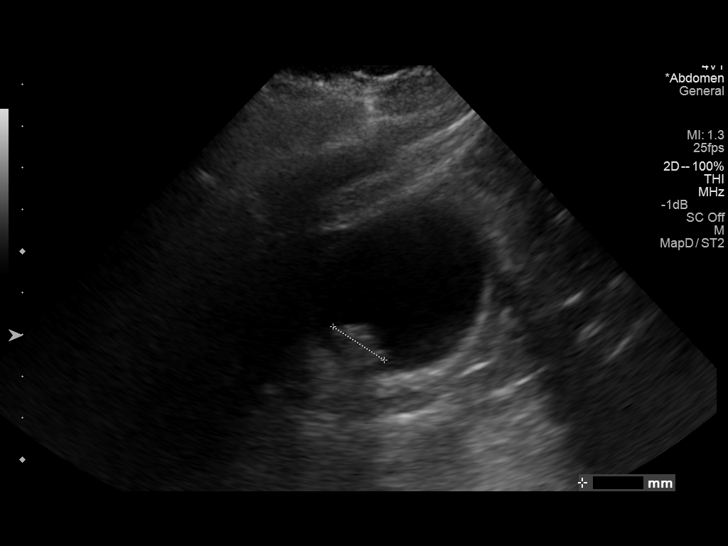
[im 43/64]
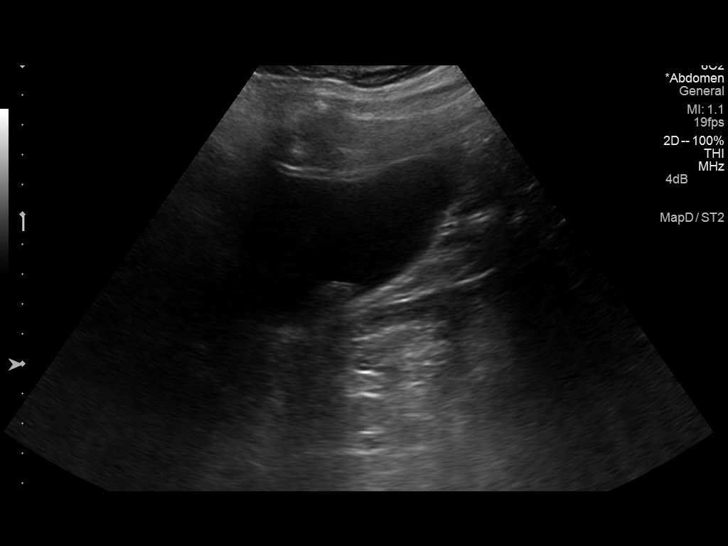
[im 48/64]
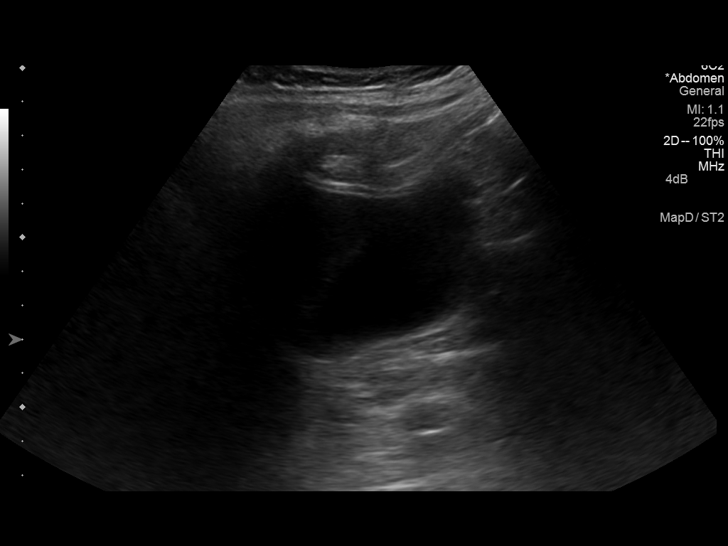
[im 53/64]
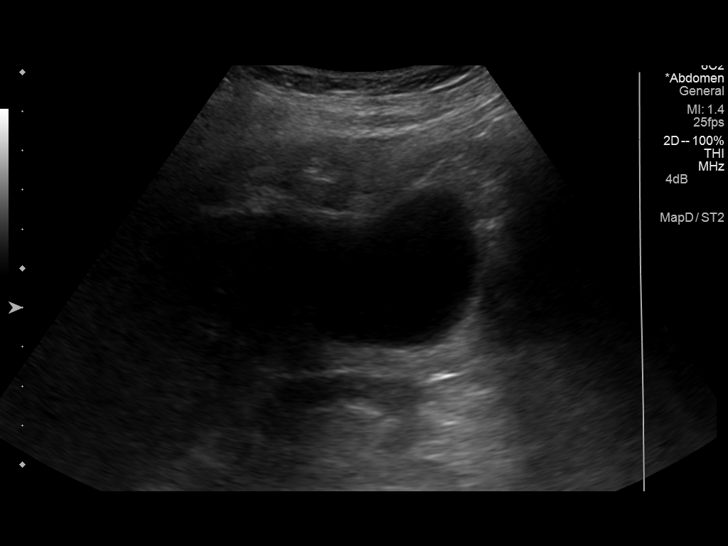
[im 58/64]
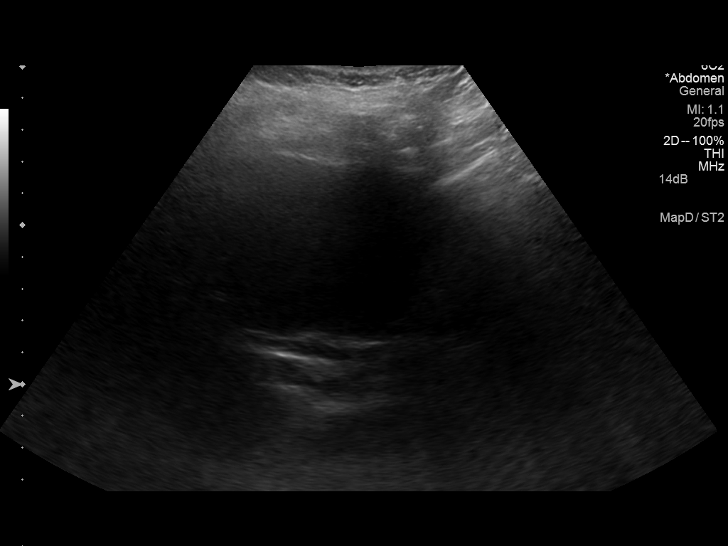
[im 64/64]
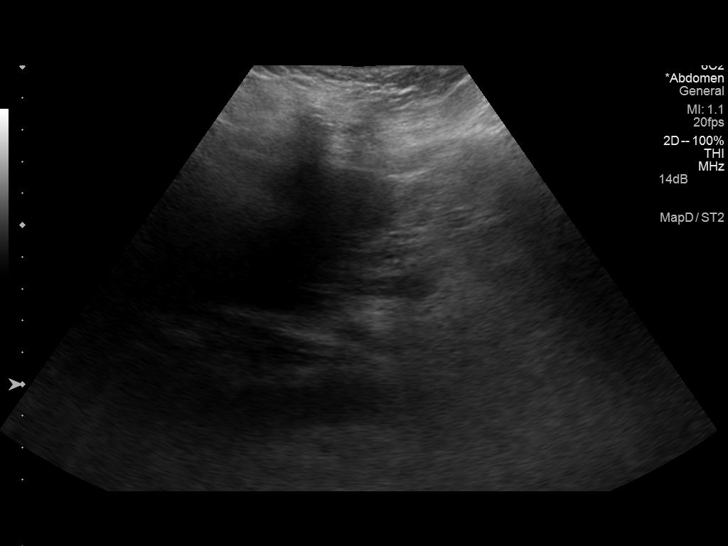

[14 of 25 positions shown; findings below may reference images not displayed]

FINDINGS: Gallbladder:

Multiple gallstones measuring up to 1.5 cm. No gallbladder wall
thickening or pericholecystic fluid. Negative sonographic Murphy's
sign.

Common bile duct:

Diameter: 5 mm.

Liver:

No focal lesion identified. Within normal limits in parenchymal
echogenicity.
IMPRESSION: Cholelithiasis, without associated sonographic findings to suggest
acute cholecystitis.

## 2016-12-25 DEATH — deceased
